# Patient Record
Sex: Female | Born: 1965 | Race: Black or African American | Hispanic: No | Marital: Single | Smoking: Never smoker
Health system: Southern US, Community
[De-identification: ages and names within clinical notes are randomized; demographics above are authoritative.]

## PROBLEM LIST (undated history)

## (undated) DIAGNOSIS — K219 Gastro-esophageal reflux disease without esophagitis: Secondary | ICD-10-CM

## (undated) DIAGNOSIS — I10 Essential (primary) hypertension: Secondary | ICD-10-CM

## (undated) DIAGNOSIS — B029 Zoster without complications: Secondary | ICD-10-CM

## (undated) DIAGNOSIS — E785 Hyperlipidemia, unspecified: Secondary | ICD-10-CM

## (undated) DIAGNOSIS — E119 Type 2 diabetes mellitus without complications: Secondary | ICD-10-CM

## (undated) DIAGNOSIS — T7840XA Allergy, unspecified, initial encounter: Secondary | ICD-10-CM

## (undated) HISTORY — DX: Gastro-esophageal reflux disease without esophagitis: K21.9

## (undated) HISTORY — DX: Essential (primary) hypertension: I10

## (undated) HISTORY — PX: CHOLECYSTECTOMY: SHX55

## (undated) HISTORY — PX: CYST REMOVAL HAND: SHX6279

## (undated) HISTORY — PX: SHOULDER OPEN ROTATOR CUFF REPAIR: SHX2407

## (undated) HISTORY — DX: Hyperlipidemia, unspecified: E78.5

## (undated) HISTORY — PX: LAPAROSCOPIC GASTRIC BANDING: SHX1100

## (undated) HISTORY — DX: Allergy, unspecified, initial encounter: T78.40XA

## (undated) HISTORY — PX: ROTATOR CUFF REPAIR: SHX139

## (undated) HISTORY — PX: HYSTEROTOMY: SHX1776

## (undated) HISTORY — PX: CYST REMOVAL LEG: SHX6280

## (undated) HISTORY — PX: TOTAL ABDOMINAL HYSTERECTOMY: SHX209

## (undated) HISTORY — DX: Zoster without complications: B02.9

## (undated) HISTORY — PX: HERNIA REPAIR: SHX51

## (undated) HISTORY — PX: TUBAL LIGATION: SHX77

---

## 1988-07-16 HISTORY — PX: CHOLECYSTECTOMY: SHX55

## 2008-07-16 HISTORY — PX: LAPAROSCOPIC GASTRIC BANDING: SHX1100

## 2015-07-17 DIAGNOSIS — I639 Cerebral infarction, unspecified: Secondary | ICD-10-CM

## 2015-07-17 HISTORY — DX: Cerebral infarction, unspecified: I63.9

## 2016-07-16 DIAGNOSIS — I639 Cerebral infarction, unspecified: Secondary | ICD-10-CM

## 2016-07-16 HISTORY — DX: Cerebral infarction, unspecified: I63.9

## 2020-04-04 IMAGING — CR DG FEMUR 2+V*L*
4 series · 4 of 4 positions shown · non-contrast
Comparison: None.

CLINICAL DATA: Left leg pain.

EXAM:
LEFT FEMUR 2 VIEWS

[t femur with hip  ap left]
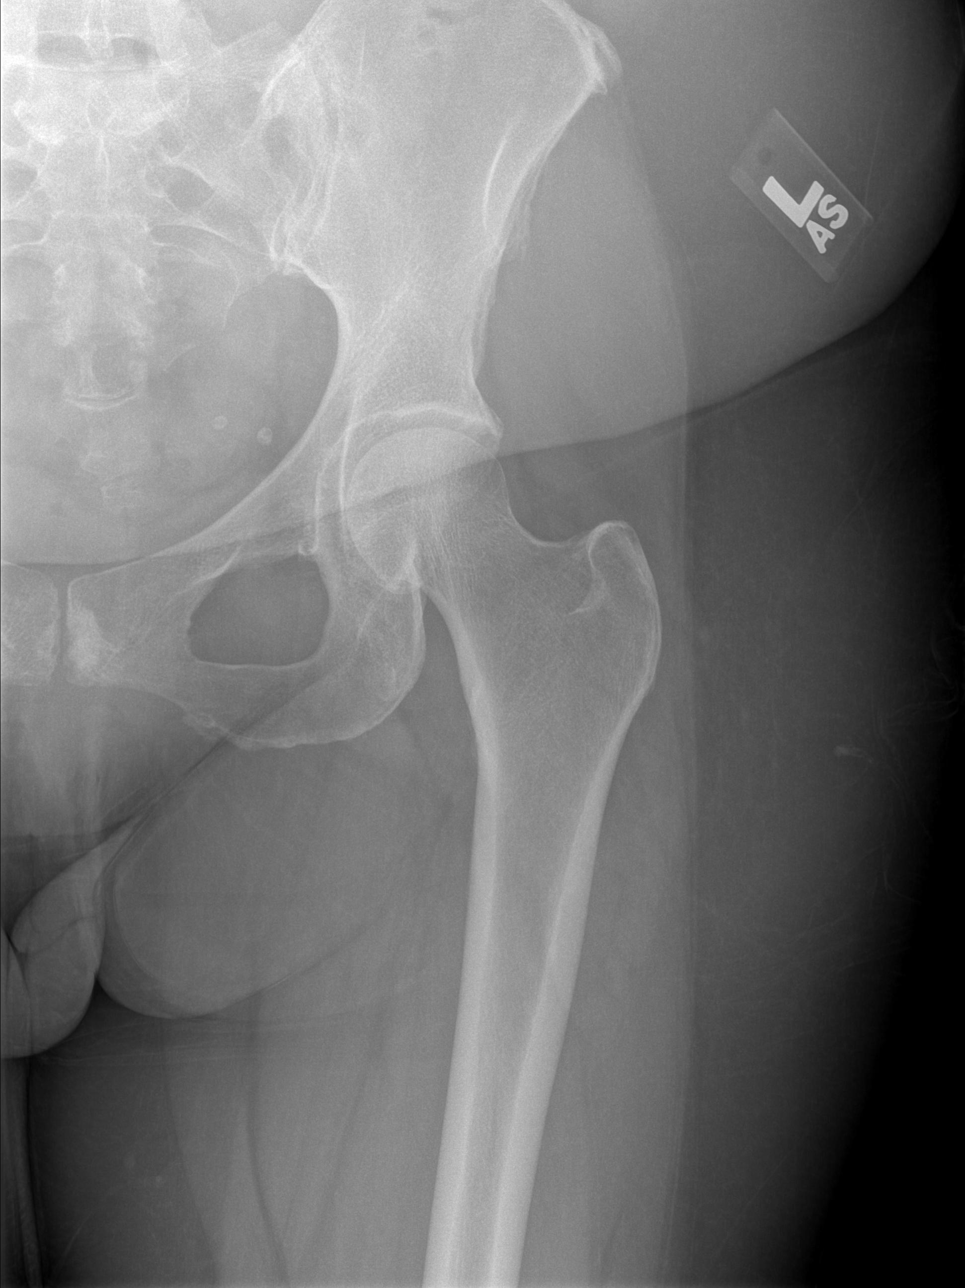

[t femur with knee ap left]
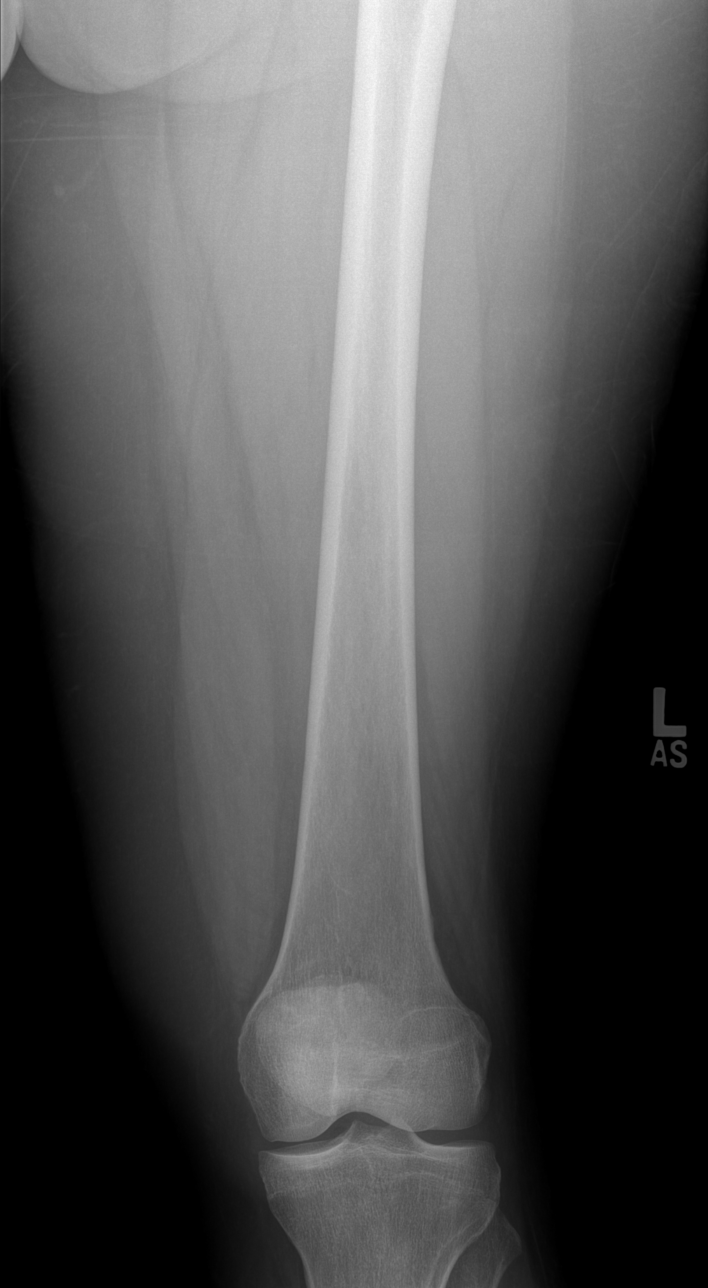

[t femur with hip lat left]
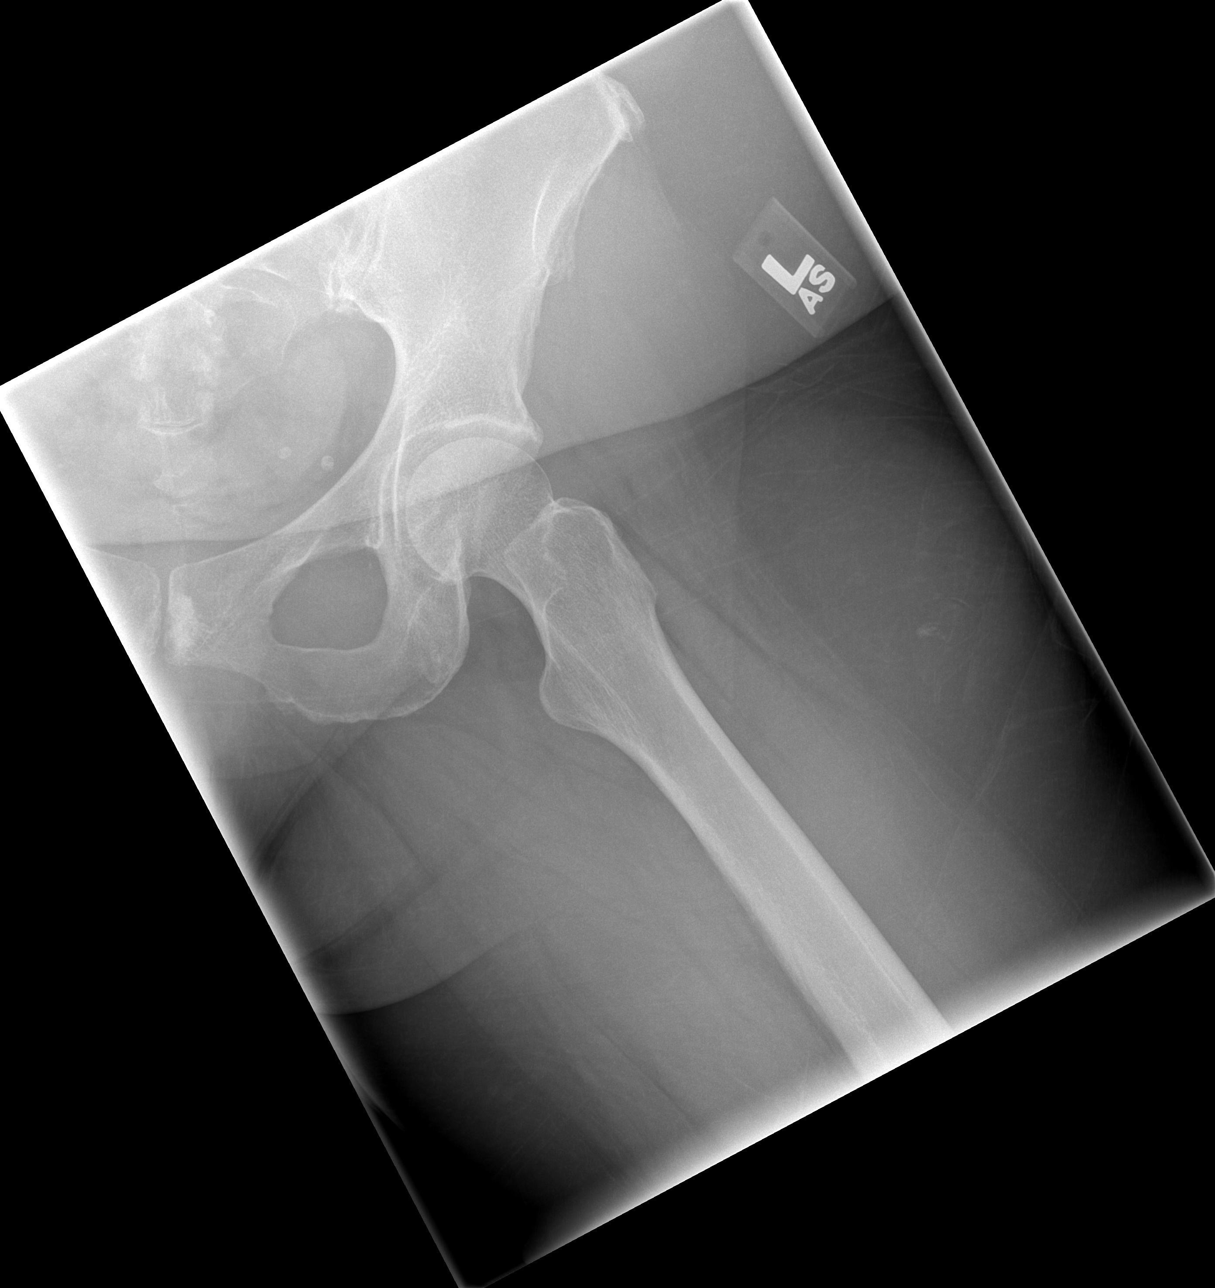

[t femur with knee lat left]
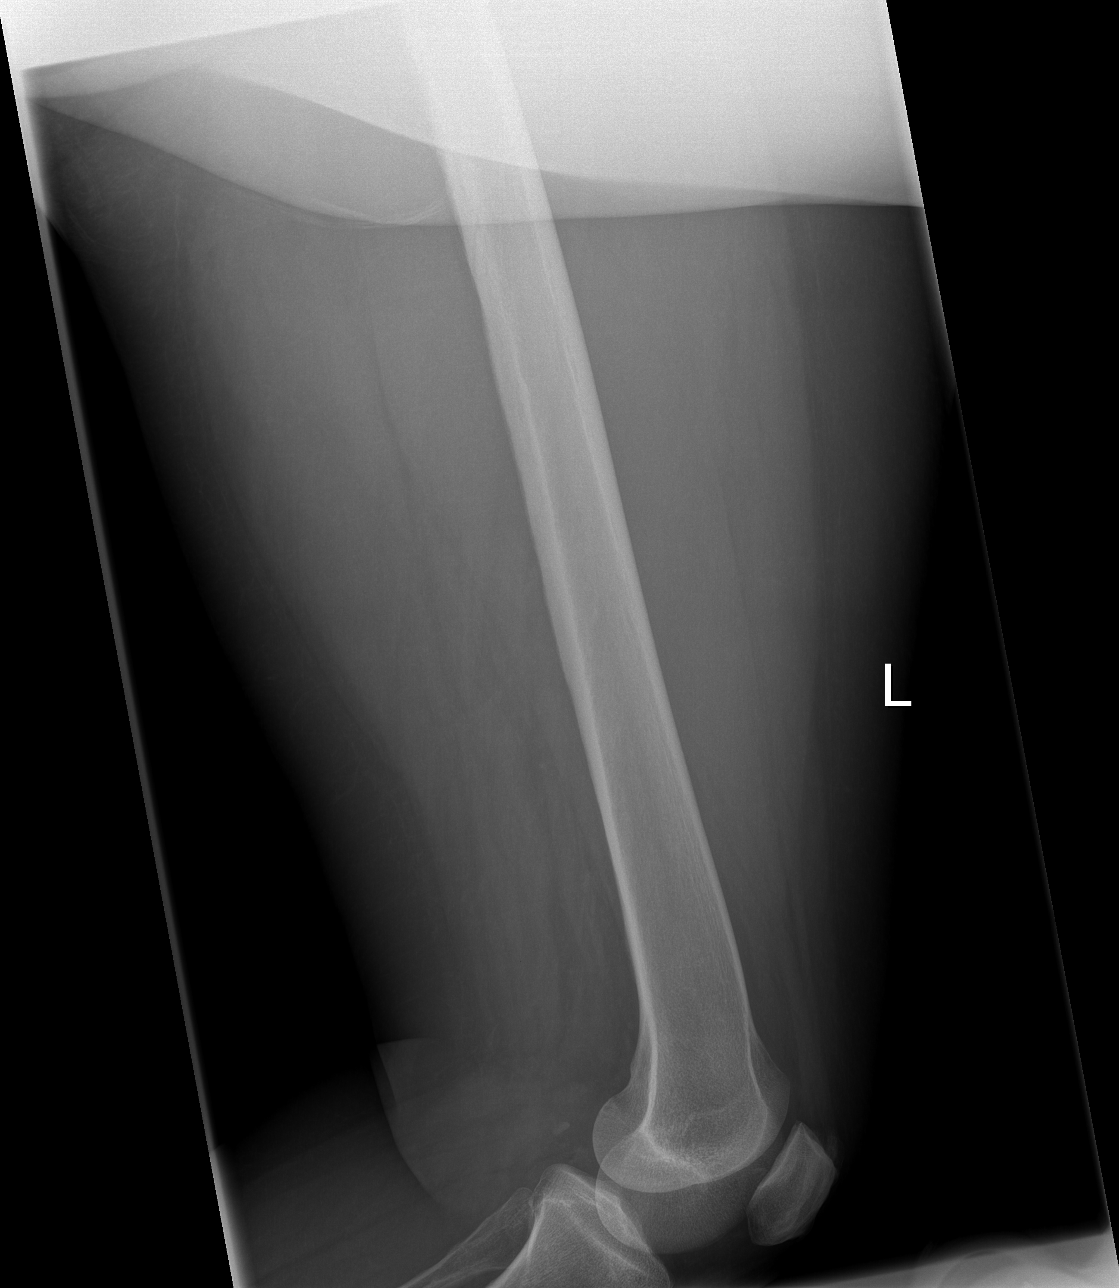

[4 of 4 positions shown; findings below may reference images not displayed]

FINDINGS: There is no evidence of fracture or other focal bone lesions. Soft
tissues are unremarkable.
IMPRESSION: Negative.

## 2020-11-16 NOTE — Progress Notes (Signed)
Patient referred by Lucianne Lei, MD for hypertension  Subjective:   Veronica Lynch, female    DOB: 1965-11-28, 55 y.o.   MRN: [id number]   Chief Complaint  Patient presents with  . Hypertension  . New Patient (Initial Visit)  . Atrial Fibrillation     HPI  55 y.o. African American female with hypertension, mixed hyperlipidemia, h/o stroke (2018), now with exertional chest pain, dyspnea, and palpitations  Patient works as an Web designer at [employer].  She moved from [place] to [place].  Prior to that, she lived in [place].  In 2017, patient had a stroke, fortunately with no significant residual deficit, following a lap band procedure. She was admitted at [facility] and then transferred to [facility] the [facility] at [facility].  More recently, she is experienced retrosternal chest pain and shortness of breath with walking.  She used to walk up to 10 miles a week, but this is down significantly in the last few months due to the above symptoms.  She denies any presyncope syncope, orthopnea, PND.  She has occasional leg edema.  Her sister has noticed that patient's heart rate has been irregular.  Patient had an EKG performed at her PCP office, copy of which is not legible.  I will request another copy.   Past Medical History:  Diagnosis Date  . CVA (cerebral vascular accident) ([place]) 2018  . Hyperlipidemia   . Hypertension      Past Surgical History:  Procedure Laterality Date  . CHOLECYSTECTOMY    . CYST REMOVAL LEG     left knee  . SHOULDER OPEN ROTATOR CUFF REPAIR    . TOTAL ABDOMINAL HYSTERECTOMY       Social History   Tobacco Use  Smoking Status Never Smoker  Smokeless Tobacco Never Used    Social History   Substance and Sexual Activity  Alcohol Use Yes   Comment: occ wine     Family History  Problem Relation Age of Onset  . Cancer Father      Current Outpatient Medications on File Prior to Visit   Medication Sig Dispense Refill  . Ascorbic Acid (VITAMIN C) 1000 MG tablet Take 1,000 mg by mouth daily.    . Biotin 10 MG TABS Take by mouth daily at 6 (six) AM.    . Cholecalciferol (VITAMIN D3) 125 MCG (5000 UT) CAPS Take by mouth daily at 6 (six) AM.    . telmisartan-hydrochlorothiazide (MICARDIS HCT) 40-12.5 MG tablet Take 1 tablet by mouth every morning.     No current facility-administered medications on file prior to visit.    Cardiovascular and other pertinent studies:  EKG 11/17/2020: Sinus tachycardia 104 bpm  Recent labs: 10/22/2020: Glucose 113, BUN/Cr 11/0.70. EGFR 98. Na/K 141/4.1. Rest of the CMP normal H/H 12.5/37.8 MCV 85.7. Platelets 246 HbA1C 7.0% Chol 247, TG 184, HDL 57, LDL 156 TSH 0.92 normal    Review of Systems  Cardiovascular: Positive for chest pain, dyspnea on exertion and palpitations. Negative for leg swelling and syncope.         Vitals:   11/17/20 0912  BP: (!) 141/69  Pulse: (!) 104  Resp: 16  Temp: 98 F (36.7 C)  SpO2: 99%     Body mass index is 33.57 kg/m. Filed Weights   11/17/20 0912  Weight: 208 lb (94.3 kg)     Objective:   Physical Exam Vitals and nursing note reviewed.  Constitutional:  General: She is not in acute distress. Neck:     Vascular: No JVD.  Cardiovascular:     Rate and Rhythm: Regular rhythm. Tachycardia present.     Heart sounds: Normal heart sounds. No murmur heard.   Pulmonary:     Effort: Pulmonary effort is normal.     Breath sounds: Normal breath sounds. No wheezing or rales.  Musculoskeletal:     Right lower leg: Edema (Trace) present.     Left lower leg: Edema (Trace) present.         Assessment & Recommendations:   55 y.o. African American female with hypertension, mixed hyperlipidemia, h/o stroke (2018), now with exertional chest pain, dyspnea, and palpitations  Exertional chest pain, dyspnea: Symptoms concerning for angina.  Will obtain exercise nuclear stress test and  echocardiogram. Patient takes aspirin 325 mg and stroke, continue for now. She is reluctant to start statin therapy at this time. Prescribed sublingual nitroglycerin for as needed use.  Palpitations: Recommend 1 week cardiac telemetry  H/o stroke: We will obtain records from [place]  Mixed hyperlipidemia: Does not want to take statin at this time.   Thank you for referring the patient to [place]. Please feel free to contact with any questions.   Nigel Mormon, MD Pager: [phone] Office: [phone]

## 2020-11-17 ENCOUNTER — Encounter: Payer: Self-pay | Admitting: Cardiology

## 2020-11-17 ENCOUNTER — Ambulatory Visit: Payer: BC Managed Care – PPO | Admitting: Cardiology

## 2020-11-17 ENCOUNTER — Other Ambulatory Visit: Payer: Self-pay

## 2020-11-17 VITALS — BP 143/58 | HR 103 | Temp 98.0°F | Resp 16 | Ht 66.0 in | Wt 208.0 lb

## 2020-11-17 DIAGNOSIS — R0600 Dyspnea, unspecified: Secondary | ICD-10-CM

## 2020-11-17 DIAGNOSIS — R079 Chest pain, unspecified: Secondary | ICD-10-CM

## 2020-11-17 DIAGNOSIS — E782 Mixed hyperlipidemia: Secondary | ICD-10-CM | POA: Insufficient documentation

## 2020-11-17 DIAGNOSIS — I10 Essential (primary) hypertension: Secondary | ICD-10-CM

## 2020-11-17 DIAGNOSIS — R002 Palpitations: Secondary | ICD-10-CM

## 2020-11-17 DIAGNOSIS — R0609 Other forms of dyspnea: Secondary | ICD-10-CM | POA: Insufficient documentation

## 2020-11-17 HISTORY — DX: Other forms of dyspnea: R06.09

## 2020-11-17 HISTORY — DX: Chest pain, unspecified: R07.9

## 2020-11-17 HISTORY — DX: Palpitations: R00.2

## 2020-11-17 MED ORDER — NITROGLYCERIN 0.4 MG SL SUBL: 0.4000 mg | SUBLINGUAL_TABLET | SUBLINGUAL | 3 refills | Status: DC | PRN

## 2020-11-21 ENCOUNTER — Other Ambulatory Visit: Payer: Self-pay

## 2020-11-21 ENCOUNTER — Ambulatory Visit: Payer: BC Managed Care – PPO

## 2020-11-21 DIAGNOSIS — R079 Chest pain, unspecified: Secondary | ICD-10-CM

## 2020-11-21 DIAGNOSIS — R0600 Dyspnea, unspecified: Secondary | ICD-10-CM

## 2020-11-21 DIAGNOSIS — R9439 Abnormal result of other cardiovascular function study: Secondary | ICD-10-CM

## 2020-11-21 DIAGNOSIS — R0609 Other forms of dyspnea: Secondary | ICD-10-CM

## 2020-11-23 ENCOUNTER — Inpatient Hospital Stay: Payer: BC Managed Care – PPO

## 2020-11-23 ENCOUNTER — Other Ambulatory Visit: Payer: Self-pay

## 2020-11-23 ENCOUNTER — Ambulatory Visit: Payer: BC Managed Care – PPO

## 2020-11-23 DIAGNOSIS — R0600 Dyspnea, unspecified: Secondary | ICD-10-CM

## 2020-11-23 DIAGNOSIS — R0609 Other forms of dyspnea: Secondary | ICD-10-CM

## 2020-11-23 DIAGNOSIS — R002 Palpitations: Secondary | ICD-10-CM

## 2020-11-23 DIAGNOSIS — R079 Chest pain, unspecified: Secondary | ICD-10-CM

## 2020-11-24 MED ORDER — ASPIRIN EC 81 MG PO TBEC: 81.0000 mg | DELAYED_RELEASE_TABLET | Freq: Every day | ORAL | 3 refills | Status: DC

## 2020-11-24 MED ORDER — METOPROLOL SUCCINATE ER 25 MG PO TB24: 25.0000 mg | ORAL_TABLET | Freq: Every day | ORAL | 2 refills | Status: DC

## 2020-11-24 NOTE — Progress Notes (Signed)
Reviewed external records  11/2016 Acute CVA s/p TPA  MRI brain 11/2016:  Normal  CTA head/neck 11/2016: No carotid stenosis Small thrombus noted in Rt M2 segment, too small for safe retrieval  Echocardiogram 11/2016: Normal LV size. LVEF 55-65%. Mild LVH. Normal diastolic function.  Reviewed stress test results with the patient. Recommend medical therapy.  Continue Aspirin, at 81 mg rather than 325 mg. Start metoprolol succinate 25 mg daily. Use SL NTG> She is reluctant to start statin at this time.  Keep f/u on 12/16/2020.   Elder Negus, MD

## 2020-11-28 ENCOUNTER — Ambulatory Visit
Admission: RE | Admit: 2020-11-28 | Discharge: 2020-11-28 | Disposition: A | Discharge status: Home / Self Care | Payer: No Typology Code available for payment source | Source: Ambulatory Visit | Attending: Cardiology | Admitting: Cardiology

## 2020-11-28 ENCOUNTER — Other Ambulatory Visit: Payer: BC Managed Care – PPO

## 2020-11-28 DIAGNOSIS — R079 Chest pain, unspecified: Secondary | ICD-10-CM

## 2020-11-28 DIAGNOSIS — R0600 Dyspnea, unspecified: Secondary | ICD-10-CM

## 2020-11-28 DIAGNOSIS — R0609 Other forms of dyspnea: Secondary | ICD-10-CM

## 2020-11-28 IMAGING — CT CT CARDIAC CORONARY ARTERY CALCIUM SCORE
3 series · 14 of 20 positions shown, 15 images · non-contrast
Comparison: None.

CLINICAL DATA: 54-year-old African-American female with history of
hyperlipidemia and hypertension.

EXAM:
CT CARDIAC CORONARY ARTERY CALCIUM SCORE
TECHNIQUE: Non-contrast imaging through the heart was performed using
prospective ECG gating. Image post processing was performed on an
independent workstation, allowing for quantitative analysis of the
heart and coronary arteries. Note that this exam targets the heart
and the chest was not imaged in its entirety.

[Series 2: calcium scoring 2.00 qr36 bestdiast 71% hrt calciu · axial · 0.39mm/px · z∈[+1819,+1879]mm · 4 of 52 slices shown, 5 images]
[im 11/52  vessel]
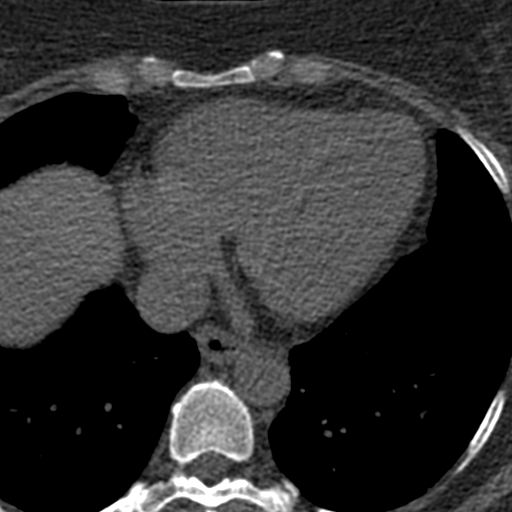
[im 11/52  lung]
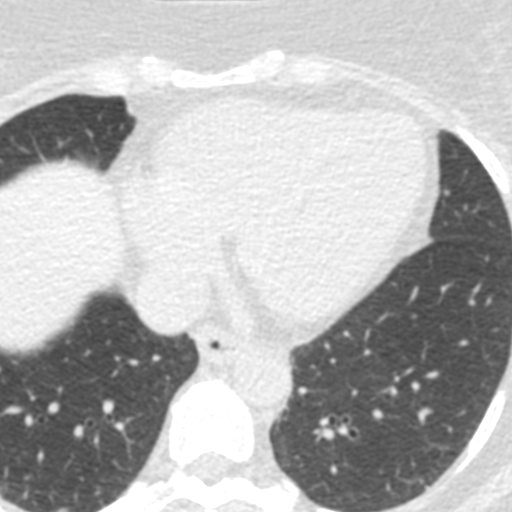
[im 21/52  vessel]
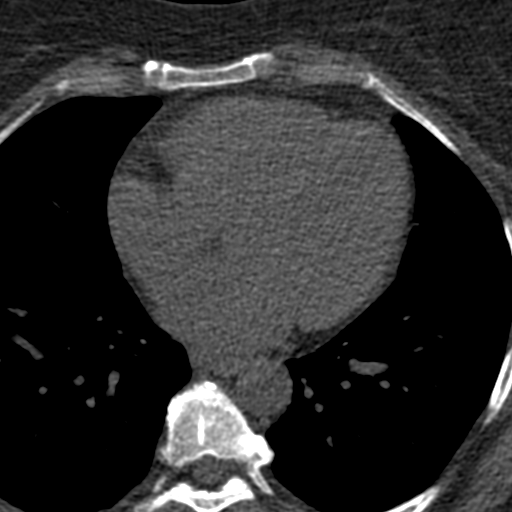
[im 31/52  vessel]
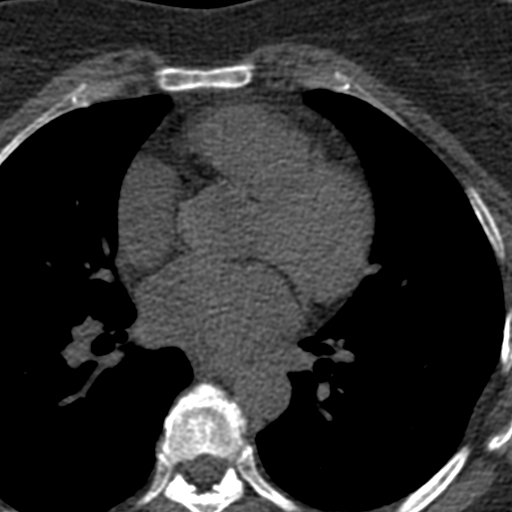
[im 41/52  vessel]
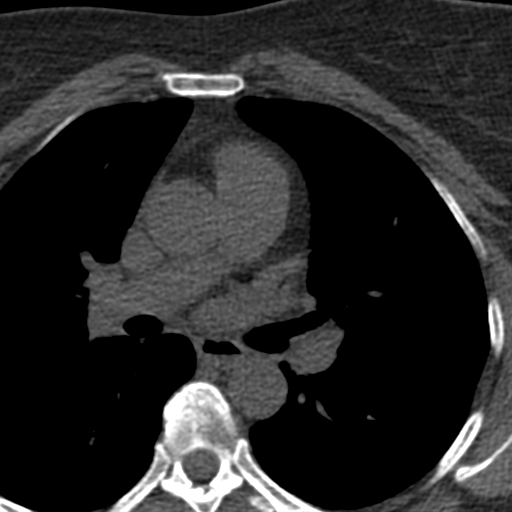

[Series 3: calcium scoring 2.00 br40 bestdiast 71% axial · axial · 0.55mm/px · z∈[+1819,+1883]mm · 5 of 50 slices shown]
[im 9/50  vessel]
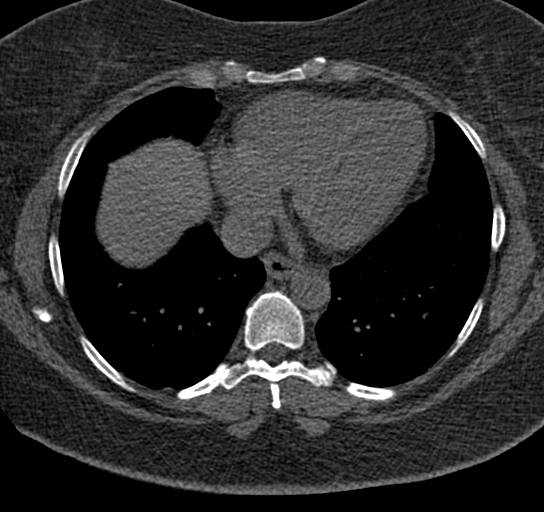
[im 17/50  vessel]
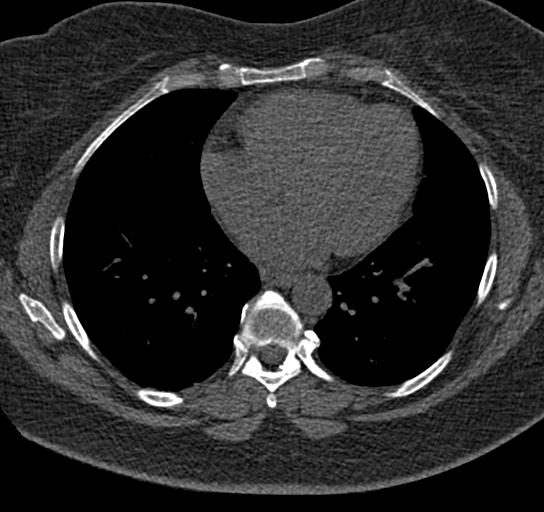
[im 25/50  vessel]
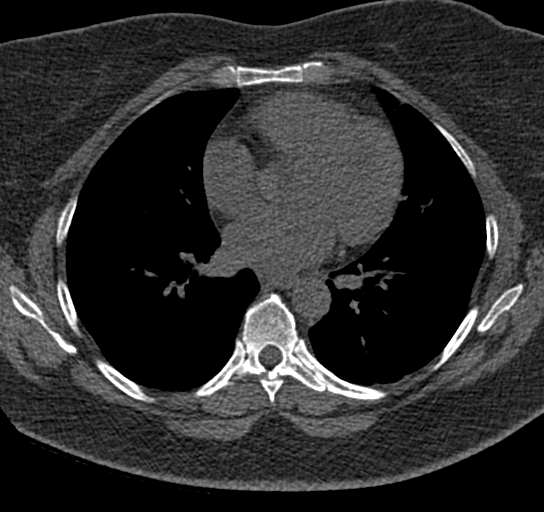
[im 33/50  vessel]
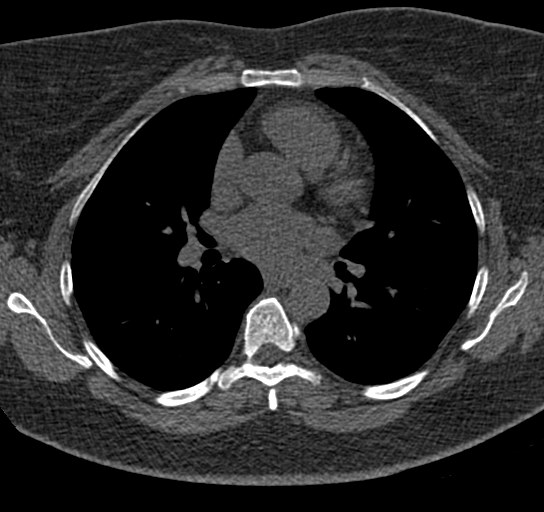
[im 41/50  vessel]
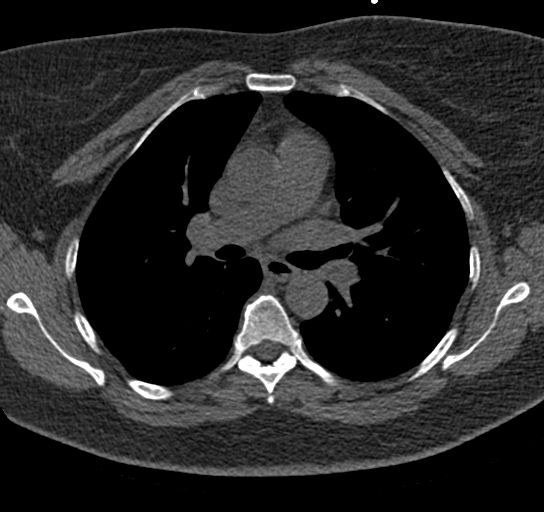

[Series 9: calcium scoring 2.00 br60 bestdiast 71% lungs · axial · 0.53mm/px · z∈[+1819,+1883]mm · 5 of 50 slices shown]
[im 9/50  vessel]
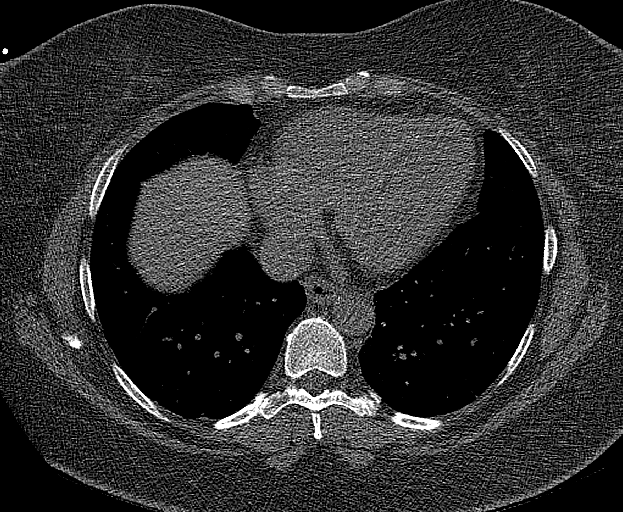
[im 17/50  vessel]
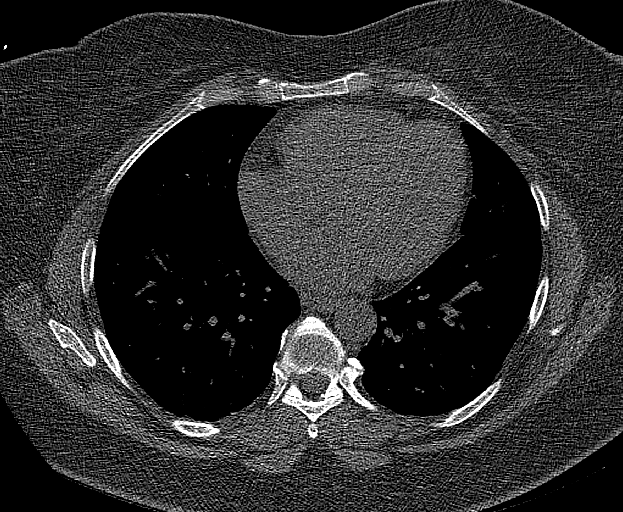
[im 25/50  vessel]
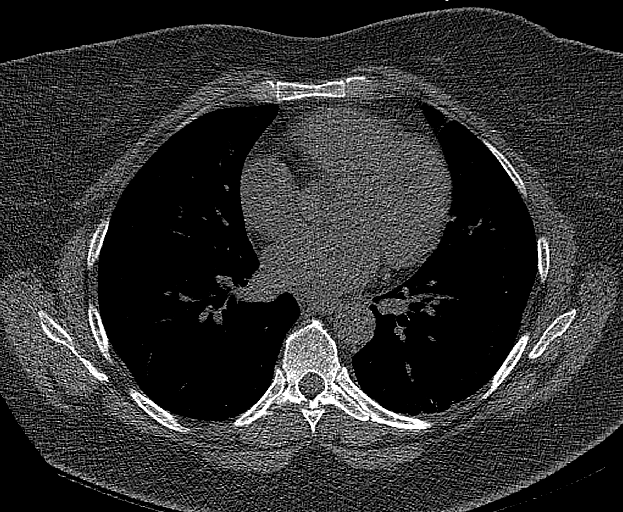
[im 33/50  vessel]
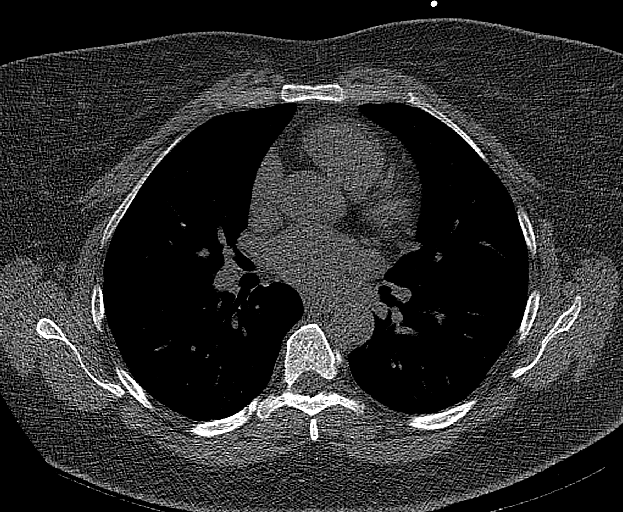
[im 41/50  vessel]
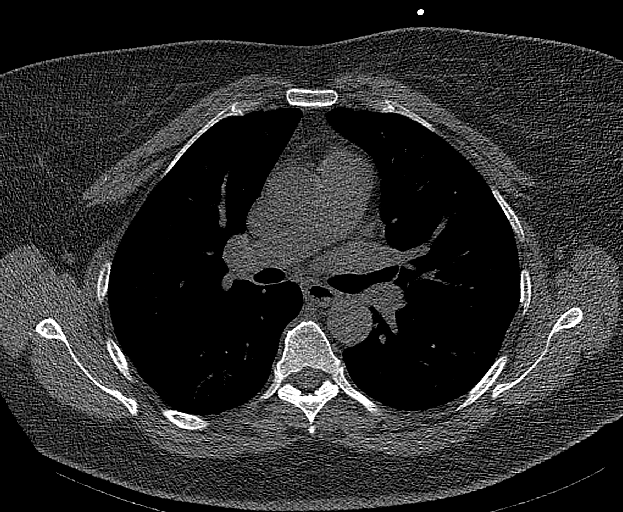

[14 of 20 positions shown; findings below may reference images not displayed]

FINDINGS: CORONARY CALCIUM SCORES:

Left Main: 0

LAD: 11

LCx:

RCA: 0

Total Agatston Score:

[HOSPITAL] percentile: 88

AORTA MEASUREMENTS:

Ascending Aorta: 31 mm

Descending Aorta: 24 mm

OTHER FINDINGS:

The visualized trachea and esophagus are within normal limits. No
mediastinal lymphadenopathy. The heart is normal in size. The
visualized lung fields are clear. No suspicious pulmonary nodules.
No acute osseous abnormality or aggressive appearing osseous lesion.
IMPRESSION: Focal atherosclerotic calcifications about the proximal left
anterior descending and left circumflex coronary arteries. The
observed calcium score of 21.2 is at the eighty-eighth percentile
for subjects of the same age, gender, and race/ethnicity who are
free of clinical cardiovascular disease and treated diabetes.

## 2020-12-16 ENCOUNTER — Other Ambulatory Visit: Payer: Self-pay

## 2020-12-16 ENCOUNTER — Encounter: Payer: Self-pay | Admitting: Cardiology

## 2020-12-16 ENCOUNTER — Other Ambulatory Visit: Payer: Self-pay | Admitting: Cardiology

## 2020-12-16 ENCOUNTER — Ambulatory Visit: Payer: BC Managed Care – PPO | Admitting: Cardiology

## 2020-12-16 VITALS — BP 120/67 | HR 96 | Temp 97.4°F | Resp 16 | Ht 66.0 in | Wt 209.4 lb

## 2020-12-16 DIAGNOSIS — I25118 Atherosclerotic heart disease of native coronary artery with other forms of angina pectoris: Secondary | ICD-10-CM

## 2020-12-16 DIAGNOSIS — R079 Chest pain, unspecified: Secondary | ICD-10-CM

## 2020-12-16 DIAGNOSIS — E782 Mixed hyperlipidemia: Secondary | ICD-10-CM

## 2020-12-16 DIAGNOSIS — Z789 Other specified health status: Secondary | ICD-10-CM

## 2020-12-16 MED ORDER — REPATHA 140 MG/ML ~~LOC~~ SOSY: 140.0000 mg | PREFILLED_SYRINGE | SUBCUTANEOUS | 3 refills | Status: DC

## 2020-12-16 MED ORDER — METOPROLOL SUCCINATE ER 25 MG PO TB24: 25.0000 mg | ORAL_TABLET | Freq: Every day | ORAL | 3 refills | Status: DC

## 2020-12-16 NOTE — Progress Notes (Signed)
Patient referred by Lucianne Lei, MD for hypertension  Subjective:   Veronica Lynch, female    DOB: 1965/08/28, 55 y.o.   MRN: [id number]   Chief Complaint  Patient presents with  . Chest Pain  . Hypertension  . Follow-up    4 weeks     Chest Pain  Associated symptoms include palpitations. Pertinent negatives include no syncope.  Her past medical history is significant for hypertension.  Hypertension Associated symptoms include chest pain and palpitations.    55 y.o. African American female with hypertension, mixed hyperlipidemia, h/o stroke (2018), CAD with stable angina  Patient has had improvement in her angina symptoms with metoprolol. Discussed echocardiogram, stress test, CT cardiac scoring, cardiac telemetry results with the patient, details below.   Initial consultation HPI 11/2020: Patient works as an Web designer at [employer].  She moved from [place] to [place].  Prior to that, she lived in [place].  In 2017, patient had a stroke, fortunately with no significant residual deficit, following a lap band procedure. She was admitted at [facility] and then transferred to [facility] of the [facility].  More recently, she is experienced retrosternal chest pain and shortness of breath with walking.  She used to walk up to 10 miles a week, but this is down significantly in the last few months due to the above symptoms.  She denies any presyncope syncope, orthopnea, PND.  She has occasional leg edema.  Her sister has noticed that patient's heart rate has been irregular.  Patient had an EKG performed at her PCP office, copy of which is not legible.  I will request another copy.     Current Outpatient Medications on File Prior to Visit  Medication Sig Dispense Refill  . Ascorbic Acid (VITAMIN C) 1000 MG tablet Take 1,000 mg by mouth daily.    Marland Kitchen aspirin EC 81 MG tablet Take 1 tablet (81 mg total) by mouth daily. Swallow whole.  90 tablet 3  . Biotin 10 MG TABS Take by mouth daily at 6 (six) AM.    . Cholecalciferol (VITAMIN D3) 125 MCG (5000 UT) CAPS Take by mouth daily at 6 (six) AM.    . metoprolol succinate (TOPROL-XL) 25 MG 24 hr tablet Take 1 tablet (25 mg total) by mouth daily. Take with or immediately following a meal. 30 tablet 2  . nitroGLYCERIN (NITROSTAT) 0.4 MG SL tablet Place 1 tablet (0.4 mg total) under the tongue every 5 (five) minutes as needed for chest pain. 90 tablet 3  . telmisartan-hydrochlorothiazide (MICARDIS HCT) 40-12.5 MG tablet Take 1 tablet by mouth every morning. (Patient not taking: Reported on 12/16/2020)     No current facility-administered medications on file prior to visit.    Cardiovascular and other pertinent studies:  CT Cardiac scoring 11/28/2020: LM: 0 LAD: 11 LCx: 10 RCA: 0 Total score: 21 (88th percentile)  Mobile cardiac telemetry 5 days 11/23/2020 - 11/28/2020: Dominant rhythm: Sinus. HR 60-135 bpm. Avg HR 91 bpm. 0 episodes of SVT 3.3% isolated SVE, 1.7% couplets, <1%triplets. 0 episodes of VT <% isolated VE, no couplet/triplets. No atrial fibrillation/atrial flutter/SVT/VT/high grade AV block, sinus pause >3sec noted. 0 patient triggered events.   Echocardiogram 11/23/2020:  Normal LV systolic function with visual EF 60-65%. Left ventricle cavity  is normal in size. Normal global wall motion. Normal diastolic filling  pattern, normal LAP.  Trace tricuspid regurgitation. No evidence of pulmonary hypertension.  No prior study for comparison.  Exercise Sestamibi  stress test 11/21/2020: Exercise nuclear stress test was performed using Bruce protocol. Patient reached 7.3 METS, and 82% of age predicted maximum heart rate. Exercise capacity was low. No chest pain reported. Exertional dyspnea and dizziness reported. Heart rate and hemodynamic response were normal. Stress EKG revealed no ischemic changes. SPECT images showed small size, mild intensity, reversible perfusion  defect in apical to basal, inferior myocardium.  Stress LVEF 62%. Low risk study.  11/2016 Acute CVA s/p TPA  MRI brain 11/2016:  Normal  CTA head/neck 11/2016: No carotid stenosis Small thrombus noted in Rt M2 segment, too small for safe retrieval  Echocardiogram 11/2016: Normal LV size. LVEF 55-65%. Mild LVH. Normal diastolic function.   Recent labs: 10/22/2020: Glucose 113, BUN/Cr 11/0.70. EGFR 98. Na/K 141/4.1. Rest of the CMP normal H/H 12.5/37.8 MCV 85.7. Platelets 246 HbA1C 7.0% Chol 247, TG 184, HDL 57, LDL 156 TSH 0.92 normal    Review of Systems  Cardiovascular: Positive for chest pain, dyspnea on exertion and palpitations. Negative for leg swelling and syncope.         Vitals:   12/16/20 1320  BP: 120/67  Pulse: 96  Resp: 16  Temp: (!) 97.4 F (36.3 C)  SpO2: 99%     Body mass index is 33.8 kg/m. Filed Weights   12/16/20 1320  Weight: 209 lb 6.4 oz (95 kg)     Objective:   Physical Exam Vitals and nursing note reviewed.  Constitutional:      General: She is not in acute distress. Neck:     Vascular: No JVD.  Cardiovascular:     Rate and Rhythm: Regular rhythm. Tachycardia present.     Heart sounds: Normal heart sounds. No murmur heard.   Pulmonary:     Effort: Pulmonary effort is normal.     Breath sounds: Normal breath sounds. No wheezing or rales.  Musculoskeletal:     Right lower leg: No edema.     Left lower leg: No edema.         Assessment & Recommendations:   55 y.o. African American female with hypertension, mixed hyperlipidemia, h/o stroke (2018), CAD with stable angina  CAD: Calcium score 21. Mild ischemia on stress testing (11/2020). Symptoms improved with metoprolol. Continue medical management. Continue Aspirin 81 mg daily. She has not tolerated Crestor and lipitor due to myalgias. Given her CAD and mixed hyperlipidemia. Recommend Repatha.  Thank you for referring the patient to [place]. Please feel free to contact  with any questions.   Nigel Mormon, MD Pager: [phone] Office: [phone]

## 2020-12-17 ENCOUNTER — Encounter: Payer: Self-pay | Admitting: Cardiology

## 2020-12-17 DIAGNOSIS — Z789 Other specified health status: Secondary | ICD-10-CM | POA: Insufficient documentation

## 2020-12-17 DIAGNOSIS — I25118 Atherosclerotic heart disease of native coronary artery with other forms of angina pectoris: Secondary | ICD-10-CM | POA: Insufficient documentation

## 2021-02-06 ENCOUNTER — Other Ambulatory Visit: Payer: Self-pay | Admitting: Family Medicine

## 2021-02-06 ENCOUNTER — Ambulatory Visit
Admission: RE | Admit: 2021-02-06 | Discharge: 2021-02-06 | Disposition: A | Discharge status: Home / Self Care | Payer: BC Managed Care – PPO | Source: Ambulatory Visit | Attending: Family Medicine | Admitting: Family Medicine

## 2021-02-06 DIAGNOSIS — M545 Low back pain, unspecified: Secondary | ICD-10-CM

## 2021-02-06 IMAGING — CR DG LUMBAR SPINE COMPLETE 4+V
5 series · 5 of 5 positions shown · non-contrast
Comparison: None.

CLINICAL DATA: Chronic lower back pain.

EXAM:
LUMBAR SPINE - COMPLETE 4+ VIEW

[t l-spine a.p.]
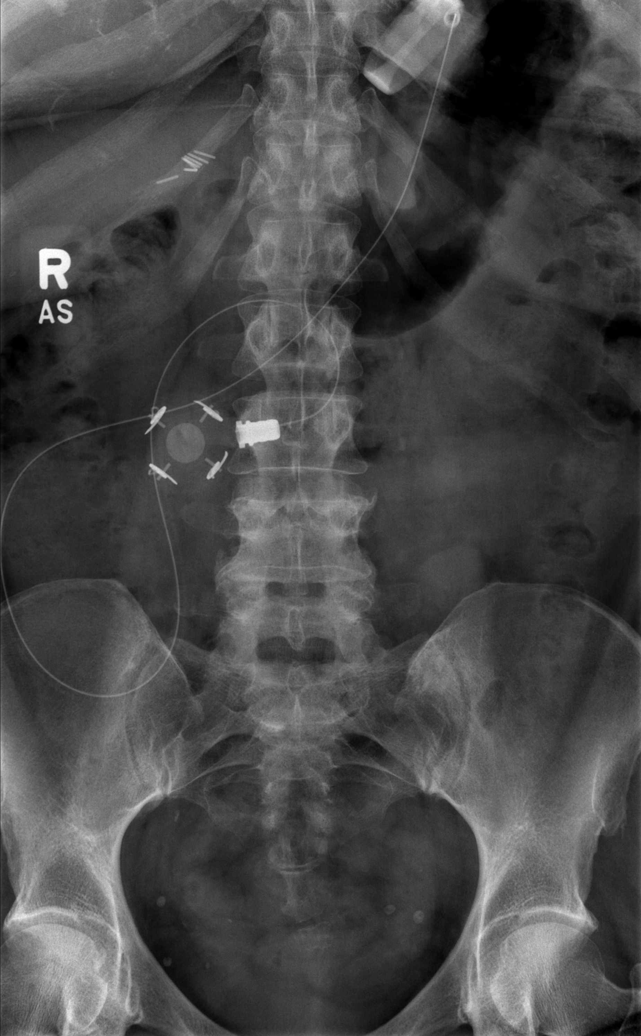

[t l-spine oblique exposure (1 of 2)]
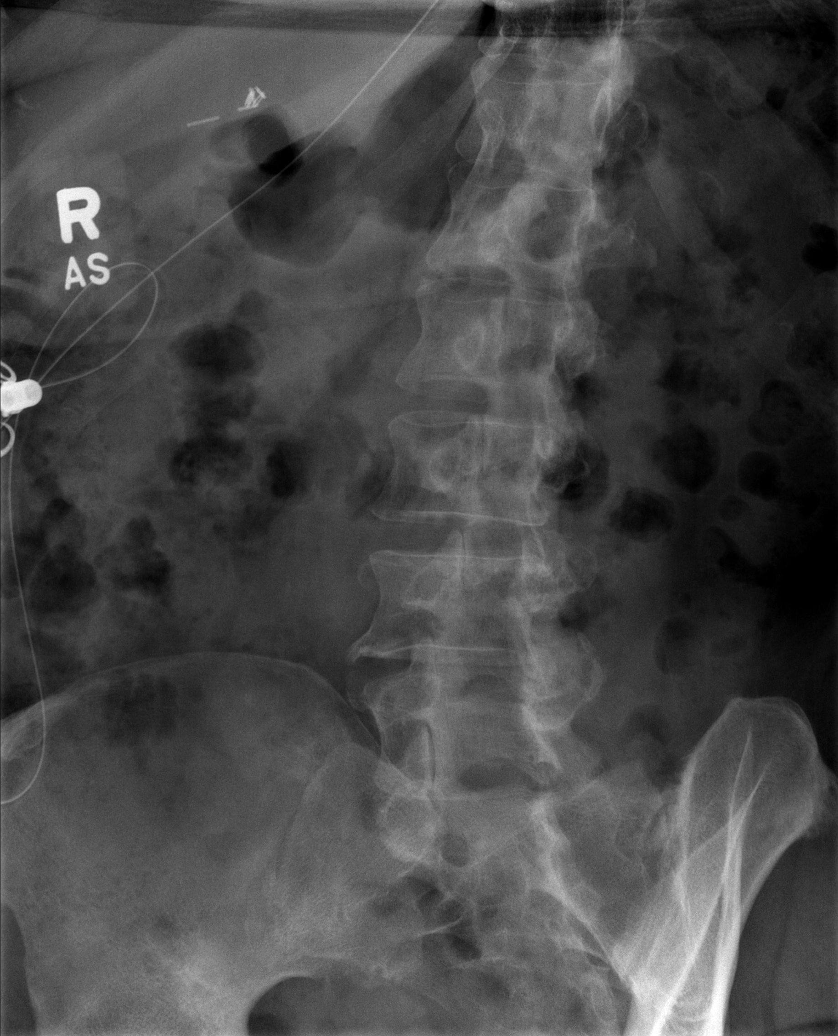

[t l-spine oblique exposure (2 of 2)]
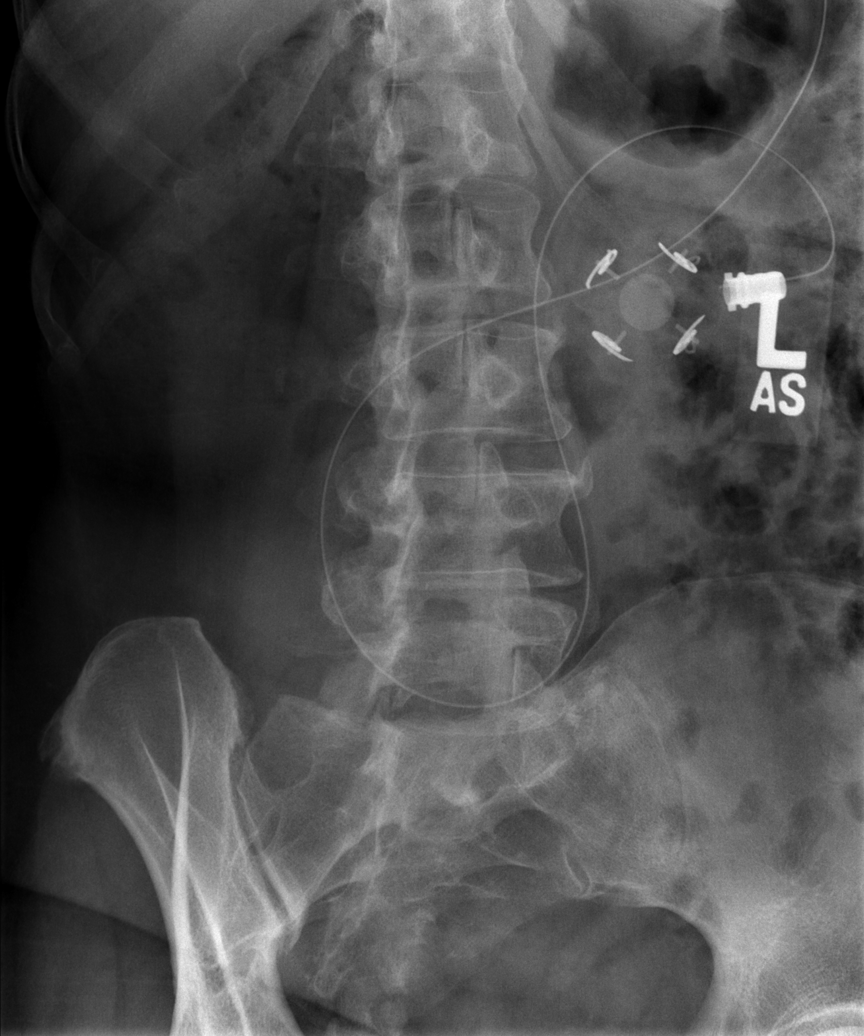

[t l-spine lat]
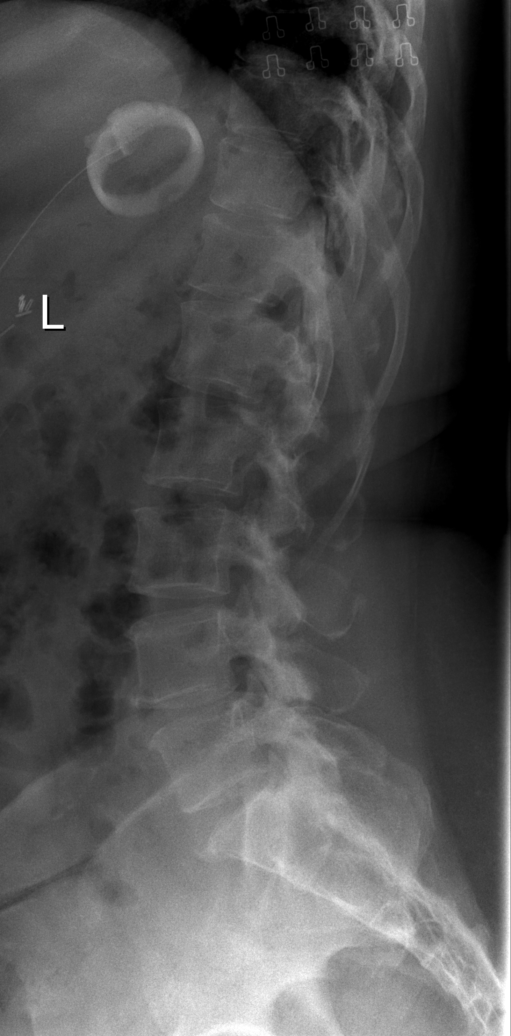

[t l-spine l5-s1 spot]
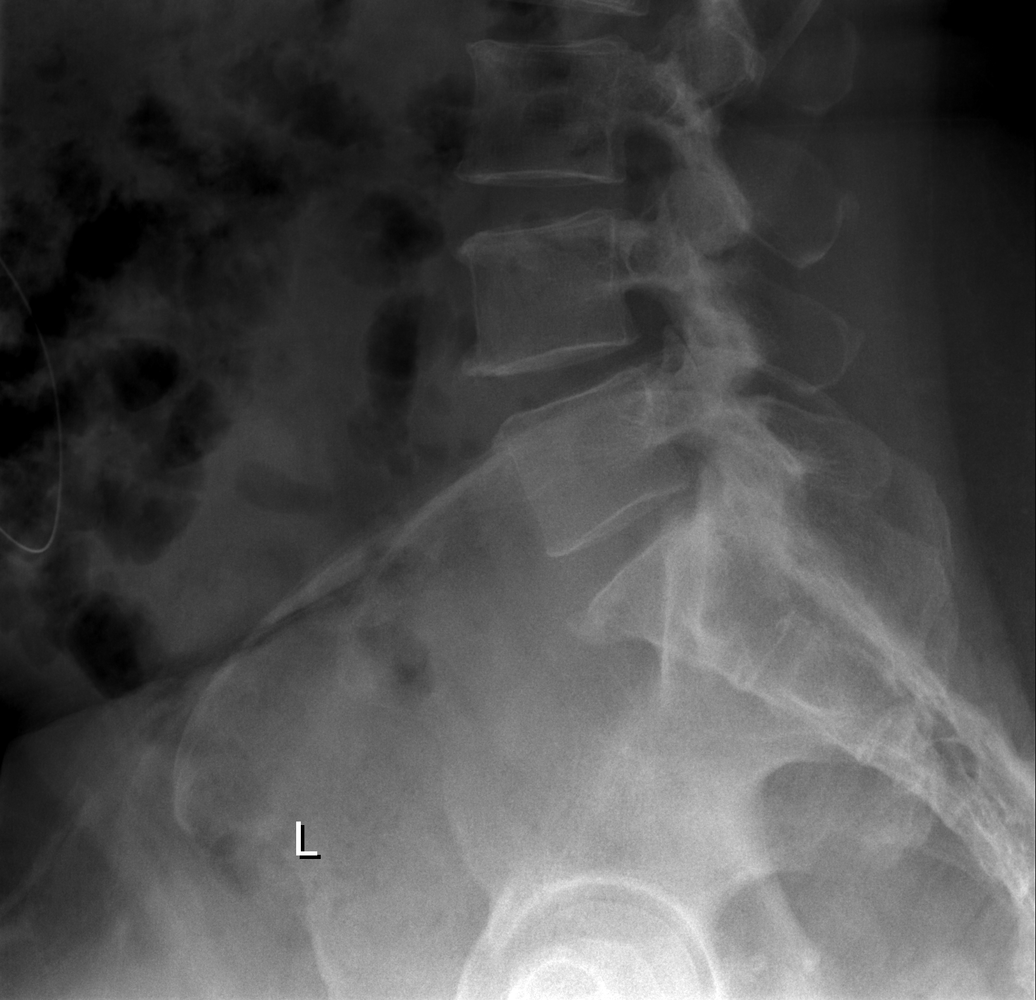

[5 of 5 positions shown; findings below may reference images not displayed]

FINDINGS: There is no evidence of lumbar spine fracture. Alignment is normal.
Intervertebral disc spaces are maintained. Minimal anterior
osteophyte formation is noted at L3-4 and L4-5.
IMPRESSION: Minimal multilevel degenerative changes are noted. No acute
abnormality seen.

## 2021-02-06 IMAGING — CR DG FEMUR 2+V*R*
4 series · 4 of 4 positions shown · non-contrast
Comparison: None.

CLINICAL DATA: Right leg pain.

EXAM:
RIGHT FEMUR 2 VIEWS

[t femur with hip  ap right]
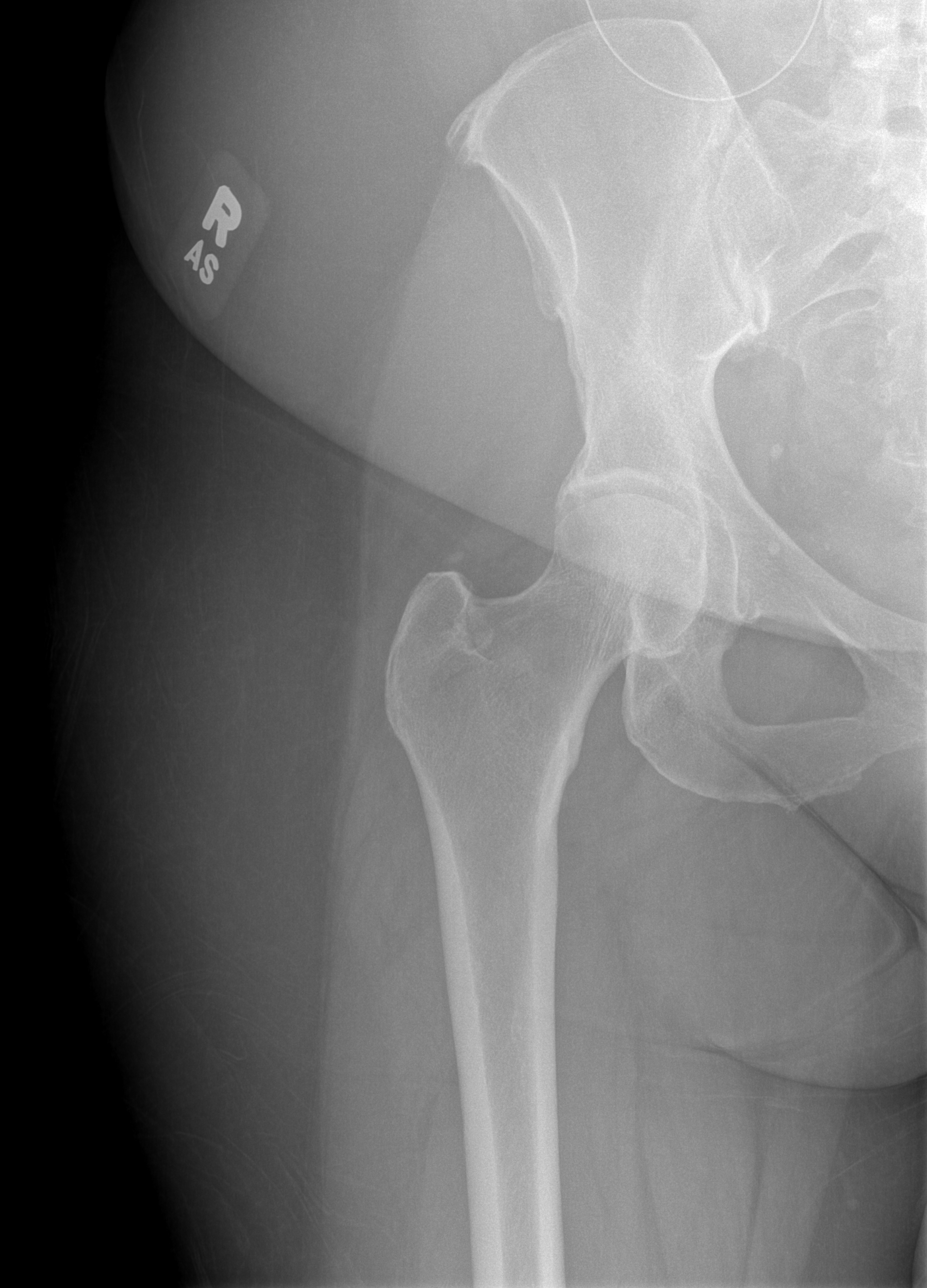

[t femur with knee ap right]
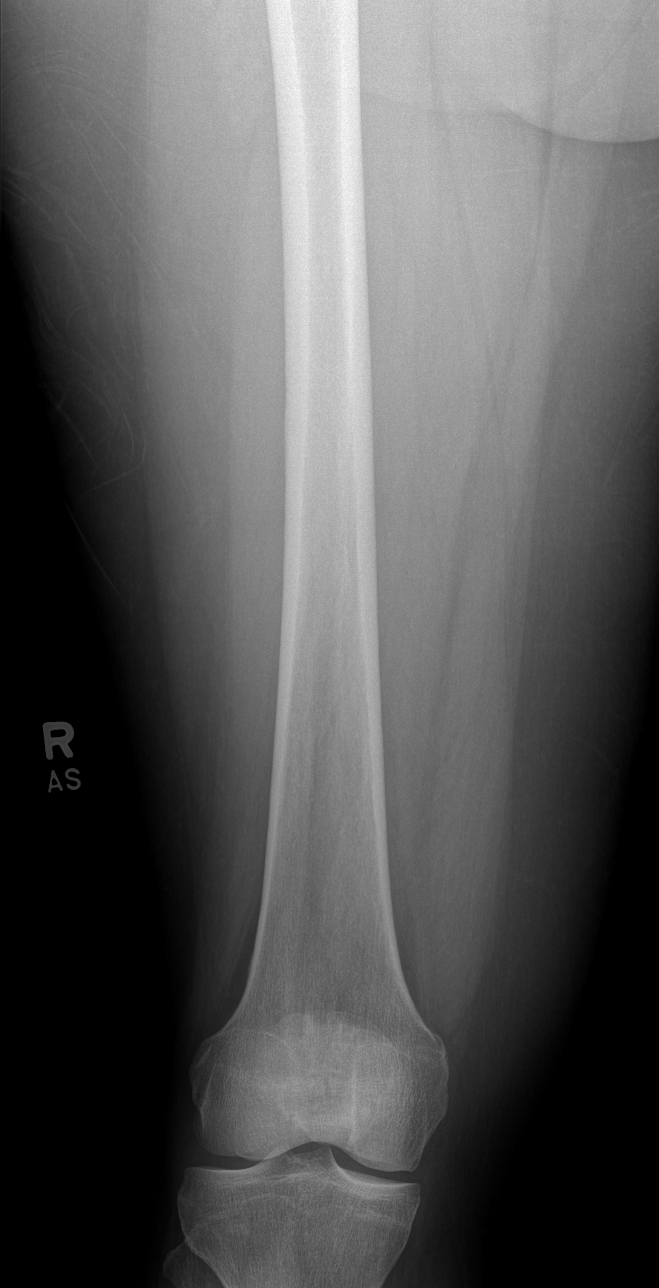

[t femur with hip lat right]
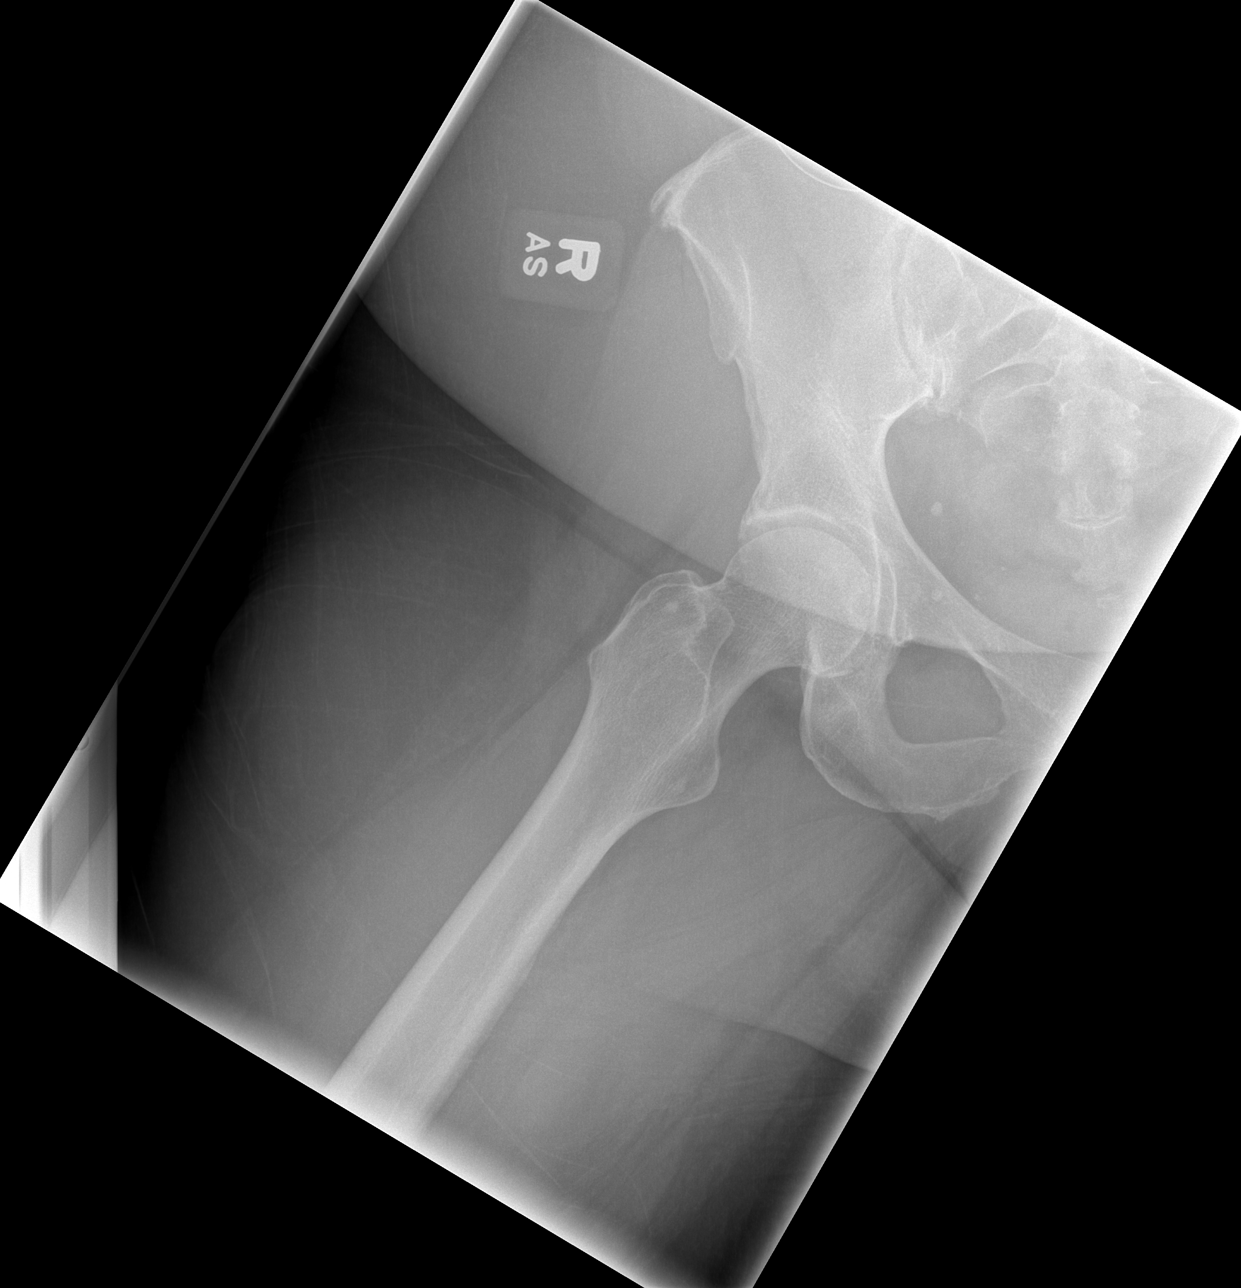

[t femur with knee lat right]
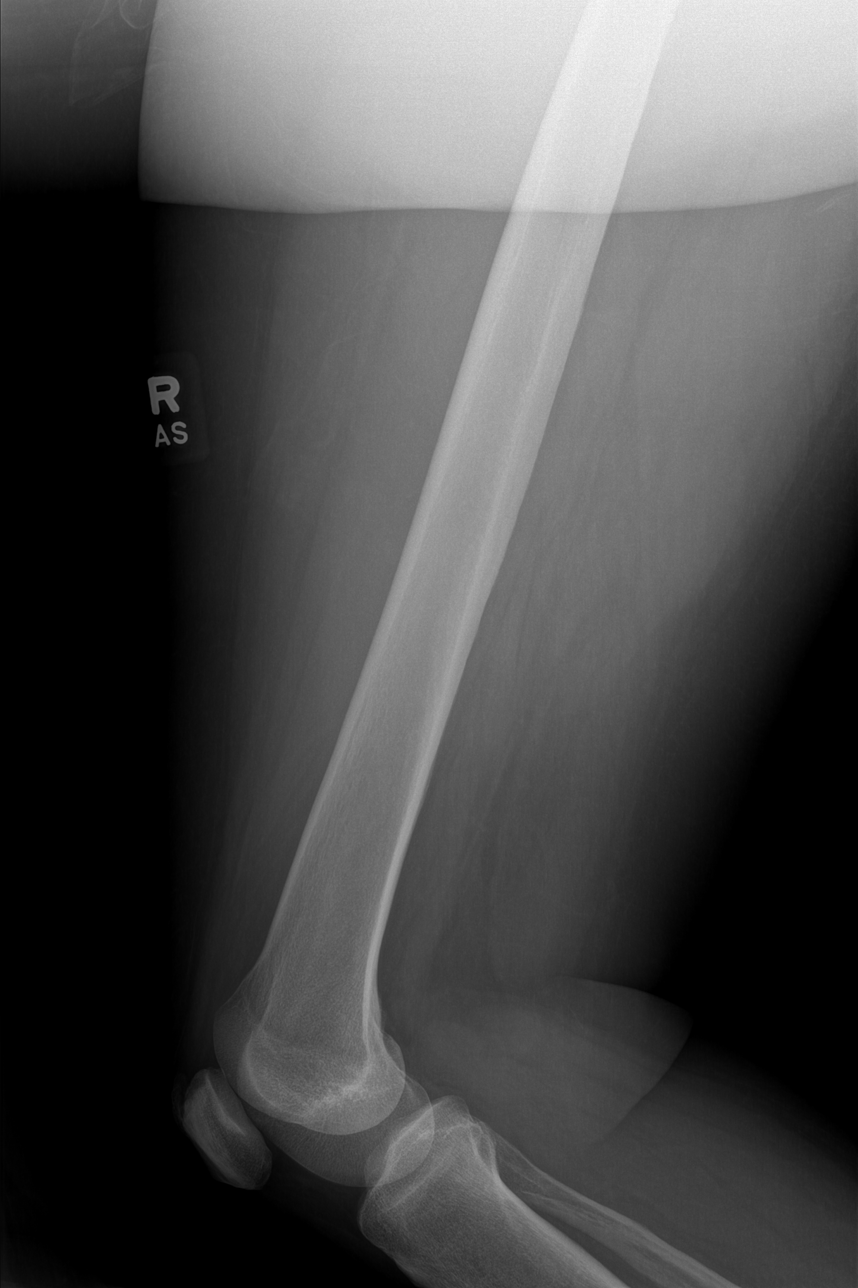

[4 of 4 positions shown; findings below may reference images not displayed]

FINDINGS: There is no evidence of fracture or other focal bone lesions. Soft
tissues are unremarkable.
IMPRESSION: Negative.

## 2021-03-02 ENCOUNTER — Ambulatory Visit: Payer: BC Managed Care – PPO | Admitting: Nurse Practitioner

## 2021-03-02 ENCOUNTER — Other Ambulatory Visit: Payer: Self-pay

## 2021-03-02 ENCOUNTER — Encounter: Payer: Self-pay | Admitting: Nurse Practitioner

## 2021-03-02 VITALS — BP 122/80 | HR 84 | Temp 98.5°F | Ht 66.0 in | Wt 208.2 lb

## 2021-03-02 DIAGNOSIS — Z7689 Persons encountering health services in other specified circumstances: Secondary | ICD-10-CM

## 2021-03-02 DIAGNOSIS — E6609 Other obesity due to excess calories: Secondary | ICD-10-CM

## 2021-03-02 DIAGNOSIS — G8929 Other chronic pain: Secondary | ICD-10-CM

## 2021-03-02 DIAGNOSIS — M25531 Pain in right wrist: Secondary | ICD-10-CM | POA: Diagnosis not present

## 2021-03-02 DIAGNOSIS — Z6833 Body mass index (BMI) 33.0-33.9, adult: Secondary | ICD-10-CM

## 2021-03-02 DIAGNOSIS — M5442 Lumbago with sciatica, left side: Secondary | ICD-10-CM | POA: Diagnosis not present

## 2021-03-02 DIAGNOSIS — M5441 Lumbago with sciatica, right side: Secondary | ICD-10-CM

## 2021-03-02 MED ORDER — TRIAMCINOLONE ACETONIDE 40 MG/ML IJ SUSP: 40.0000 mg | Freq: Once | INTRAMUSCULAR | Status: DC

## 2021-03-02 NOTE — Progress Notes (Signed)
This visit occurred during the SARS-CoV-2 public health emergency.  Safety protocols were in place, including screening questions prior to the visit, additional usage of staff PPE, and extensive cleaning of exam room while observing appropriate contact time as indicated for disinfecting solutions.  Subjective:     Patient ID: Veronica Lynch , female    DOB: 10/19/1965 , 55 y.o.   MRN: [id number]   Chief Complaint  Patient presents with   Establish Care   Back Pain   Wrist Pain   HPI  Patient presents today to establish primary care. She Dr. Parke Simmers. She does administration work. She has been having problems with her lower back which has been chronic for her. She does do water aerobics. She is diabetic. She also has some wrist pain on her right arm due to administrative work.   Back Pain This is a recurrent problem. The current episode started more than 1 year ago. The pain is present in the lumbar spine. The quality of the pain is described as aching. The pain does not radiate. The pain is at a severity of 8/10. The pain is moderate. The pain is Worse during the day. The symptoms are aggravated by sitting. Stiffness is present In the morning. Pertinent negatives include no chest pain, headaches or weakness. She has tried home exercises for the symptoms. The treatment provided mild relief.  Wrist Pain  The pain is present in the right wrist and right hand. This is a recurrent problem. The current episode started 1 to 4 weeks ago. The problem occurs constantly. The problem has been gradually worsening. The pain is moderate. Associated symptoms include an inability to bear weight. Exacerbated by: closing her hand up bothers her. She has tried nothing for the symptoms.    Past Medical History:  Diagnosis Date   CVA (cerebral vascular accident) ([facility]) 2018   Hyperlipidemia    Hypertension    Shingles      Family History  Problem Relation Age of Onset   Hypertension Mother    Hyperlipidemia  Mother    Osteoporosis Mother    Cancer Father    Sarcoidosis Sister    Diabetes Maternal Aunt      Current Outpatient Medications:    Ascorbic Acid (VITAMIN C) 1000 MG tablet, Take 1,000 mg by mouth daily., Disp: , Rfl:    aspirin EC 81 MG tablet, Take 1 tablet (81 mg total) by mouth daily. Swallow whole., Disp: 90 tablet, Rfl: 3   Biotin 10 MG TABS, Take by mouth daily at 6 (six) AM., Disp: , Rfl:    celecoxib (CELEBREX) 200 MG capsule, Take 200 mg by mouth daily., Disp: , Rfl:    Cholecalciferol (VITAMIN D3) 125 MCG (5000 UT) CAPS, Take by mouth daily at 6 (six) AM., Disp: , Rfl:    metoprolol succinate (TOPROL-XL) 25 MG 24 hr tablet, Take 1 tablet (25 mg total) by mouth daily. Take with or immediately following a meal., Disp: 90 tablet, Rfl: 3   nitroGLYCERIN (NITROSTAT) 0.4 MG SL tablet, Place 1 tablet (0.4 mg total) under the tongue every 5 (five) minutes as needed for chest pain., Disp: 90 tablet, Rfl: 3   Alirocumab (PRALUENT) 75 MG/ML SOAJ, Inject 75 mg into the skin 2 (two) times a week. (Patient not taking: Reported on 03/02/2021), Disp: 6 mL, Rfl: 3   telmisartan-hydrochlorothiazide (MICARDIS HCT) 40-12.5 MG tablet, Take 1 tablet by mouth every morning. (Patient not taking: No sig reported), Disp: , Rfl:    Allergies  Allergen Reactions   Bactroban [Mupirocin]    Codeine      Review of Systems  Constitutional:  Negative for chills and fatigue.  HENT:  Negative for congestion.   Respiratory:  Negative for cough, shortness of breath and wheezing.   Cardiovascular:  Negative for chest pain and palpitations.  Musculoskeletal:  Positive for back pain.  Neurological:  Negative for weakness and headaches.    Today's Vitals   03/02/21 1417  BP: 122/80  Pulse: 84  Temp: 98.5 F (36.9 C)  Weight: 208 lb 3.2 oz (94.4 kg)  Height: 5\' 6"  (1.676 m)  PainSc: 0-No pain   Body mass index is 33.6 kg/m.   Objective:  Physical Exam Constitutional:      Appearance: Normal  appearance.  HENT:     Head: Normocephalic and atraumatic.  Cardiovascular:     Rate and Rhythm: Normal rate and regular rhythm.     Pulses: Normal pulses.     Heart sounds: Normal heart sounds. No murmur heard.   No friction rub.  Pulmonary:     Effort: Pulmonary effort is normal. No respiratory distress.     Breath sounds: Normal breath sounds. No wheezing.  Musculoskeletal:        General: Normal range of motion.     Right upper arm: Tenderness present.     Lumbar back: Tenderness present.  Skin:    General: Skin is warm and dry.     Capillary Refill: Capillary refill takes less than 2 seconds.  Neurological:     Mental Status: She is alert and oriented to person, place, and time.        Assessment And Plan:     1. Establishing care with new doctor, encounter for -Patient is here to establish care. over patient medical, family, social and surgical history. -Reviewed with patient their medications and any allergies  -Reviewed with patient their sexual orientation, drug/tobacco and alcohol use -Dicussed any new concerns with patient  -recommended patient comes in for a physical exam and complete blood work.  -Educated patient about the importance of annual screenings and immunizations.  -Advised patient to eat a healthy diet along with exercise for atleast 30-45 min atleast 4-5 days of the week.   2. Chronic bilateral low back pain with bilateral sciatica -Chronic back pain. -Reviewed prior images with the patient  -Recommended physical therapy, chiropractor and proper body mechanics.  -Recommended in office kenalog to help with pain and inflammation. Patient declined.  -Will refer to orthopedic for proper treatment management and options.  - AMB referral to orthopedics  3. Right wrist pain -Recommended patient wears wrist splint to help with the pain.  -Continue taking Celebrex -Pt states it is not Carpel tunnel as she has been dealing with this for years.  -  AMB referral to orthopedics  4. Class 1 obesity due to excess calories with body mass index (BMI) of 33.0 to 33.9 in adult, unspecified whether serious comorbidity present  -Advised patient on a healthy diet including avoiding fast food and red meats. Increase the intake of lean meats including grilled chicken and Micah Flesher.  Drink a lot of water. Decrease intake of fatty foods. Exercise for 30-45 min. 4-5 a week to decrease the risk of cardiac event.   The patient was encouraged to call or send a message through MyChart for any questions or concerns.   Follow up: if symptoms persist or do not get better.   Side effects and appropriate use of  all the medication(s) were discussed with the patient today. Patient advised to use the medication(s) as directed by their healthcare provider. The patient was encouraged to read, review, and understand all associated package inserts and contact our office with any questions or concerns. The patient accepts the risks of the treatment plan and had an opportunity to ask questions.   Patient was given opportunity to ask questions. Patient verbalized understanding of the plan and was able to repeat key elements of the plan. All questions were answered to their satisfaction.  Raman Aalyah Mansouri, DNP   I, Raman Mirl Hillery have reviewed all documentation for this visit. The documentation on 03/02/21 for the exam, diagnosis, procedures, and orders are all accurate and complete.    IF YOU HAVE BEEN REFERRED TO A SPECIALIST, IT MAY TAKE 1-2 WEEKS TO SCHEDULE/PROCESS THE REFERRAL. IF YOU HAVE NOT HEARD FROM US/SPECIALIST IN TWO WEEKS, PLEASE GIVE [place] A CALL AT [phone] X [phone].   THE PATIENT IS ENCOURAGED TO PRACTICE SOCIAL DISTANCING DUE TO THE COVID-19 PANDEMIC.

## 2021-03-02 NOTE — Patient Instructions (Signed)
Exercising to Lose Weight Exercise is structured, repetitive physical activity to improve fitness and health. Getting regular exercise is important for everyone. It is especially important if you are overweight. Being overweight increases your risk of heart disease, stroke, diabetes, high blood pressure, and several types of cancer.Reducing your calorie intake and exercising can help you lose weight. Exercise is usually categorized as moderate or vigorous intensity. To lose weight, most people need to do a certain amount of moderate-intensity orvigorous-intensity exercise each week. Moderate-intensity exercise  Moderate-intensity exercise is any activity that gets you moving enough to burn at least three times more energy (calories) than if you were sitting. Examples of moderate exercise include: Walking a mile in 15 minutes. Doing light yard work. Biking at an easy pace. Most people should get at least 150 minutes (2 hours and 30 minutes) a week ofmoderate-intensity exercise to maintain their body weight. Vigorous-intensity exercise Vigorous-intensity exercise is any activity that gets you moving enough to burn at least six times more calories than if you were sitting. When you exercise at this intensity, you should be working hard enough that you are not able tocarry on a conversation. Examples of vigorous exercise include: Running. Playing a team sport, such as football, basketball, and soccer. Jumping rope. Most people should get at least 75 minutes (1 hour and 15 minutes) a week ofvigorous-intensity exercise to maintain their body weight. How can exercise affect me? When you exercise enough to burn more calories than you eat, you lose weight. Exercise also reduces body fat and builds muscle. The more muscle you have, the more calories you burn. Exercise also: Improves mood. Reduces stress and tension. Improves your overall fitness, flexibility, and endurance. Increases bone strength. The  amount of exercise you need to lose weight depends on: Your age. The type of exercise. Any health conditions you have. Your overall physical ability. Talk to your health care provider about how much exercise you need and whattypes of activities are safe for you. What actions can I take to lose weight? Nutrition  Make changes to your diet as told by your health care provider or diet and nutrition specialist (dietitian). This may include: Eating fewer calories. Eating more protein. Eating less unhealthy fats. Eating a diet that includes fresh fruits and vegetables, whole grains, low-fat dairy products, and lean protein. Avoiding foods with added fat, salt, and sugar. Drink plenty of water while you exercise to prevent dehydration or heat stroke.  Activity Choose an activity that you enjoy and set realistic goals. Your health care provider can help you make an exercise plan that works for you. Exercise at a moderate or vigorous intensity most days of the week. The intensity of exercise may vary from person to person. You can tell how intense a workout is for you by paying attention to your breathing and heartbeat. Most people will notice their breathing and heartbeat get faster with more intense exercise. Do resistance training twice each week, such as: Push-ups. Sit-ups. Lifting weights. Using resistance bands. Getting short amounts of exercise can be just as helpful as long structured periods of exercise. If you have trouble finding time to exercise, try to include exercise in your daily routine. Get up, stretch, and walk around every 30 minutes throughout the day. Go for a walk during your lunch break. Park your car farther away from your destination. If you take public transportation, get off one stop early and walk the rest of the way. Make phone calls while standing up and   walking around. Take the stairs instead of elevators or escalators. Wear comfortable clothes and shoes with  good support. Do not exercise so much that you hurt yourself, feel dizzy, or get very short of breath. Where to find more information U.[place] Department of Health and Human Services: www.hhs.gov Centers for Disease Control and Prevention (CDC): www.cdc.gov Contact a health care provider: Before starting a new exercise program. If you have questions or concerns about your weight. If you have a medical problem that keeps you from exercising. Get help right away if you have any of the following while exercising: Injury. Dizziness. Difficulty breathing or shortness of breath that does not go away when you stop exercising. Chest pain. Rapid heartbeat. Summary Being overweight increases your risk of heart disease, stroke, diabetes, high blood pressure, and several types of cancer. Losing weight happens when you burn more calories than you eat. Reducing the amount of calories you eat in addition to getting regular moderate or vigorous exercise each week helps you lose weight. This information is not intended to replace advice given to you by your health care provider. Make sure you discuss any questions you have with your healthcare provider. Document Revised: 10/12/2019 Document Reviewed: 10/29/2019 Elsevier Patient Education  2022 Elsevier Inc.  

## 2021-03-08 ENCOUNTER — Encounter: Payer: Self-pay | Admitting: Physician Assistant

## 2021-03-08 ENCOUNTER — Other Ambulatory Visit: Payer: Self-pay

## 2021-03-08 ENCOUNTER — Ambulatory Visit: Payer: Self-pay

## 2021-03-08 ENCOUNTER — Ambulatory Visit: Payer: BC Managed Care – PPO | Admitting: Physician Assistant

## 2021-03-08 DIAGNOSIS — M545 Low back pain, unspecified: Secondary | ICD-10-CM

## 2021-03-08 DIAGNOSIS — M7061 Trochanteric bursitis, right hip: Secondary | ICD-10-CM

## 2021-03-08 DIAGNOSIS — M79641 Pain in right hand: Secondary | ICD-10-CM | POA: Diagnosis not present

## 2021-03-08 DIAGNOSIS — M654 Radial styloid tenosynovitis [de Quervain]: Secondary | ICD-10-CM

## 2021-03-08 DIAGNOSIS — G8929 Other chronic pain: Secondary | ICD-10-CM

## 2021-03-08 NOTE — Progress Notes (Addendum)
Office Visit Note   Patient: Veronica Lynch           Date of Birth: Nov 02, 1965           MRN: [id number] Visit Date: 03/08/2021              Requested by: Charlesetta Ivory, NP [address] [place],  [place] [address] PCP: Dorothyann Peng, MD   Assessment & Plan: Visit Diagnoses:  1. Pain in right hand   2. Tendinitis, de Quervain's   3. Trochanteric bursitis, right hip   4. Chronic bilateral low back pain without sciatica     Plan: We will send her for EMG nerve conduction studies to rule out carpal tunnel syndrome involving the right hand.  Also we will send her to formal physical therapy prescription is given for her back and also for trochanteric bursitis bilaterally.  They will work on modalities, stretching, core exercises andhome exercise program.  We will see her back in approximately 4 weeks to go over the EMG nerve conduction studies.  Also see how she is done regards to trochanteric bursitis and responded to the injection on the right side.  She shown IT band stretching exercises today.  I will reexamine her right first extensor compartment for de Quervain's tenosynovitis.  She will continue her Celebrex.  She is to monitor her glucose levels closely over the next 24 to 48 hours and she understands that cortisone can raise her glucose levels.  Radiographs lumbar spine dated 02/06/2021 were reviewed and showed no acute fracture.  Overall alignment is normal.  Normal lordotic curvature.  Some minimal endplate osteophytes at L3-4 and L4-5 otherwise negative for any acute findings. Radiographs bilateral femurs are reviewed from 02/06/2021.  These showed no acute fractures.  No bony abnormalities both hips well located.  Follow-Up Instructions: Return in about 4 weeks (around 04/05/2021).   Orders:  Orders Placed This Encounter  Procedures   Large Joint Inj   Hand/UE Inj   XR Hand Complete Right   Ambulatory referral to Physical Medicine Rehab   Ambulatory referral  to Physical Therapy   No orders of the defined types were placed in this encounter.     Procedures: Large Joint Inj: R greater trochanter on 04/10/2021 4:20 PM Indications: pain Details: 22 G 1.5 in needle, lateral approach  Arthrogram: No  Medications: 3 mL lidocaine 1 %; 40 mg methylPREDNISolone acetate 40 MG/ML Outcome: tolerated well, no immediate complications Procedure, treatment alternatives, risks and benefits explained, specific risks discussed. Consent was given by the patient. Immediately prior to procedure a time out was called to verify the correct patient, procedure, equipment, support staff and site/side marked as required. Patient was prepped and draped in the usual sterile fashion.    Hand/UE Inj for de Quervain's tenosynovitis on 04/10/2021 4:21 PM Details: medial approach Medications: 1 mL lidocaine 1 %; 40 mg methylPREDNISolone acetate 40 MG/ML Consent was given by the patient. Immediately prior to procedure a time out was called to verify the correct patient, procedure, equipment, support staff and site/side marked as required. Patient was prepped and draped in the usual sterile fashion.      Clinical Data: No additional findings.   Subjective: Chief Complaint  Patient presents with   Right Wrist - Pain   Lower Back - Pain    HPI [name] is a pleasant 55 year old female were seen for multiple complaints today.  She is complaining of right wrist pain, low back pain and  bilateral hip pain.  She states she has had right wrist pain for the past month.  She does report removal of a ganglion cyst in her right hand 2010 and she has a scar over the distal ulna region.  She has had no known injury to her right hand.  She does work for [employer] and does a lot of computer work.  She notes she has pain in the hand and wrist.  She has numbness in the hand including the fourth and fifth fingers at times.  She notes decreased grip strength of the hand.  She points over  the first extensor compartment of the right wrist that is painful also.  7 low back pain that radiates into her hips this is been ongoing for years.  Is getting worse.  She has no radicular symptoms down either leg.  She has had no acute injuries.  She does have some occasional fourth and fifth toe tingling bilaterally but this is rare.  She is taking Celebrex which helps some.  Pain is worse when she first stands in the morning and after sitting for prolonged period of time.  She is diabetic she reports her hemoglobin A1c is approximately 7 at her last check.  Review of Systems See HPI otherwise negative or noncontributory.  Denies any fevers or chills.  Objective: Vital Signs: There were no vitals taken for this visit.  Physical Exam General: Well-developed well-nourished female no acute distress mood and affect appropriate. Psych alert and oriented x3. Vascular radial pulses are 2+ bilaterally equal symmetric.  Dorsal pedal pulses are 2+ and equal and symmetric.  Calf supple nontender bilaterally. Ortho Exam Bilateral hands she has negative Phalen's bilaterally.  Tinel's and compression test over the left wrist causes no symptoms.  Tinel's and compression over the right wrist at median nerve cause severe pain.  Pain radiates down into her fingers particularly over the median nerve distribution.  Negative grind test bilateral thumbs.  She has positive Finkelstein's test on the right.  Tenderness over the right first extensor compartment.  Tinel's over the ulnar nerve at the elbow is negative bilaterally. Lumbar spine she has full forward flexion slightly limited extension.  Tenderness right lower lumbar paraspinous region.  5 out of 5 strength throughout lower extremities against resistance.  Negative straight leg raise bilaterally.  She has good range of motion bilateral hips without pain.  Tenderness over the trochanteric region bilaterally right greater than left. Specialty Comments:  No  specialty comments available.  Imaging: No results found.   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   Coronary artery disease of native artery of native heart with stable angina pectoris (HCC) 12/17/2020   Statin intolerance 12/17/2020   Essential hypertension 11/17/2020   Mixed hyperlipidemia 11/17/2020   Palpitations 11/17/2020   Exertional chest pain 11/17/2020   Exertional dyspnea 11/17/2020   Past Medical History:  Diagnosis Date   CVA (cerebral vascular accident) (HCC) 2018   CVA (cerebral vascular accident) (HCC) 2017   Diabetes (HCC)    Hyperlipidemia    Hypertension    Shingles     Family History  Problem Relation Age of Onset   Hypertension Mother    Hyperlipidemia Mother    Osteoporosis Mother    Cancer Father    Sarcoidosis Sister    Diabetes Maternal Aunt     Past Surgical History:  Procedure Laterality Date   CHOLECYSTECTOMY     CHOLECYSTECTOMY  1990   CYST REMOVAL HAND Right    wrist   CYST REMOVAL LEG     left knee   CYST REMOVAL LEG Left    knee   HYSTEROTOMY     LAPAROSCOPIC GASTRIC BANDING     LAPAROSCOPIC GASTRIC BANDING  2010   ROTATOR CUFF REPAIR Left    SHOULDER OPEN ROTATOR CUFF REPAIR     TOTAL ABDOMINAL HYSTERECTOMY     Social History   Occupational History   Not on file  Tobacco Use   Smoking status: Never   Smokeless tobacco: Never  Vaping Use   Vaping Use: Never used  Substance and Sexual Activity   Alcohol use: Not Currently    Comment: occ wine   Drug use: Never   Sexual activity: Not on file

## 2021-03-09 NOTE — Addendum Note (Signed)
Addended by: Barbette Or on: 03/09/2021 04:24 PM   Modules accepted: Orders

## 2021-03-23 ENCOUNTER — Emergency Department
Admission: EM | Admit: 2021-03-23 | Discharge: 2021-03-23 | Disposition: A | Discharge status: Home / Self Care | Payer: BC Managed Care – PPO | Attending: Emergency Medicine | Admitting: Emergency Medicine

## 2021-03-23 ENCOUNTER — Other Ambulatory Visit: Payer: Self-pay

## 2021-03-23 ENCOUNTER — Encounter: Payer: Self-pay | Admitting: Physician Assistant

## 2021-03-23 ENCOUNTER — Emergency Department: Payer: BC Managed Care – PPO

## 2021-03-23 DIAGNOSIS — E119 Type 2 diabetes mellitus without complications: Secondary | ICD-10-CM | POA: Insufficient documentation

## 2021-03-23 DIAGNOSIS — R519 Headache, unspecified: Secondary | ICD-10-CM

## 2021-03-23 DIAGNOSIS — I10 Essential (primary) hypertension: Secondary | ICD-10-CM | POA: Insufficient documentation

## 2021-03-23 HISTORY — DX: Type 2 diabetes mellitus without complications: E11.9

## 2021-03-23 LAB — CBC WITH DIFFERENTIAL/PLATELET
Abs Immature Granulocytes: 0.02 10*3/uL (ref 0.00–0.07)
Basophils Absolute: 0 10*3/uL (ref 0.0–0.1)
Basophils Relative: 0 %
Eosinophils Absolute: 0.1 10*3/uL (ref 0.0–0.5)
Eosinophils Relative: 2 %
HCT: 36.1 % (ref 36.0–46.0)
Hemoglobin: 12.5 g/dL (ref 12.0–15.0)
Immature Granulocytes: 0 %
Lymphocytes Relative: 31 %
Lymphs Abs: 1.5 10*3/uL (ref 0.7–4.0)
MCH: 29.1 pg (ref 26.0–34.0)
MCHC: 34.6 g/dL (ref 30.0–36.0)
MCV: 84 fL (ref 80.0–100.0)
Monocytes Absolute: 0.5 10*3/uL (ref 0.1–1.0)
Monocytes Relative: 10 %
Neutro Abs: 2.6 10*3/uL (ref 1.7–7.7)
Neutrophils Relative %: 57 %
Platelets: 263 10*3/uL (ref 150–400)
RBC: 4.3 MIL/uL (ref 3.87–5.11)
RDW: 13.1 % (ref 11.5–15.5)
WBC: 4.7 10*3/uL (ref 4.0–10.5)
nRBC: 0 % (ref 0.0–0.2)

## 2021-03-23 LAB — BASIC METABOLIC PANEL
Anion gap: 9 (ref 5–15)
BUN: 12 mg/dL (ref 6–20)
CO2: 29 mmol/L (ref 22–32)
Calcium: 9.2 mg/dL (ref 8.9–10.3)
Chloride: 100 mmol/L (ref 98–111)
Creatinine, Ser: 0.89 mg/dL (ref 0.44–1.00)
GFR, Estimated: >60 mL/min (ref >60)
Glucose, Bld: 184 mg/dL — ABNORMAL HIGH (ref 70–99)
Potassium: 4 mmol/L (ref 3.5–5.1)
Sodium: 138 mmol/L (ref 135–145)

## 2021-03-23 LAB — LIPID PANEL
Chol/HDL Ratio: 5.2 ratio — ABNORMAL HIGH (ref 0.0–4.4)
Cholesterol, Total: 268 mg/dL — ABNORMAL HIGH (ref 100–199)
HDL: 52 mg/dL (ref >39)
LDL Chol Calc (NIH): 191 mg/dL — ABNORMAL HIGH (ref 0–99)
Triglycerides: 135 mg/dL (ref 0–149)
VLDL Cholesterol Cal: 25 mg/dL (ref 5–40)

## 2021-03-23 MED ORDER — MAGNESIUM SULFATE 2 GM/50ML IV SOLN
2.0000 g | Freq: Once | INTRAVENOUS | Status: AC
  Administered 2021-03-23: 2 g via INTRAVENOUS
  Filled 2021-03-23: qty 50

## 2021-03-23 MED ORDER — PROCHLORPERAZINE EDISYLATE 10 MG/2ML IJ SOLN
10.0000 mg | Freq: Once | INTRAMUSCULAR | Status: AC
  Administered 2021-03-23: 10 mg via INTRAVENOUS
  Filled 2021-03-23: qty 2

## 2021-03-23 MED ORDER — LACTATED RINGERS IV BOLUS
1000.0000 mL | Freq: Once | INTRAVENOUS | Status: AC
  Administered 2021-03-23: 1000 mL via INTRAVENOUS

## 2021-03-23 NOTE — ED Notes (Signed)
See triage note. Pt woke up this morning and noticed at 0400 facial drooping and weakness on whole L side body. Symptoms resolved but pt also c/o severe HA and has hx migraines. Pt had stroke in 2017 and takes Eliquis but has been off for 4d.   Pt has somewhat slowed responses but is alert, oriented. Pt feels weak. Denies NVD, fevers, SOB, CP. Pt does have dx of angina that occurs at rest or with exertion and has PRN NTG at home. Also extensive medical hx with multiple surgeries.  C/O L side HA and dull pain in L neck "in my carotid artery".  EDP has evaluated pt at bedside.

## 2021-03-23 NOTE — ED Notes (Signed)
Pt resting in bed with eyes closed.

## 2021-03-23 NOTE — Discharge Instructions (Addendum)
Your blood pressure was elevated today.  Please follow-up with your primary care doctor to have this rechecked.

## 2021-03-23 NOTE — ED Provider Notes (Signed)
[facility] Emergency Department Provider Note  ____________________________________________   Event Date/Time   First MD Initiated Contact with Patient 03/23/21 [phone]     (approximate)  I have reviewed the triage vital signs and the nursing notes.   HISTORY  Chief Complaint No chief complaint on file.   HPI Veronica Lynch is a 55 y.o. female with past history of DM, CVA several years ago with some residual left hand body weakness and migraine headaches who presents for assessment of headache states she woke up with this morning and it did not improve her usual Excedrin coffee.  She states she went to bed feeling fine last night.  Dates that other than headache she felt weak this morning.  She states she was not sure if her ex was drooping or she had any new weakness from baseline.  She denies any vision changes, earache, sore throat, fevers, chest pain, cough, shortness of breath, nausea, vomiting, diarrhea, dysuria, rash, recent injuries or falls or other acute concerns.  States she had previously been on Eliquis after her stroke but has not taken this in nearly a week as she is transitioning doctors and has not seen her new doctor yet for it.  Denies any illicit drug use or EtOH use.  No other acute concerns at this time.         Past Medical History:  Diagnosis Date   CVA (cerebral vascular accident) (HCC) 2017   Diabetes (HCC)     There are no problems to display for this patient.   Past Surgical History:  Procedure Laterality Date   CHOLECYSTECTOMY  1990   CYST REMOVAL HAND Right    wrist   CYST REMOVAL LEG Left    knee   HYSTEROTOMY     LAPAROSCOPIC GASTRIC BANDING  2010   ROTATOR CUFF REPAIR Left     Prior to Admission medications   Not on File    Allergies Bactrim [sulfamethoxazole-trimethoprim] and Codeine  History reviewed. No pertinent family history.  Social History Social History   Tobacco Use   Smoking status: Never    Smokeless tobacco: Never  Vaping Use   Vaping Use: Never used  Substance Use Topics   Alcohol use: Not Currently   Drug use: Never    Review of Systems  Review of Systems  Constitutional:  Positive for malaise/fatigue. Negative for chills and fever.  HENT:  Negative for sore throat.   Eyes:  Negative for pain.  Respiratory:  Negative for cough and stridor.   Cardiovascular:  Negative for chest pain.  Gastrointestinal:  Negative for vomiting.  Genitourinary:  Negative for dysuria.  Musculoskeletal:  Negative for myalgias.  Skin:  Negative for rash.  Neurological:  Positive for focal weakness (chronic in L arm and L leg), weakness and headaches. Negative for seizures and loss of consciousness.  Psychiatric/Behavioral:  Negative for suicidal ideas.   All other systems reviewed and are negative.    ____________________________________________   PHYSICAL EXAM:  VITAL SIGNS: ED Triage Vitals [03/23/21 0700]  Enc Vitals Group     BP (!) 159/72     Pulse Rate 89     Resp 18     Temp 98.8 F (37.1 C)     Temp Source Oral     SpO2 98 %     Weight      Height      Head Circumference      Peak Flow      Pain  Score      Pain Loc      Pain Edu?      Excl. in GC?    Vitals:   03/23/21 0700  BP: (!) 159/72  Pulse: 89  Resp: 18  Temp: 98.8 F (37.1 C)  SpO2: 98%   Physical Exam Vitals and nursing note reviewed.  Constitutional:      General: She is not in acute distress.    Appearance: She is well-developed.  HENT:     Head: Normocephalic and atraumatic.     Right Ear: External ear normal.     Left Ear: External ear normal.     Nose: Nose normal.     Mouth/Throat:     Mouth: Mucous membranes are moist.  Eyes:     Conjunctiva/sclera: Conjunctivae normal.  Cardiovascular:     Rate and Rhythm: Normal rate and regular rhythm.     Heart sounds: No murmur heard. Pulmonary:     Effort: Pulmonary effort is normal. No respiratory distress.     Breath sounds: Normal  breath sounds.  Abdominal:     Palpations: Abdomen is soft.     Tenderness: There is no abdominal tenderness.  Musculoskeletal:     Cervical back: Neck supple.  Skin:    General: Skin is warm and dry.     Capillary Refill: Capillary refill takes less than 2 seconds.  Neurological:     Mental Status: She is alert and oriented to person, place, and time.  Psychiatric:        Mood and Affect: Mood normal.    Cranial nerves II through XII grossly intact.  No facial drift or slurred speech.  Patient is weaker in her left upper extremity and left lower extremity compared to the right hemibody.  However she is able to grip this examiner on the left and lift her heel off the bed on the left L5 with decreased in compared to the right.  Sensation is intact to light touch throughout all extremities.  She is oriented x4. ____________________________________________   LABS (all labs ordered are listed, but only abnormal results are displayed)  Labs Reviewed  BASIC METABOLIC PANEL - Abnormal; Notable for the following components:      Result Value   Glucose, Bld 184 (*)    All other components within normal limits  CBC WITH DIFFERENTIAL/PLATELET   ____________________________________________  EKG  Sinus rhythm with a ventricle rate of 76, normal axis, unremarkable intervals without clear evidence of acute ischemia or significant arrhythmia. ____________________________________________  RADIOLOGY  ED MD interpretation: CT head is unremarkable for evidence of SAH, subacute CVA, mass-effect, ventriculomegaly or of acute intracranial process  Official radiology report(s): CT HEAD WO CONTRAST ( )  Result Date: 03/23/2021 CLINICAL DATA:  55 year old female with altered mental status. "Stroke in 2017". EXAM: CT HEAD WITHOUT CONTRAST TECHNIQUE: Contiguous axial images were obtained from the base of the skull through the vertex without intravenous contrast. COMPARISON:  None. FINDINGS: Brain:  Cerebral volume is within normal limits for age. No midline shift, ventriculomegaly, mass effect, evidence of mass lesion, intracranial hemorrhage or evidence of cortically based acute infarction. Gray-white matter differentiation is within normal limits throughout the brain. No encephalomalacia identified. Vascular: Mild Calcified atherosclerosis at the skull base. No suspicious intracranial vascular hyperdensity. Skull: Negative. Sinuses/Orbits: Visualized paranasal sinuses and mastoids are clear. Other: Visualized orbits and scalp soft tissues are within normal limits. IMPRESSION: Negative noncontrast Head CT. Electronically Signed   By: Althea Grimmer.D.  On: 03/23/2021 08:34    ____________________________________________   PROCEDURES  Procedure(s) performed (including Critical Care):  Procedures   ____________________________________________   INITIAL IMPRESSION / ASSESSMENT AND PLAN / ED COURSE      Patient presents with above to history exam for assessment of headache she describes as little different and worse than usual migraine headaches that she woke with this morning around 4 AM.  He reportedly did not respond to her usual Excedrin and COVID.  On arrival she is hypertensive with BP of 159/70 with otherwise stable vital signs on room air.  On exam she has some weakness in her left arm and left leg which she states is chronic without any appreciable acute neurodeficits.  Differential includes migraine headache, SAH, metabolic derangements, dehydration.  No apparent new focal deficits to suggest acute CVA.  No history exam features to suggest recent trauma or deep space infection in the head or neck.  CT head is unremarkable for evidence of SAH, subacute CVA, mass-effect, ventriculomegaly or of acute intracranial process.  CBC without leukocytosis or acute anemia.  BMP shows a glucose of 184 without any other significant electrolyte or metabolic derangements.  Patient treated with  below noted headache cocktail.  On reassessment she states she is feeling much better.  He does not appear to be clinically significantly dehydrated.  Given stable vitals advised patient exam work-up and improvement in headache with some fluids and bilateral medications I think she is stable for discharge with close outpatient follow-up.  Suspect migraine versus other primary headache at this time.       ____________________________________________   FINAL CLINICAL IMPRESSION(S) / ED DIAGNOSES  Final diagnoses:  Acute nonintractable headache, unspecified headache type  Hypertension, unspecified type    Medications  lactated ringers bolus 1,000 mL (1,000 mLs Intravenous New Bag/Given 03/23/21 0742)  magnesium sulfate IVPB 2 g 50 mL (2 g Intravenous New Bag/Given 03/23/21 0746)  prochlorperazine (COMPAZINE) injection 10 mg (10 mg Intravenous Given 03/23/21 0932)     ED Discharge Orders     None        Note:  This document was prepared using Dragon voice recognition software and may include unintentional dictation errors.    Gilles Chiquito, MD 03/23/21 [phone]

## 2021-03-23 NOTE — ED Notes (Signed)
Pt to CT

## 2021-03-23 NOTE — ED Triage Notes (Addendum)
Pt arrived to ed via pov for concerns of possible stroke. Pt reports stroke in 2017, reports some left sided deficits. Pt a& o x 4, appears weak and lethargic at times. Pt c/o going to bed last night feeling fatigued, woke up this morning with severe headache, generalized weakness, states she felt there was more left sided facial droop, and left sided weakness. Pt needs assistance to stand and weakness noted on the left side. Unable to determine if weakness if from previous stoke MD at bedside at this time than previously. Takes eliquis due to previous stroke

## 2021-03-27 ENCOUNTER — Telehealth: Payer: Self-pay

## 2021-03-27 ENCOUNTER — Ambulatory Visit: Payer: BC Managed Care – PPO | Admitting: Cardiology

## 2021-03-27 NOTE — Telephone Encounter (Signed)
Pt called after being seen in ED. She stated she felt fine, everything went smooth, she did not feel the need to schedule an apt. Pt notified if anything changes before her apt in November, any questions or concerns feel free to give the office an call.

## 2021-03-28 ENCOUNTER — Other Ambulatory Visit: Payer: Self-pay

## 2021-03-28 ENCOUNTER — Ambulatory Visit (INDEPENDENT_AMBULATORY_CARE_PROVIDER_SITE_OTHER): Payer: BC Managed Care – PPO | Admitting: Physical Medicine and Rehabilitation

## 2021-03-28 ENCOUNTER — Encounter: Payer: Self-pay | Admitting: Physical Medicine and Rehabilitation

## 2021-03-28 DIAGNOSIS — R202 Paresthesia of skin: Secondary | ICD-10-CM | POA: Diagnosis not present

## 2021-03-28 NOTE — Progress Notes (Signed)
[name] - 55 y.o. female MRN [id number]  Date of birth: 1966/06/04  Office Visit Note: Visit Date: 03/28/2021 PCP: Dorothyann Peng, MD Referred by: Renaye Rakers, MD  Subjective: Chief Complaint  Patient presents with   Right Hand - Numbness, Pain, Weakness   HPI:  Veronica Lynch is a 55 y.o. female who comes in today at the request of Rexene Edison, PA-C for electrodiagnostic study of the Right upper extremities.  Patient is Right hand dominant.  She reports pain and weakness in the right hand shaking.  She reports sometimes numbness in the first but also fourth and fifth digits in a somewhat nondermatomal fashion.  No left-sided complaints.  No frank radicular symptoms.  She has a history of diabetes but no history of peripheral polyneuropathy. She has had a history of statin induced myositis.  ROS Otherwise per HPI.  Assessment & Plan: Visit Diagnoses:    ICD-10-CM   1. Paresthesia of skin  R20.2 NCV with EMG (electromyography)      Plan: Impression: Essentially NORMAL electrodiagnostic study of the right upper limb.  There is no significant electrodiagnostic evidence of nerve entrapment, brachial plexopathy or cervical radiculopathy.    As you know, purely sensory or demyelinating radiculopathies and chemical radiculitis may not be detected with this particular electrodiagnostic study.  Recommendations: 1.  Follow-up with referring physician. 2.  Continue current management of symptoms.  Meds & Orders: No orders of the defined types were placed in this encounter.   Orders Placed This Encounter  Procedures   NCV with EMG (electromyography)    Follow-up: Return in about 2 weeks (around 04/11/2021) for Rexene Edison, P.A.-C.   Procedures: No procedures performed  EMG & NCV Findings: Evaluation of the right median (across palm) sensory nerve showed no response (Palm).  All remaining nerves (as indicated in the following tables) were within normal limits.    All examined  muscles (as indicated in the following table) showed no evidence of electrical instability.    Impression: Essentially NORMAL electrodiagnostic study of the right upper limb.  There is no significant electrodiagnostic evidence of nerve entrapment, brachial plexopathy or cervical radiculopathy.    As you know, purely sensory or demyelinating radiculopathies and chemical radiculitis may not be detected with this particular electrodiagnostic study.  Recommendations: 1.  Follow-up with referring physician. 2.  Continue current management of symptoms.  ___________________________ Naaman Plummer FAAPMR Board Certified, American Board of Physical Medicine and Rehabilitation    Nerve Conduction Studies Anti Sensory Summary Table   Stim Site NR Peak (ms) Norm Peak (ms) P-T Amp (V) Norm P-T Amp Site1 Site2 Delta-P (ms) Dist (cm) Vel (m/s) Norm Vel (m/s)  Right Median Acr Palm Anti Sensory (2nd Digit)  29.5C  Wrist    3.4 <3.6 26.2 >10 Wrist Palm  0.0    Palm *NR  <2.0          Right Radial Anti Sensory (Base 1st Digit)  29C  Wrist    2.3 <3.1 28.4  Wrist Base 1st Digit 2.3 0.0    Right Ulnar Anti Sensory (5th Digit)  29.3C  Wrist    3.5 <3.7 25.9 >15.0 Wrist 5th Digit 3.5 14.0 40 >38   Motor Summary Table   Stim Site NR Onset (ms) Norm Onset (ms) O-P Amp (mV) Norm O-P Amp Site1 Site2 Delta-0 (ms) Dist (cm) Vel (m/s) Norm Vel (m/s)  Right Median Motor (Abd Poll Brev)  29C  Wrist    3.4 <4.2 7.0 >5  Elbow Wrist 4.2 21.5 51 >50  Elbow    7.6  3.9         Right Ulnar Motor (Abd Dig Min)  29C  Wrist    3.2 <4.2 8.1 >3 B Elbow Wrist 3.5 20.5 59 >53  B Elbow    6.7  7.5  A Elbow B Elbow 1.3 9.0 69 >53  A Elbow    8.0  7.7          EMG   Side Muscle Nerve Root Ins Act Fibs Psw Amp Dur Poly Recrt Int Dennie Bible Comment  Right Abd Poll Brev Median C8-T1 Nml Nml Nml Nml Nml 0 Nml Nml   Right 1stDorInt Ulnar C8-T1 Nml Nml Nml Nml Nml 0 Nml Nml   Right PronatorTeres Median C6-7 Nml Nml Nml Nml Nml 0  Nml Nml   Right Biceps Musculocut C5-6 Nml Nml Nml Nml Nml 0 Nml Nml   Right Deltoid Axillary C5-6 Nml Nml Nml Nml Nml 0 Nml Nml     Nerve Conduction Studies Anti Sensory Left/Right Comparison   Stim Site L Lat (ms) R Lat (ms) L-R Lat (ms) L Amp (V) R Amp (V) L-R Amp (%) Site1 Site2 L Vel (m/s) R Vel (m/s) L-R Vel (m/s)  Median Acr Palm Anti Sensory (2nd Digit)  29.5C  Wrist  3.4   26.2  Wrist Palm     Palm             Radial Anti Sensory (Base 1st Digit)  29C  Wrist  2.3   28.4  Wrist Base 1st Digit     Ulnar Anti Sensory (5th Digit)  29.3C  Wrist  3.5   25.9  Wrist 5th Digit  40    Motor Left/Right Comparison   Stim Site L Lat (ms) R Lat (ms) L-R Lat (ms) L Amp (mV) R Amp (mV) L-R Amp (%) Site1 Site2 L Vel (m/s) R Vel (m/s) L-R Vel (m/s)  Median Motor (Abd Poll Brev)  29C  Wrist  3.4   7.0  Elbow Wrist  51   Elbow  7.6   3.9        Ulnar Motor (Abd Dig Min)  29C  Wrist  3.2   8.1  B Elbow Wrist  59   B Elbow  6.7   7.5  A Elbow B Elbow  69   A Elbow  8.0   7.7           Waveforms:            Clinical History: No specialty comments available.     Objective:  VS:  HT:    WT:   BMI:     BP:   HR: bpm  TEMP: ( )  RESP:  Physical Exam Musculoskeletal:        General: No swelling, tenderness or deformity.     Comments: Inspection reveals no atrophy of the bilateral APB or FDI or hand intrinsics. There is no swelling, color changes, allodynia or dystrophic changes. There is 5 out of 5 strength in the bilateral wrist extension, finger abduction and long finger flexion. There is intact sensation to light touch in all dermatomal and peripheral nerve distributions.  There is a negative Hoffmann's test bilaterally.  Skin:    General: Skin is warm and dry.     Findings: No erythema or rash.  Neurological:     General: No focal deficit present.     Mental Status: She is alert  and oriented to person, place, and time.     Motor: No weakness or abnormal muscle tone.      Coordination: Coordination normal.  Psychiatric:        Mood and Affect: Mood normal.        Behavior: Behavior normal.     Imaging: No results found.

## 2021-03-28 NOTE — Progress Notes (Signed)
Pain, shaking, weakness in right hand. Sometimes has numbness in first, fourth, and fifth fingers. Right hand dominant +Lotion

## 2021-03-29 NOTE — Procedures (Signed)
EMG & NCV Findings: Evaluation of the right median (across palm) sensory nerve showed no response (Palm).  All remaining nerves (as indicated in the following tables) were within normal limits.    All examined muscles (as indicated in the following table) showed no evidence of electrical instability.    Impression: Essentially NORMAL electrodiagnostic study of the right upper limb.  There is no significant electrodiagnostic evidence of nerve entrapment, brachial plexopathy or cervical radiculopathy.    As you know, purely sensory or demyelinating radiculopathies and chemical radiculitis may not be detected with this particular electrodiagnostic study.  Recommendations: 1.  Follow-up with referring physician. 2.  Continue current management of symptoms.  ___________________________ Naaman Plummer FAAPMR Board Certified, American Board of Physical Medicine and Rehabilitation    Nerve Conduction Studies Anti Sensory Summary Table   Stim Site NR Peak (ms) Norm Peak (ms) P-T Amp (V) Norm P-T Amp Site1 Site2 Delta-P (ms) Dist (cm) Vel (m/s) Norm Vel (m/s)  Right Median Acr Palm Anti Sensory (2nd Digit)  29.5C  Wrist    3.4 <3.6 26.2 >10 Wrist Palm  0.0    Palm *NR  <2.0          Right Radial Anti Sensory (Base 1st Digit)  29C  Wrist    2.3 <3.1 28.4  Wrist Base 1st Digit 2.3 0.0    Right Ulnar Anti Sensory (5th Digit)  29.3C  Wrist    3.5 <3.7 25.9 >15.0 Wrist 5th Digit 3.5 14.0 40 >38   Motor Summary Table   Stim Site NR Onset (ms) Norm Onset (ms) O-P Amp (mV) Norm O-P Amp Site1 Site2 Delta-0 (ms) Dist (cm) Vel (m/s) Norm Vel (m/s)  Right Median Motor (Abd Poll Brev)  29C  Wrist    3.4 <4.2 7.0 >5 Elbow Wrist 4.2 21.5 51 >50  Elbow    7.6  3.9         Right Ulnar Motor (Abd Dig Min)  29C  Wrist    3.2 <4.2 8.1 >3 B Elbow Wrist 3.5 20.5 59 >53  B Elbow    6.7  7.5  A Elbow B Elbow 1.3 9.0 69 >53  A Elbow    8.0  7.7          EMG   Side Muscle Nerve Root Ins Act Fibs Psw Amp  Dur Poly Recrt Int Dennie Bible Comment  Right Abd Poll Brev Median C8-T1 Nml Nml Nml Nml Nml 0 Nml Nml   Right 1stDorInt Ulnar C8-T1 Nml Nml Nml Nml Nml 0 Nml Nml   Right PronatorTeres Median C6-7 Nml Nml Nml Nml Nml 0 Nml Nml   Right Biceps Musculocut C5-6 Nml Nml Nml Nml Nml 0 Nml Nml   Right Deltoid Axillary C5-6 Nml Nml Nml Nml Nml 0 Nml Nml     Nerve Conduction Studies Anti Sensory Left/Right Comparison   Stim Site L Lat (ms) R Lat (ms) L-R Lat (ms) L Amp (V) R Amp (V) L-R Amp (%) Site1 Site2 L Vel (m/s) R Vel (m/s) L-R Vel (m/s)  Median Acr Palm Anti Sensory (2nd Digit)  29.5C  Wrist  3.4   26.2  Wrist Palm     Palm             Radial Anti Sensory (Base 1st Digit)  29C  Wrist  2.3   28.4  Wrist Base 1st Digit     Ulnar Anti Sensory (5th Digit)  29.3C  Wrist  3.5   25.9  Wrist  5th Digit  40    Motor Left/Right Comparison   Stim Site L Lat (ms) R Lat (ms) L-R Lat (ms) L Amp (mV) R Amp (mV) L-R Amp (%) Site1 Site2 L Vel (m/s) R Vel (m/s) L-R Vel (m/s)  Median Motor (Abd Poll Brev)  29C  Wrist  3.4   7.0  Elbow Wrist  51   Elbow  7.6   3.9        Ulnar Motor (Abd Dig Min)  29C  Wrist  3.2   8.1  B Elbow Wrist  59   B Elbow  6.7   7.5  A Elbow B Elbow  69   A Elbow  8.0   7.7           Waveforms:

## 2021-03-30 ENCOUNTER — Encounter: Payer: Self-pay | Admitting: Physical Therapy

## 2021-03-30 ENCOUNTER — Other Ambulatory Visit: Payer: Self-pay

## 2021-03-30 ENCOUNTER — Ambulatory Visit (INDEPENDENT_AMBULATORY_CARE_PROVIDER_SITE_OTHER): Payer: BC Managed Care – PPO | Admitting: Physical Therapy

## 2021-03-30 DIAGNOSIS — M6281 Muscle weakness (generalized): Secondary | ICD-10-CM | POA: Diagnosis not present

## 2021-03-30 DIAGNOSIS — M25551 Pain in right hip: Secondary | ICD-10-CM

## 2021-03-30 DIAGNOSIS — M545 Low back pain, unspecified: Secondary | ICD-10-CM | POA: Diagnosis not present

## 2021-03-30 DIAGNOSIS — G8929 Other chronic pain: Secondary | ICD-10-CM | POA: Diagnosis not present

## 2021-03-30 NOTE — Patient Instructions (Signed)
Access Code: [email or web address] URL: [email or web address] Date: 03/30/2021 Prepared by: Moshe Cipro  Exercises Hooklying Single Knee to Chest - 2 x daily - 7 x weekly - 3 reps - 1 sets - 30 sec hold Supine Piriformis Stretch with Foot on Ground - 2 x daily - 7 x weekly - 3 reps - 1 sets - 30 sec hold Sidelying Hip Abduction - 2 x daily - 7 x weekly - 1 sets - 10 reps Supine Bridge - 2 x daily - 7 x weekly - 10 reps - 1 sets - 5 sec hold

## 2021-03-30 NOTE — Therapy (Signed)
[facility] [address] [place], [place], [address] Phone: [phone]   Fax:  [phone]  Physical Therapy Evaluation  Patient Details  Name: Veronica Lynch MRN: [id number] Date of Birth: 28-May-1966 Referring Provider (PT): Kirtland Bouchard, [place]   Encounter Date: 03/30/2021   PT End of Session - 03/30/21 1502     Visit Number 1    Number of Visits 12    Date for PT Re-Evaluation 05/11/21    Authorization Type BCBS state    PT Start Time 1505    PT Stop Time 1538    PT Time Calculation (min) 33 min    Activity Tolerance Patient tolerated treatment well    Behavior During Therapy [facility] for tasks assessed/performed             Past Medical History:  Diagnosis Date   CVA (cerebral vascular accident) (HCC) 2018   CVA (cerebral vascular accident) (HCC) 2017   Diabetes (HCC)    Hyperlipidemia    Hypertension    Shingles     Past Surgical History:  Procedure Laterality Date   CHOLECYSTECTOMY     CHOLECYSTECTOMY  1990   CYST REMOVAL HAND Right    wrist   CYST REMOVAL LEG     left knee   CYST REMOVAL LEG Left    knee   HYSTEROTOMY     LAPAROSCOPIC GASTRIC BANDING     LAPAROSCOPIC GASTRIC BANDING  2010   ROTATOR CUFF REPAIR Left    SHOULDER OPEN ROTATOR CUFF REPAIR     TOTAL ABDOMINAL HYSTERECTOMY      There were no vitals filed for this visit.    Subjective Assessment - 03/30/21 1502     Subjective Pt is a 55 y/o female who presents to OPPT for chronic LBP and Rt hip pain. She reports pain has been for about a year without known injury.  She also reports some Rt hip pain which has greatly improved following injection.  Pain is worse in the AM and improves with movement.    Pertinent History CVA, DM, HTN    Limitations Sitting;Standing;Walking    How long can you sit comfortably? about 1 hour    How long can you stand comfortably? depends on the day    How long can you walk comfortably? depends on day    Diagnostic  tests xrays: some minimal endplate osteophytes at L3-4 and L4-5 otherwise negative for any acute findings    Patient Stated Goals improve pain    Currently in Pain? Yes    Pain Score 0-No pain   up to 8/10   Pain Location Back    Pain Orientation Right;Left;Lower    Pain Descriptors / Indicators Aching;Dull;Pressure    Pain Type Chronic pain    Pain Radiating Towards around waist/hips    Pain Onset More than a month ago    Pain Frequency Intermittent    Aggravating Factors  sitting, standing, walking    Pain Relieving Factors repositioning, medication (bayer back and body), heat                [facility] PT Assessment - 03/30/21 1500       Assessment   Medical Diagnosis M70.61 (ICD-10-CM) - Trochanteric bursitis, right hip  M54.50,G89.29 (ICD-10-CM) - Chronic bilateral low back pain without sciatica    Referring Provider (PT) Kirtland Bouchard, PA-C    Onset Date/Surgical Date --   chronic x 1 year   Hand Dominance Right  Next MD Visit 04/10/21    Prior Therapy none      Precautions   Precautions None      Restrictions   Weight Bearing Restrictions No      Balance Screen   Has the patient fallen in the past 6 months No    Has the patient had a decrease in activity level because of a fear of falling?  No    Is the patient reluctant to leave their home because of a fear of falling?  No      Home Environment   Living Environment Private residence    Living Arrangements Other relatives   sister (sister is Charity fundraiser)   Type of Home House    Home Access Stairs to enter    Entrance Stairs-Number of Steps 1    Home Layout Two level;Bed/bath upstairs    Alternate Level Stairs-Number of Steps 16    Alternate Level Stairs-Rails Left    Additional Comments occasional back pain with stairs (puts whole foot on step when ascending instead of toes only)      Prior Function   Level of Independence Independent    Vocation Full time employment    Copywriter, advertising - seated desk work    Leisure swimming, walking, working out, reading, trips      Cognition   Overall Cognitive Status Within Functional Limits for tasks assessed      Observation/Other Assessments   Focus on Therapeutic Outcomes (FOTO)  54 (predicted 58)      Posture/Postural Control   Posture/Postural Control Postural limitations    Postural Limitations Rounded Shoulders;Forward head      ROM / Strength   AROM / PROM / Strength AROM;Strength      AROM   AROM Assessment Site Lumbar    Lumbar Flexion limited 25% - improved ability to rise on 2nd rep, had to use hands 1st rep    Lumbar Extension WNL - no change with repetition    Lumbar - Right Side Bend WNL    Lumbar - Left Side Bend WNL    Lumbar - Right Rotation WNL    Lumbar - Left Rotation WNL      Strength   Strength Assessment Site Hip;Knee;Ankle    Right/Left Hip Right;Left    Right Hip Flexion 4/5    Right Hip Extension 3/5    Right Hip ABduction 3/5    Left Hip Flexion 3/5    Left Hip Extension 3/5    Left Hip ABduction 3/5    Right/Left Knee Right;Left    Right Knee Flexion 5/5    Right Knee Extension 5/5    Left Knee Flexion 5/5    Left Knee Extension 5/5    Right/Left Ankle Right;Left    Right Ankle Dorsiflexion 5/5    Left Ankle Dorsiflexion 5/5      Flexibility   Soft Tissue Assessment /Muscle Length yes    Piriformis tightness Lt > Rt      Palpation   Palpation comment very tender to light palpation L3-S1/2, and into Rt glute/piriformis out to greater trochanter      Special Tests    Special Tests Lumbar    Lumbar Tests Slump Test      Slump test   Findings Negative                        Objective measurements completed on examination: See above findings.  [facility] Adult PT Treatment/Exercise - 03/30/21 1500       Exercises   Exercises Other Exercises    Other Exercises  see pt instructions - reviewed with pt and answered questions                      PT Education - 03/30/21 1537     Education Details HEP    Person(s) Educated Patient    Methods Explanation;Demonstration;Handout    Comprehension Verbalized understanding;Returned demonstration;Need further instruction              PT Short Term Goals - 03/30/21 1502       PT SHORT TERM GOAL #1   Title independent with initial HEP    Time 3    Period Weeks    Status New    Target Date 04/20/21               PT Long Term Goals - 03/30/21 1503       PT LONG TERM GOAL #1   Title independent with final HEP    Time 6    Period Weeks    Status New    Target Date 05/11/21      PT LONG TERM GOAL #2   Title FOTO score improved to 58 for improved function    Time 6    Period Weeks    Status New    Target Date 05/11/21      PT LONG TERM GOAL #3   Title report pain < 3/10 with activity for improved function    Time 6    Period Weeks    Status New    Target Date 05/11/21      PT LONG TERM GOAL #4   Title perform lumbar flexion and return to standing without increase in pain or difficulty for improved function    Time 6    Period Weeks    Status New    Target Date 05/11/21      PT LONG TERM GOAL #5   Title demonstrate 4/5 bil hip strength for improved function    Time 6    Period Weeks    Status New    Target Date 05/11/21                    Plan - 03/30/21 1541     Clinical Impression Statement Pt is a 55 y/o female who presents to OPPT for chronic LBP and Rt hip pain.  Pt demonstrates decreased ROM and strength as well as decreased flexibility and postural abnormalities.  Pt will benefit from PT to address deficits listed.    Personal Factors and Comorbidities Comorbidity 3+    Comorbidities CVA, DM, HTN    Examination-Activity Limitations Transfers;Sit;Stand;Lift;Stairs;Locomotion Level    Examination-Participation Restrictions Community Activity;Occupation    Stability/Clinical Decision Making Evolving/Moderate  complexity    Clinical Decision Making Moderate    Rehab Potential Good    PT Frequency 2x / week    PT Duration 6 weeks    PT Treatment/Interventions ADLs/Self Care Home Management;Cryotherapy;Electrical Stimulation;Traction;Moist Heat;Gait training;Stair training;Functional mobility training;Therapeutic activities;Therapeutic exercise;Neuromuscular re-education;Patient/family education;Manual techniques;Dry needling;Taping    PT Next Visit Plan review HEP, progress hip/core strength, manual/modalities PRN    PT Home Exercise Plan Access Code: [email or web address]    Consulted and Agree with Plan of Care Patient             Patient will benefit from skilled therapeutic intervention in order  to improve the following deficits and impairments:  Pain, Postural dysfunction, Increased muscle spasms, Increased fascial restricitons, Decreased range of motion, Decreased strength, Impaired flexibility, Decreased mobility  Visit Diagnosis: Chronic bilateral low back pain without sciatica - Plan: PT plan of care cert/re-cert  Muscle weakness (generalized) - Plan: PT plan of care cert/re-cert  Pain in right hip - Plan: PT plan of care cert/re-cert     Problem List Patient Active Problem List   Diagnosis Date Noted   Coronary artery disease of native artery of native heart with stable angina pectoris (HCC) 12/17/2020   Statin intolerance 12/17/2020   Essential hypertension 11/17/2020   Mixed hyperlipidemia 11/17/2020   Palpitations 11/17/2020   Exertional chest pain 11/17/2020   Exertional dyspnea 11/17/2020     Clarita Crane, PT, DPT 03/30/21 3:48 PM    [facility] [address] [place], [place], [address] Phone: [phone]   Fax:  [phone]  Name: Veronica Lynch MRN: [id number] Date of Birth: 05-23-1966

## 2021-03-31 ENCOUNTER — Other Ambulatory Visit: Payer: Self-pay | Admitting: Cardiology

## 2021-03-31 ENCOUNTER — Encounter: Payer: Self-pay | Admitting: Cardiology

## 2021-03-31 ENCOUNTER — Ambulatory Visit: Payer: BC Managed Care – PPO | Admitting: Cardiology

## 2021-03-31 VITALS — BP 144/67 | HR 91 | Temp 97.8°F | Resp 16 | Ht 66.0 in | Wt 203.0 lb

## 2021-03-31 DIAGNOSIS — I25118 Atherosclerotic heart disease of native coronary artery with other forms of angina pectoris: Secondary | ICD-10-CM

## 2021-03-31 DIAGNOSIS — Z789 Other specified health status: Secondary | ICD-10-CM

## 2021-03-31 DIAGNOSIS — E782 Mixed hyperlipidemia: Secondary | ICD-10-CM

## 2021-03-31 DIAGNOSIS — M609 Myositis, unspecified: Secondary | ICD-10-CM | POA: Insufficient documentation

## 2021-03-31 MED ORDER — ISOSORBIDE MONONITRATE ER 30 MG PO TB24: 30.0000 mg | ORAL_TABLET | Freq: Every day | ORAL | 3 refills | Status: DC

## 2021-03-31 MED ORDER — REPATHA SURECLICK 140 MG/ML ~~LOC~~ SOAJ: 140.0000 mg | SUBCUTANEOUS | 5 refills | Status: DC

## 2021-03-31 NOTE — Progress Notes (Addendum)
Patient referred by Lucianne Lei, MD for hypertension  Subjective:   Veronica Lynch, female    DOB: Oct 01, 1965, 55 y.o.   MRN: [id number]   Chief Complaint  Patient presents with   Coronary artery disease of native artery of native heart wi   Follow-up    3 month     55 y.o. African American female with hypertension, mixed hyperlipidemia, h/o stroke (2018), CAD with stable angina  Patient reports having "good days", and "not so good days".  On good days, she can walk up to 5 to 6 miles without any symptoms of chest pain or shortness of breath.  On multiple days, she has chest and jaw pain with walking that improves with rest.  She has had to take nitroglycerin twice with episodes of similar chest and jaw pain that woke her up from sleep and resolved with nitroglycerin.  She has not been on Repatha due to issues related to insurance coverage.  Patient has clearly had had myalgias symptoms with several different statins in the past.  Initial consultation HPI 11/2020: Patient works as an Web designer at [employer].  She moved from [place] to [place].  Prior to that, she lived in [place].  In 2017, patient had a stroke, fortunately with no significant residual deficit, following a lap band procedure. She was admitted at [facility] and then transferred to [facility].  More recently, she is experienced retrosternal chest pain and shortness of breath with walking.  She used to walk up to 10 miles a week, but this is down significantly in the last few months due to the above symptoms.  She denies any presyncope syncope, orthopnea, PND.  She has occasional leg edema.  Her sister has noticed that patient's heart rate has been irregular.  Patient had an EKG performed at her PCP office, copy of which is not legible.  I will request another copy.     Current Outpatient Medications on File Prior to Visit  Medication Sig Dispense  Refill   Ascorbic Acid (VITAMIN C) 1000 MG tablet Take 1,000 mg by mouth daily.     aspirin EC 81 MG tablet Take 1 tablet (81 mg total) by mouth daily. Swallow whole. 90 tablet 3   Biotin 10 MG TABS Take by mouth daily at 6 (six) AM.     celecoxib (CELEBREX) 200 MG capsule Take 200 mg by mouth daily.     Cholecalciferol (VITAMIN D3) 125 MCG (5000 UT) CAPS Take by mouth daily at 6 (six) AM.     TRUE METRIX BLOOD GLUCOSE TEST test strip 2 (two) times daily.     metoprolol succinate (TOPROL-XL) 25 MG 24 hr tablet Take 1 tablet (25 mg total) by mouth daily. Take with or immediately following a meal. 90 tablet 3   nitroGLYCERIN (NITROSTAT) 0.4 MG SL tablet Place 1 tablet (0.4 mg total) under the tongue every 5 (five) minutes as needed for chest pain. 90 tablet 3   No current facility-administered medications on file prior to visit.    Cardiovascular and other pertinent studies:  CT Cardiac scoring 11/28/2020: LM: 0 LAD: 11 LCx: 10 RCA: 0 Total score: 21 (88th percentile)  Mobile cardiac telemetry 5 days 11/23/2020 - 11/28/2020: Dominant rhythm: Sinus. HR 60-135 bpm. Avg HR 91 bpm. 0 episodes of SVT 3.3% isolated SVE, 1.7% couplets, <1%triplets. 0 episodes of VT <% isolated VE, no couplet/triplets. No atrial fibrillation/atrial flutter/SVT/VT/high grade AV block,  sinus pause >3sec noted. 0 patient triggered events.   Echocardiogram 11/23/2020:  Normal LV systolic function with visual EF 60-65%. Left ventricle cavity  is normal in size. Normal global wall motion. Normal diastolic filling  pattern, normal LAP.  Trace tricuspid regurgitation. No evidence of pulmonary hypertension.  No prior study for comparison.  Exercise Sestamibi stress test 11/21/2020: Exercise nuclear stress test was performed using Bruce protocol. Patient reached 7.3 METS, and 82% of age predicted maximum heart rate. Exercise capacity was low. No chest pain reported. Exertional dyspnea and dizziness reported. Heart rate  and hemodynamic response were normal. Stress EKG revealed no ischemic changes. SPECT images showed small size, mild intensity, reversible perfusion defect in apical to basal, inferior myocardium.  Stress LVEF 62%. Low risk study.  11/2016 Acute CVA s/p TPA   MRI brain 11/2016:  Normal   CTA head/neck 11/2016: No carotid stenosis Small thrombus noted in Rt M2 segment, too small for safe retrieval   Echocardiogram 11/2016: Normal LV size. LVEF 55-65%. Mild LVH. Normal diastolic function.    Recent labs: 10/22/2020: Glucose 113, BUN/Cr 11/0.70. EGFR 98. Na/K 141/4.1. Rest of the CMP normal H/H 12.5/37.8 MCV 85.7. Platelets 246 HbA1C 7.0% Chol 247, TG 184, HDL 57, LDL 156 TSH 0.92 normal    Review of Systems  Cardiovascular:  Positive for chest pain. Negative for dyspnea on exertion, leg swelling, palpitations and syncope.        Vitals:   03/31/21 1130  BP: (!) 144/67  Pulse: 91  Resp: 16  Temp: 97.8 F (36.6 C)  SpO2: 97%     Body mass index is 32.77 kg/m. Filed Weights   03/31/21 1130  Weight: 203 lb (92.1 kg)     Objective:   Physical Exam Vitals and nursing note reviewed.  Constitutional:      General: She is not in acute distress. Neck:     Vascular: No JVD.  Cardiovascular:     Rate and Rhythm: Regular rhythm. Tachycardia present.     Pulses: Normal pulses.     Heart sounds: Normal heart sounds. No murmur heard. Pulmonary:     Effort: Pulmonary effort is normal.     Breath sounds: Normal breath sounds. No wheezing or rales.  Musculoskeletal:     Right lower leg: No edema.     Left lower leg: No edema.        Assessment & Recommendations:   55 y.o. African American female with hypertension, mixed hyperlipidemia, h/o stroke (2018), CAD with stable angina  CAD: Calcium score 21. Mild ischemia on stress testing (11/2020). Patient still has angina symptoms.  Continue metoprolol succinate 100 mg daily, added Imdur 30 mg daily. Continue aspirin 81  mg daily. We will seek prior authorization for Repatha given patient's history of statin reduce myalgias and myositis. If anginal symptoms do not improve with above medical therapy, will then recommend coronary angiography and possible intervention.  Hypertension: Controlled  Mixed hyperlipidemia: Management as above   Nigel Mormon, MD Pager: [phone] Office: [phone]

## 2021-04-05 ENCOUNTER — Ambulatory Visit: Payer: BC Managed Care – PPO | Admitting: Physician Assistant

## 2021-04-06 ENCOUNTER — Encounter: Payer: Self-pay | Admitting: Physical Therapy

## 2021-04-06 ENCOUNTER — Ambulatory Visit (INDEPENDENT_AMBULATORY_CARE_PROVIDER_SITE_OTHER): Payer: BC Managed Care – PPO | Admitting: Physical Therapy

## 2021-04-06 ENCOUNTER — Other Ambulatory Visit: Payer: Self-pay

## 2021-04-06 DIAGNOSIS — M6281 Muscle weakness (generalized): Secondary | ICD-10-CM | POA: Diagnosis not present

## 2021-04-06 DIAGNOSIS — G8929 Other chronic pain: Secondary | ICD-10-CM

## 2021-04-06 DIAGNOSIS — M25551 Pain in right hip: Secondary | ICD-10-CM

## 2021-04-06 DIAGNOSIS — M545 Low back pain, unspecified: Secondary | ICD-10-CM | POA: Diagnosis not present

## 2021-04-06 NOTE — Telephone Encounter (Signed)
Can it be switched to praluent?

## 2021-04-06 NOTE — Therapy (Signed)
[facility] [address] [place], [place], [address] Phone: [phone]   Fax:  [phone]  Physical Therapy Treatment  Patient Details  Name: Veronica Lynch MRN: [id number] Date of Birth: 16-May-1966 Referring Provider (PT): Pete Pelt, [place]   Encounter Date: 04/06/2021   PT End of Session - 04/06/21 1555     Visit Number 2    Number of Visits 12    Date for PT Re-Evaluation 05/11/21    Authorization Type BCBS state    PT Start Time 0786    PT Stop Time 7544    PT Time Calculation (min) 38 min    Activity Tolerance Patient tolerated treatment well    Behavior During Therapy [facility] for tasks assessed/performed             Past Medical History:  Diagnosis Date   CVA (cerebral vascular accident) ([place]) 2018   CVA (cerebral vascular accident) ([place]) 2017   Diabetes ([place])    Hyperlipidemia    Hypertension    Shingles     Past Surgical History:  Procedure Laterality Date   CHOLECYSTECTOMY     CHOLECYSTECTOMY  1990   CYST REMOVAL HAND Right    wrist   CYST REMOVAL LEG     left knee   CYST REMOVAL LEG Left    knee   HYSTEROTOMY     LAPAROSCOPIC GASTRIC BANDING     LAPAROSCOPIC GASTRIC BANDING  2010   ROTATOR CUFF REPAIR Left    SHOULDER OPEN ROTATOR CUFF REPAIR     TOTAL ABDOMINAL HYSTERECTOMY      There were no vitals filed for this visit.   Subjective Assessment - 04/06/21 1516     Subjective doing HEP every morning, feels like she is a little looser    Pertinent History CVA, DM, HTN    Limitations Sitting;Standing;Walking    How long can you sit comfortably? about 1 hour    How long can you stand comfortably? depends on the day    How long can you walk comfortably? depends on day    Diagnostic tests xrays: some minimal endplate osteophytes at L3-4 and L4-5 otherwise negative for any acute findings    Patient Stated Goals improve pain    Currently in Pain? No/denies    Pain Score 0-No pain                                [facility] Adult PT Treatment/Exercise - 04/06/21 1517       Exercises   Exercises Lumbar      Lumbar Exercises: Stretches   Single Knee to Chest Stretch Right;Left;3 reps;30 seconds    Piriformis Stretch Right;Left;3 reps;30 seconds    Other Lumbar Stretch Exercise seated mid back stretch to Lt 2x30 sec      Lumbar Exercises: Aerobic   Nustep L6 x 7 min      Lumbar Exercises: Supine   Pelvic Tilt 10 reps;5 seconds    Clam 10 reps   bil; with pelvic tilt   Bridge 10 reps;5 seconds      Lumbar Exercises: Sidelying   Hip Abduction Both;10 reps                       PT Short Term Goals - 04/06/21 1555       PT SHORT TERM GOAL #1   Title independent with initial HEP  Time 3    Period Weeks    Status Achieved    Target Date 04/20/21               PT Long Term Goals - 04/06/21 1555       PT LONG TERM GOAL #1   Title independent with final HEP    Time 6    Period Weeks    Status On-going    Target Date 05/11/21      PT LONG TERM GOAL #2   Title FOTO score improved to 58 for improved function    Time 6    Period Weeks    Status On-going    Target Date 05/11/21      PT LONG TERM GOAL #3   Title report pain < 3/10 with activity for improved function    Time 6    Period Weeks    Status On-going    Target Date 05/11/21      PT LONG TERM GOAL #4   Title perform lumbar flexion and return to standing without increase in pain or difficulty for improved function    Time 6    Period Weeks    Status On-going    Target Date 05/11/21      PT LONG TERM GOAL #5   Title demonstrate 4/5 bil hip strength for improved function    Time 6    Period Weeks    Status On-going    Target Date 05/11/21                   Plan - 04/06/21 1555     Clinical Impression Statement Pt tolerated session well today and able to progress HEP to some core exercises.  She has met STG #1 at this time and will continue to  benefit from PT to maximize function.    Personal Factors and Comorbidities Comorbidity 3+    Comorbidities CVA, DM, HTN    Examination-Activity Limitations Transfers;Sit;Stand;Lift;Stairs;Locomotion Level    Examination-Participation Restrictions Community Activity;Occupation    Stability/Clinical Decision Making Evolving/Moderate complexity    Rehab Potential Good    PT Frequency 2x / week    PT Duration 6 weeks    PT Treatment/Interventions ADLs/Self Care Home Management;Cryotherapy;Electrical Stimulation;Traction;Moist Heat;Gait training;Stair training;Functional mobility training;Therapeutic activities;Therapeutic exercise;Neuromuscular re-education;Patient/family education;Manual techniques;Dry needling;Taping    PT Next Visit Plan review core HEP, progress hip/core strength, manual/modalities PRN    PT Home Exercise Plan Access Code: [email or web address]    Consulted and Agree with Plan of Care Patient             Patient will benefit from skilled therapeutic intervention in order to improve the following deficits and impairments:  Pain, Postural dysfunction, Increased muscle spasms, Increased fascial restricitons, Decreased range of motion, Decreased strength, Impaired flexibility, Decreased mobility  Visit Diagnosis: Chronic bilateral low back pain without sciatica  Muscle weakness (generalized)  Pain in right hip     Problem List Patient Active Problem List   Diagnosis Date Noted   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

## 2021-04-07 ENCOUNTER — Telehealth: Payer: Self-pay | Admitting: Cardiology

## 2021-04-07 NOTE — Telephone Encounter (Signed)
Patient says pharmacy is requesting to speak with you about repatha medication.

## 2021-04-07 NOTE — Telephone Encounter (Signed)
Ok

## 2021-04-07 NOTE — Telephone Encounter (Signed)
You may transfer call to Mas or me.  Thanks MJP

## 2021-04-10 ENCOUNTER — Other Ambulatory Visit: Payer: Self-pay

## 2021-04-10 ENCOUNTER — Ambulatory Visit: Payer: BC Managed Care – PPO | Admitting: Physician Assistant

## 2021-04-10 ENCOUNTER — Ambulatory Visit: Payer: Self-pay

## 2021-04-10 ENCOUNTER — Encounter: Payer: Self-pay | Admitting: Physician Assistant

## 2021-04-10 DIAGNOSIS — M25531 Pain in right wrist: Secondary | ICD-10-CM

## 2021-04-10 DIAGNOSIS — M7062 Trochanteric bursitis, left hip: Secondary | ICD-10-CM

## 2021-04-10 DIAGNOSIS — M25561 Pain in right knee: Secondary | ICD-10-CM

## 2021-04-10 DIAGNOSIS — M654 Radial styloid tenosynovitis [de Quervain]: Secondary | ICD-10-CM | POA: Diagnosis not present

## 2021-04-10 DIAGNOSIS — M7061 Trochanteric bursitis, right hip: Secondary | ICD-10-CM | POA: Diagnosis not present

## 2021-04-10 MED ORDER — METHYLPREDNISOLONE ACETATE 40 MG/ML IJ SUSP
40.0000 mg | INTRAMUSCULAR | Status: AC | PRN
  Administered 2021-04-10: 40 mg via INTRA_ARTICULAR

## 2021-04-10 MED ORDER — LIDOCAINE HCL 1 % IJ SOLN
1.0000 mL | INTRAMUSCULAR | Status: AC | PRN
  Administered 2021-04-10: 1 mL

## 2021-04-10 MED ORDER — METHYLPREDNISOLONE ACETATE 40 MG/ML IJ SUSP
40.0000 mg | INTRAMUSCULAR | Status: AC | PRN
Start: 2021-04-10 — End: 2021-04-10
  Administered 2021-04-10: 40 mg

## 2021-04-10 MED ORDER — LIDOCAINE HCL 1 % IJ SOLN
3.0000 mL | INTRAMUSCULAR | Status: AC | PRN
  Administered 2021-04-10: 3 mL

## 2021-04-10 NOTE — Progress Notes (Signed)
HPI: [name] returns today to go over the EMG nerve conduction studies of her upper extremities.  Also for reevaluation bilateral hip pain and right wrist pain.  She feels the injection for the first extensor compartment pain she was having to help with numbness tingling in the hand and that that the numbness tingling is gone at this point.  She still has some soreness over the first extensor compartment but this is also improved.  She still having pain in the wrist whenever she pushes up on the wrist.  States the pain is globally throughout the wrist.  However she states the pain in the wrist is overall 50% better than it was prior to the injection for de Quervain's syndrome.  Again she had a ganglion cyst excised in 2011 in [facility].  EMG nerve conduction studies were negative for carpal tunnel syndrome and were read as normal on the right. In regards to the right hip pain this is improved.  She has had a visit with physical therapy and feels that there is may have helped but the hip injection on the right definitely helped.  She is also having now some left hip pain laterally.  Denies any numbness tingling down either leg.  Review of systems see HPI otherwise negative  Physical exam: General well-developed well-nourished female no acute distress ambulates without any assistive device. Psych: Alert and oriented x3 Bilateral hands: Full motor full sensation.  Radial pulses are 2+ bilaterally equal symmetric.  Volar incision lateral distal ulna region with slight keloid.  She has tenderness with palpation over the incision site.  Right wrist full range of motion without pain.  Finkelstein's is negative on the right.  Slight tenderness over the first extensor compartment on the right.  No rashes skin lesions ulcerations involving the right wrist or hand otherwise. Bilateral hips: Good range of motion of both hips without pain.  She has tenderness over the trochanteric region both hips left  greater than right.  Impression: Bilateral trochanteric bursitis De Quervain's right wrist improved Right wrist pain  Plan: Have her continue physical therapy for IT band stretching both hips and for modalities.  Regards to the wrist we will have her begin applying Voltaren gel up to 2 g 4 times daily to the wrist particularly over the distal ulnar region.  Also she can apply the 2 g up to 4 times a day over the first extensor compartment.  Have her follow-up with Veronica Lynch for her chronic wrist pain.  Questions encouraged and answered at length.

## 2021-04-11 ENCOUNTER — Other Ambulatory Visit: Payer: Self-pay

## 2021-04-11 DIAGNOSIS — I25118 Atherosclerotic heart disease of native coronary artery with other forms of angina pectoris: Secondary | ICD-10-CM

## 2021-04-11 MED ORDER — PRALUENT 75 MG/ML ~~LOC~~ SOAJ: 1.0000 mL | SUBCUTANEOUS | 3 refills | Status: DC

## 2021-04-13 ENCOUNTER — Ambulatory Visit: Payer: BC Managed Care – PPO | Admitting: Orthopedic Surgery

## 2021-04-13 ENCOUNTER — Ambulatory Visit: Payer: Self-pay

## 2021-04-13 ENCOUNTER — Encounter: Payer: Self-pay | Admitting: Orthopedic Surgery

## 2021-04-13 VITALS — BP 122/80 | HR 90

## 2021-04-13 DIAGNOSIS — M654 Radial styloid tenosynovitis [de Quervain]: Secondary | ICD-10-CM

## 2021-04-13 DIAGNOSIS — M25531 Pain in right wrist: Secondary | ICD-10-CM

## 2021-04-13 NOTE — Progress Notes (Signed)
Office Visit Note   Patient: Veronica Lynch           Date of Birth: 04/09/1966           MRN: [id number] Visit Date: 04/13/2021              Requested by: Dorothyann Peng, MD [address] [place],  [place] [address] PCP: Dorothyann Peng, MD   Assessment & Plan: Visit Diagnoses:  1. Pain in right wrist   2. De Quervain's tenosynovitis, right     Plan: Discussed with patient that her Tommi Rumps Qervain's tenosynovitis symptoms seem to be improving with the CSI.  I can't explain the rest of her wrist pain.  She has pain at multiple points around her wrist including the foveal area, the dorsal wrist at SL interval, and volar wrist around the FCR tendon.  She has no significant provocative test findings and full/painless wrist ROM and forearm rotation. We discussed the role of hand therapy in strengthening and conditioning for improve wrist stability.  She has putty and other hand therapy modalities at home from her previous volar ganglion surgery.  We also discussed getting and MRI to further evaluate the wrist for possible structural injury.  She wants to proceed with an MRI.  We will see her back in the office once the study is completed.   Follow-Up Instructions: No follow-ups on file.   Orders:  Orders Placed This Encounter  Procedures   XR Wrist Complete Right   No orders of the defined types were placed in this encounter.     Procedures: No procedures performed   Clinical Data: No additional findings.   Subjective: Chief Complaint  Patient presents with   Right Hand - New Patient (Initial Visit)    This is a 54 yo RHD F who presents w/ diffuse and chronic R wrist pain.  She is s/p volar ulnar ganglion excision some years back.  She was recently seen in our office for R de Quervain's tenosynovitis and is improving somewhat after a CSI.  The rest of her wrist pain is diffuse, poorly characterized and poorly localized.  She points to her ulnar, dorsal and radial wrist  as sources of pain. Her pain is both dull and sharp.  It is worse w/ certain activities like getting up from a seated position or with hyper extension.  She has worn a compression glove with minimal symptom relief.  She takes celebrex for chronic back pain. She denies history of injury to this wrist.  Recent electrodiagnostic study negative for compressive neuropathy.    Review of Systems  Constitutional: Negative.   Respiratory: Negative.    Cardiovascular: Negative.   Skin: Negative.   Neurological: Negative.     Objective: Vital Signs: BP 122/80 (BP Location: Left Arm, Patient Position: Sitting, Cuff Size: Normal)   Pulse 90   SpO2 97%   Physical Exam Constitutional:      Appearance: Normal appearance.  Cardiovascular:     Rate and Rhythm: Normal rate.     Pulses: Normal pulses.  Skin:    General: Skin is warm and dry.     Capillary Refill: Capillary refill takes less than 2 seconds.  Neurological:     General: No focal deficit present.     Mental Status: She is alert.    Right Hand Exam   Tenderness  Right hand tenderness location: TTP at fovea, Lister's tubercle, first dorsal compartment, FCR tendon.  Range of Motion  Wrist  Extension:  50  Flexion:  40  Pronation:  90  Supination:  90   Muscle Strength  Wrist extension: 5/5   Other  Erythema: absent Sensation: normal Pulse: present  Comments:  Neg ECU synergy test.  No pain w/ dart thrower's motion.  Neg pisotriquetral grind.  No DRUJ instability.  No pain over SL interval. No pain w/ resisted wrist flexion.      Specialty Comments:  No specialty comments available.  Imaging: 3V of the right wrist taken today are reviewed and interpeted by me.  They demonstrate mild degenerative changes at the radioscaphoid and thumb CMC joint but no acute bony injury or instability.    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   Coronary artery disease of native artery of native heart with stable angina pectoris ([facility]) 12/17/2020   Statin intolerance 12/17/2020   Essential hypertension 11/17/2020   Mixed hyperlipidemia 11/17/2020   Palpitations 11/17/2020   Exertional chest pain 11/17/2020   Exertional dyspnea 11/17/2020   Past Medical History:  Diagnosis Date   CVA (cerebral vascular accident) ([facility]) 2018   CVA (cerebral vascular accident) ([facility]) 2017   Diabetes ([facility])    Hyperlipidemia    Hypertension    Shingles     Family History  Problem Relation Age of Onset   Hypertension Mother    Hyperlipidemia Mother    Osteoporosis Mother    Cancer Father    Sarcoidosis Sister    Diabetes Maternal Aunt     Past Surgical History:  Procedure Laterality Date   CHOLECYSTECTOMY     CHOLECYSTECTOMY  1990   CYST REMOVAL HAND Right    wrist   CYST REMOVAL LEG     left knee   CYST REMOVAL LEG Left    knee   HYSTEROTOMY     LAPAROSCOPIC GASTRIC BANDING     LAPAROSCOPIC GASTRIC BANDING  2010   ROTATOR CUFF REPAIR Left    SHOULDER OPEN ROTATOR CUFF REPAIR     TOTAL ABDOMINAL HYSTERECTOMY     Social History   Occupational History   Not on file  Tobacco Use   Smoking status: Never   Smokeless tobacco: Never  Vaping Use   Vaping Use: Never used  Substance and Sexual Activity   Alcohol use: Not Currently    Comment: occ wine   Drug use: Never   Sexual activity: Not on file

## 2021-04-14 ENCOUNTER — Encounter: Payer: BC Managed Care – PPO | Admitting: Rehabilitative and Restorative Service Providers"

## 2021-04-18 ENCOUNTER — Ambulatory Visit: Payer: BC Managed Care – PPO | Admitting: Orthopedic Surgery

## 2021-04-19 ENCOUNTER — Ambulatory Visit: Payer: BC Managed Care – PPO | Admitting: Physical Therapy

## 2021-04-19 ENCOUNTER — Encounter: Payer: Self-pay | Admitting: Physical Therapy

## 2021-04-19 ENCOUNTER — Other Ambulatory Visit: Payer: Self-pay

## 2021-04-19 DIAGNOSIS — M6281 Muscle weakness (generalized): Secondary | ICD-10-CM | POA: Diagnosis not present

## 2021-04-19 DIAGNOSIS — M545 Low back pain, unspecified: Secondary | ICD-10-CM | POA: Diagnosis not present

## 2021-04-19 DIAGNOSIS — M25551 Pain in right hip: Secondary | ICD-10-CM

## 2021-04-19 DIAGNOSIS — G8929 Other chronic pain: Secondary | ICD-10-CM

## 2021-04-19 NOTE — Therapy (Addendum)
[facility] [address] [place], [place], [address] Phone: [phone]   Fax:  [phone]  Physical Therapy Treatment /Discharge   Patient Details  Name: MAISYN NOURI MRN: [id number] Date of Birth: Jul 12, 1966 Referring Provider (PT): Pete Pelt, [place]   Encounter Date: 04/19/2021   PT End of Session - 04/19/21 1551     Visit Number 3    Number of Visits 12    Date for PT Re-Evaluation 05/11/21    Authorization Type BCBS state    PT Start Time 1312    PT Stop Time 1350    PT Time Calculation (min) 38 min    Activity Tolerance Patient tolerated treatment well    Behavior During Therapy [facility] for tasks assessed/performed             Past Medical History:  Diagnosis Date   CVA (cerebral vascular accident) ([place]) 2018   CVA (cerebral vascular accident) ([place]) 2017   Diabetes ([place])    Hyperlipidemia    Hypertension    Shingles     Past Surgical History:  Procedure Laterality Date   CHOLECYSTECTOMY     CHOLECYSTECTOMY  1990   CYST REMOVAL HAND Right    wrist   CYST REMOVAL LEG     left knee   CYST REMOVAL LEG Left    knee   HYSTEROTOMY     LAPAROSCOPIC GASTRIC BANDING     LAPAROSCOPIC GASTRIC BANDING  2010   ROTATOR CUFF REPAIR Left    SHOULDER OPEN ROTATOR CUFF REPAIR     TOTAL ABDOMINAL HYSTERECTOMY      There were no vitals filed for this visit.   Subjective Assessment - 04/19/21 1514     Subjective consistently doing HEP, feels her pain is decreasing in intensity and having longer periods of less pain.    Pertinent History CVA, DM, HTN    Limitations Sitting;Standing;Walking    How long can you sit comfortably? about 1 hour    How long can you stand comfortably? depends on the day    How long can you walk comfortably? depends on day    Diagnostic tests xrays: some minimal endplate osteophytes at L3-4 and L4-5 otherwise negative for any acute findings    Patient Stated Goals improve pain    Currently in  Pain? No/denies                               [facility] Adult PT Treatment/Exercise - 04/19/21 0001       Lumbar Exercises: Stretches   Single Knee to Chest Stretch Right;Left;3 reps;30 seconds    Double Knee to Chest Stretch Limitations rocking/circles 3x10 reps    Piriformis Stretch Right;Left;3 reps;30 seconds      Lumbar Exercises: Aerobic   Nustep L6 x 8 min      Lumbar Exercises: Machines for Strengthening   Leg Press 100# 3x10      Lumbar Exercises: Standing   Row Both;Theraband   3x10   Theraband Level (Row) Level 3 (Green)    Other Standing Lumbar Exercises RDL 12# KB 3x10                       PT Short Term Goals - 04/06/21 1555       PT SHORT TERM GOAL #1   Title independent with initial HEP    Time 3    Period Weeks  Status Achieved    Target Date 04/20/21               PT Long Term Goals - 04/06/21 1555       PT LONG TERM GOAL #1   Title independent with final HEP    Time 6    Period Weeks    Status On-going    Target Date 05/11/21      PT LONG TERM GOAL #2   Title FOTO score improved to 58 for improved function    Time 6    Period Weeks    Status On-going    Target Date 05/11/21      PT LONG TERM GOAL #3   Title report pain < 3/10 with activity for improved function    Time 6    Period Weeks    Status On-going    Target Date 05/11/21      PT LONG TERM GOAL #4   Title perform lumbar flexion and return to standing without increase in pain or difficulty for improved function    Time 6    Period Weeks    Status On-going    Target Date 05/11/21      PT LONG TERM GOAL #5   Title demonstrate 4/5 bil hip strength for improved function    Time 6    Period Weeks    Status On-going    Target Date 05/11/21                   Plan - 04/19/21 1552     Clinical Impression Statement Session today incorporating more strengthening exercises which pt tolerated well without increase in pain.  Will  continue to benefit from PT to maximize function.    Personal Factors and Comorbidities Comorbidity 3+    Comorbidities CVA, DM, HTN    Examination-Activity Limitations Transfers;Sit;Stand;Lift;Stairs;Locomotion Level    Examination-Participation Restrictions Community Activity;Occupation    Stability/Clinical Decision Making Evolving/Moderate complexity    Rehab Potential Good    PT Frequency 2x / week    PT Duration 6 weeks    PT Treatment/Interventions ADLs/Self Care Home Management;Cryotherapy;Electrical Stimulation;Traction;Moist Heat;Gait training;Stair training;Functional mobility training;Therapeutic activities;Therapeutic exercise;Neuromuscular re-education;Patient/family education;Manual techniques;Dry needling;Taping    PT Next Visit Plan review core HEP PRN, progress hip/core strength, manual/modalities PRN    PT Home Exercise Plan Access Code: [email or web address]    Consulted and Agree with Plan of Care Patient             Patient will benefit from skilled therapeutic intervention in order to improve the following deficits and impairments:  Pain, Postural dysfunction, Increased muscle spasms, Increased fascial restricitons, Decreased range of motion, Decreased strength, Impaired flexibility, Decreased mobility  Visit Diagnosis: Chronic bilateral low back pain without sciatica  Muscle weakness (generalized)  Pain in right hip     Problem List Patient Active Problem List   Diagnosis Date Noted   De Quervain's tenosynovitis, right 04/13/2021   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 Patient goals were partially met. Patient is being discharged due to not returning since the last visit. Scot Jun, PT, DPT, OCS, ATC 06/27/21  9:53 AM     [facility] Physical Therapy [address] [place], [place], [address] Phone: [phone]   Fax:  [phone]  Name: DERRICKA MERTZ MRN: [id number] Date of Birth: 08-Jun-1966

## 2021-04-24 ENCOUNTER — Encounter: Payer: BC Managed Care – PPO | Admitting: Physical Therapy

## 2021-04-28 ENCOUNTER — Ambulatory Visit: Payer: BC Managed Care – PPO | Admitting: Cardiology

## 2021-04-28 ENCOUNTER — Encounter: Payer: Self-pay | Admitting: Cardiology

## 2021-04-28 ENCOUNTER — Other Ambulatory Visit: Payer: Self-pay

## 2021-04-28 VITALS — BP 126/71 | HR 89 | Resp 16 | Ht 66.0 in | Wt 203.6 lb

## 2021-04-28 DIAGNOSIS — I25118 Atherosclerotic heart disease of native coronary artery with other forms of angina pectoris: Secondary | ICD-10-CM

## 2021-04-28 DIAGNOSIS — Z789 Other specified health status: Secondary | ICD-10-CM

## 2021-04-28 MED ORDER — METOPROLOL SUCCINATE ER 50 MG PO TB24: 50.0000 mg | ORAL_TABLET | Freq: Every day | ORAL | 3 refills | Status: DC

## 2021-04-28 NOTE — Progress Notes (Signed)
Patient referred by Glendale Chard, MD for hypertension  Subjective:   Veronica Lynch, female    DOB: 1966-01-18, 55 y.o.   MRN: [id number]   Chief Complaint  Patient presents with   Chest Pain   Follow-up     55 y.o. African American female with hypertension, mixed hyperlipidemia, h/o stroke (2018), CAD with stable angina  Patient continues to have chest pain and fatigue with activity. Pain has not improved on current anti anginal therapy. Sometimes, pain improves with NTG, but can last up to 1 hour. She has   Initial consultation HPI 11/2020: Patient works as an Web designer at [employer].  She moved from [place] to [place].  Prior to that, she lived in [place].  In 2017, patient had a stroke, fortunately with no significant residual deficit, following a lap band procedure. She was admitted at [facility] and then transferred to [facility] at [facility].  More recently, she is experienced retrosternal chest pain and shortness of breath with walking.  She used to walk up to 10 miles a week, but this is down significantly in the last few months due to the above symptoms.  She denies any presyncope syncope, orthopnea, PND.  She has occasional leg edema.  Her sister has noticed that patient's heart rate has been irregular.  Patient had an EKG performed at her PCP office, copy of which is not legible.  I will request another copy.     Current Outpatient Medications on File Prior to Visit  Medication Sig Dispense Refill   Alirocumab (PRALUENT) 75 MG/ML SOAJ Inject 1 mL into the skin every 14 (fourteen) days. 2 mL 3   Ascorbic Acid (VITAMIN C) 1000 MG tablet Take 1,000 mg by mouth daily.     aspirin EC 81 MG tablet Take 1 tablet (81 mg total) by mouth daily. Swallow whole. 90 tablet 3   Biotin 10 MG TABS Take by mouth daily at 6 (six) AM.     celecoxib (CELEBREX) 200 MG capsule Take 200 mg by mouth daily.     Cholecalciferol  (VITAMIN D3) 125 MCG (5000 UT) CAPS Take by mouth daily at 6 (six) AM.     isosorbide mononitrate (IMDUR) 30 MG 24 hr tablet Take 1 tablet (30 mg total) by mouth daily. 30 tablet 3   metoprolol succinate (TOPROL-XL) 25 MG 24 hr tablet Take 1 tablet (25 mg total) by mouth daily. Take with or immediately following a meal. 90 tablet 3   nitroGLYCERIN (NITROSTAT) 0.4 MG SL tablet Place 1 tablet (0.4 mg total) under the tongue every 5 (five) minutes as needed for chest pain. 90 tablet 3   TRUE METRIX BLOOD GLUCOSE TEST test strip 2 (two) times daily.     No current facility-administered medications on file prior to visit.    Cardiovascular and other pertinent studies:  CT Cardiac scoring 11/28/2020: LM: 0 LAD: 11 LCx: 10 RCA: 0 Total score: 21 (88th percentile)  Mobile cardiac telemetry 5 days 11/23/2020 - 11/28/2020: Dominant rhythm: Sinus. HR 60-135 bpm. Avg HR 91 bpm. 0 episodes of SVT 3.3% isolated SVE, 1.7% couplets, <1%triplets. 0 episodes of VT <% isolated VE, no couplet/triplets. No atrial fibrillation/atrial flutter/SVT/VT/high grade AV block, sinus pause >3sec noted. 0 patient triggered events.   Echocardiogram 11/23/2020:  Normal LV systolic function with visual EF 60-65%. Left ventricle cavity  is normal in size. Normal global wall motion. Normal diastolic filling  pattern, normal LAP.  Trace tricuspid regurgitation. No evidence of pulmonary hypertension.  No prior study for comparison.  Exercise Sestamibi stress test 11/21/2020: Exercise nuclear stress test was performed using Bruce protocol. Patient reached 7.3 METS, and 82% of age predicted maximum heart rate. Exercise capacity was low. No chest pain reported. Exertional dyspnea and dizziness reported. Heart rate and hemodynamic response were normal. Stress EKG revealed no ischemic changes. SPECT images showed small size, mild intensity, reversible perfusion defect in apical to basal, inferior myocardium.  Stress LVEF  62%. Low risk study.  11/2016 Acute CVA s/p TPA   MRI brain 11/2016:  Normal   CTA head/neck 11/2016: No carotid stenosis Small thrombus noted in Rt M2 segment, too small for safe retrieval   Echocardiogram 11/2016: Normal LV size. LVEF 55-65%. Mild LVH. Normal diastolic function.    Recent labs: 10/22/2020: Glucose 113, BUN/Cr 11/0.70. EGFR 98. Na/K 141/4.1. Rest of the CMP normal H/H 12.5/37.8 MCV 85.7. Platelets 246 HbA1C 7.0% Chol 247, TG 184, HDL 57, LDL 156 TSH 0.92 normal    Review of Systems  Constitutional: Positive for malaise/fatigue.  Cardiovascular:  Positive for chest pain. Negative for dyspnea on exertion, leg swelling, palpitations and syncope.        Vitals:   04/28/21 1307  BP: 126/71  Pulse: 89  Resp: 16  SpO2: 96%     Body mass index is 32.86 kg/m. Filed Weights   04/28/21 1307  Weight: 203 lb 9.6 oz (92.4 kg)     Objective:   Physical Exam Vitals and nursing note reviewed.  Constitutional:      General: She is not in acute distress. Neck:     Vascular: No JVD.  Cardiovascular:     Rate and Rhythm: Regular rhythm. Tachycardia present.     Pulses: Normal pulses.     Heart sounds: Normal heart sounds. No murmur heard. Pulmonary:     Effort: Pulmonary effort is normal.     Breath sounds: Normal breath sounds. No wheezing or rales.  Musculoskeletal:     Right lower leg: No edema.     Left lower leg: No edema.        Assessment & Recommendations:   55 y.o. African American female with hypertension, mixed hyperlipidemia, h/o stroke (2018), CAD with stable angina  CAD: Calcium score 21. Mild ischemia on stress testing (11/2020). Patient still has angina symptoms, although the duration of pain and the fatigue is not entirely explained by even obstructive CAD. I discussed coronary angiography as definitive evaluation and potential treatment for her symptoms, given that they are not controlled with current anti anginal therapy. Patient  would like to proceed.  Continue aspirin 81 mg daily, Praluent, Imdur 30 mg daily. Increase metoprolol succinate to 50 mg daily.  Hypertension: Controll  Mixed hyperlipidemia: Management as above   Nigel Mormon, MD Pager: [phone] Office: [phone]

## 2021-05-01 ENCOUNTER — Encounter: Payer: BC Managed Care – PPO | Admitting: Physical Therapy

## 2021-05-04 LAB — BASIC METABOLIC PANEL
BUN/Creatinine Ratio: 15 (ref 9–23)
BUN: 12 mg/dL (ref 6–24)
CO2: 22 mmol/L (ref 20–29)
Calcium: 9.9 mg/dL (ref 8.7–10.2)
Chloride: 101 mmol/L (ref 96–106)
Creatinine, Ser: 0.82 mg/dL (ref 0.57–1.00)
Glucose: 152 mg/dL — ABNORMAL HIGH (ref 70–99)
Potassium: 4.8 mmol/L (ref 3.5–5.2)
Sodium: 138 mmol/L (ref 134–144)
eGFR: 84 mL/min/{1.73_m2} (ref >59)

## 2021-05-04 LAB — CBC
Hematocrit: 37 % (ref 34.0–46.6)
Hemoglobin: 12.3 g/dL (ref 11.1–15.9)
MCH: 29.4 pg (ref 26.6–33.0)
MCHC: 33.2 g/dL (ref 31.5–35.7)
MCV: 88 fL (ref 79–97)
Platelets: 247 10*3/uL (ref 150–450)
RBC: 4.19 x10E6/uL (ref 3.77–5.28)
RDW: 13.2 % (ref 11.7–15.4)
WBC: 4.9 10*3/uL (ref 3.4–10.8)

## 2021-05-06 ENCOUNTER — Ambulatory Visit
Admission: RE | Admit: 2021-05-06 | Discharge: 2021-05-06 | Disposition: A | Discharge status: Home / Self Care | Payer: BC Managed Care – PPO | Source: Ambulatory Visit | Attending: Orthopedic Surgery | Admitting: Orthopedic Surgery

## 2021-05-06 ENCOUNTER — Other Ambulatory Visit: Payer: Self-pay

## 2021-05-06 DIAGNOSIS — M25531 Pain in right wrist: Secondary | ICD-10-CM

## 2021-05-06 IMAGING — MR MR WRIST*R* W/O CM
4 of 6 series · 26 of 40 positions shown · non-contrast
Comparison: X-ray [DATE], [DATE]

CLINICAL DATA: Radial sided right wrist pain. History of ganglion
cyst removal in [T7]

EXAM:
MR OF THE RIGHT WRIST WITHOUT CONTRAST
TECHNIQUE: Multiplanar, multisequence MR imaging of the right wrist was
performed. No intravenous contrast was administered.

[Series 9: T2 fat-sat · axial · 3.0mm · 0.25mm/px · z∈[-6,+76]mm · 8 of 25 slices shown (1 of 2)]
[im 1/25]
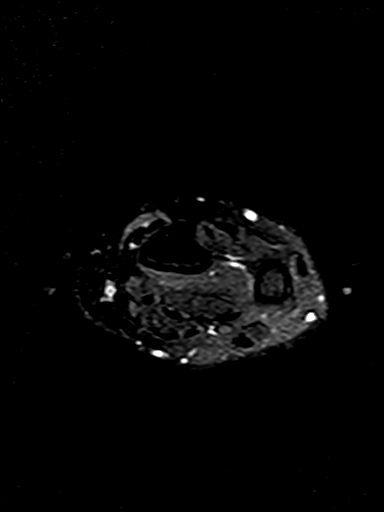
[im 4/25]
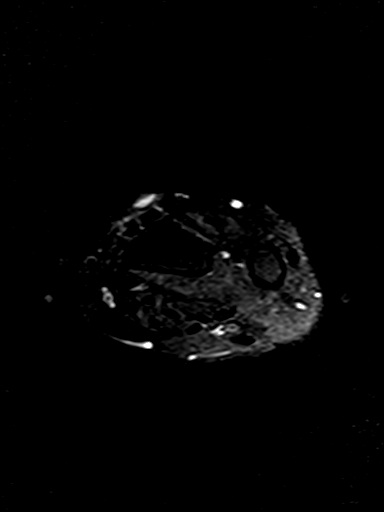
[im 7/25]
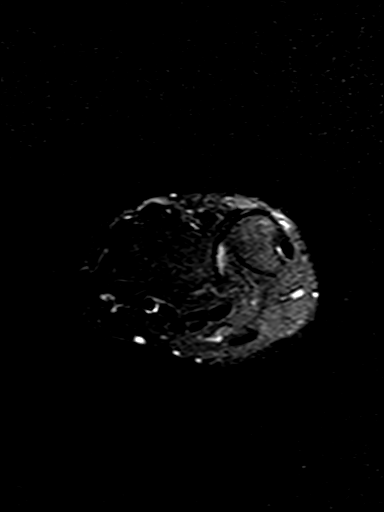
[im 11/25]
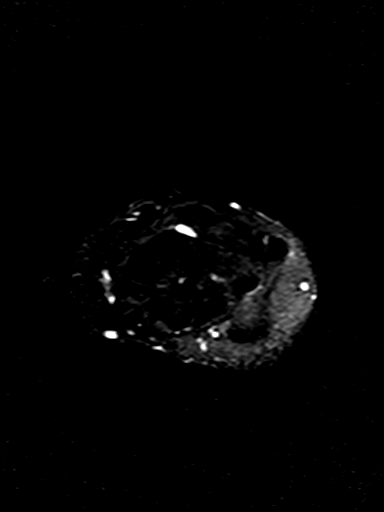
[im 14/25]
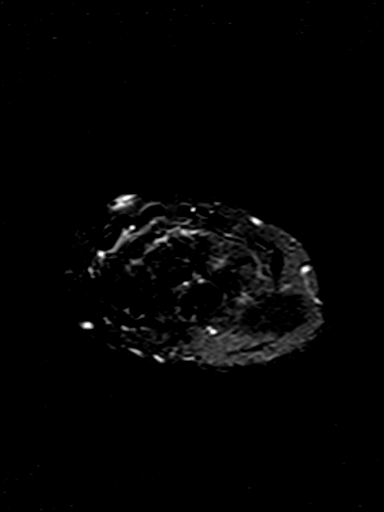
[im 18/25]
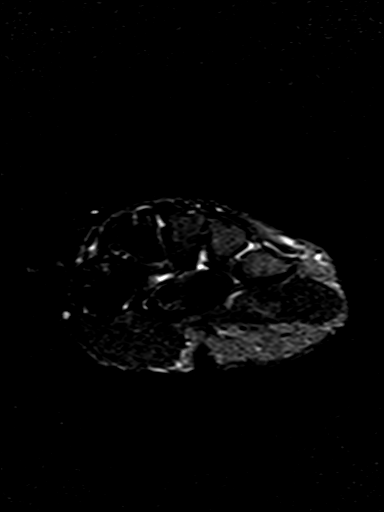
[im 21/25]
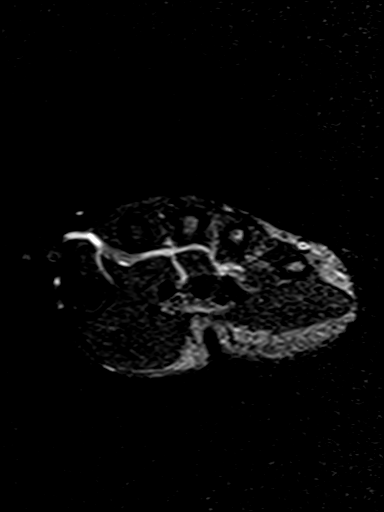
[im 25/25]
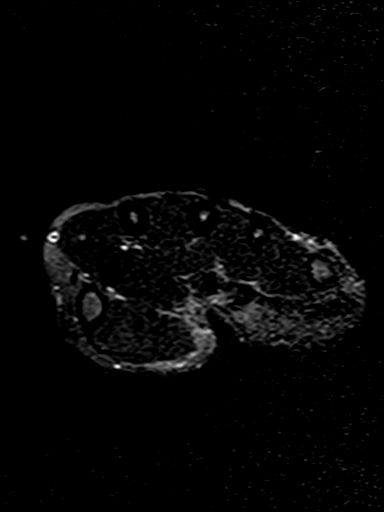

[Series 12: PD fat-sat · coronal · 3.0mm · 0.23mm/px · 6 of 21 slices shown (1 of 2)]
[im 1/21]
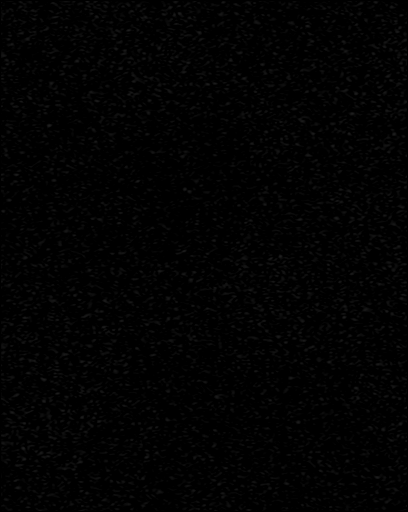
[im 5/21]
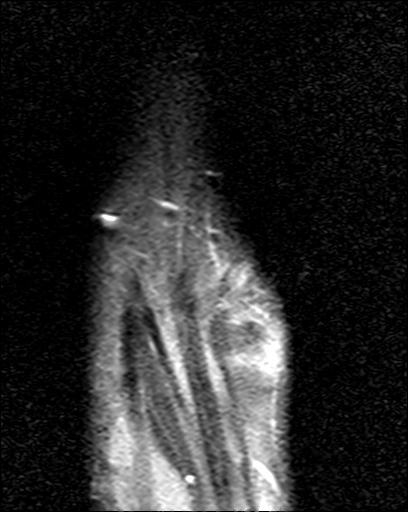
[im 9/21]
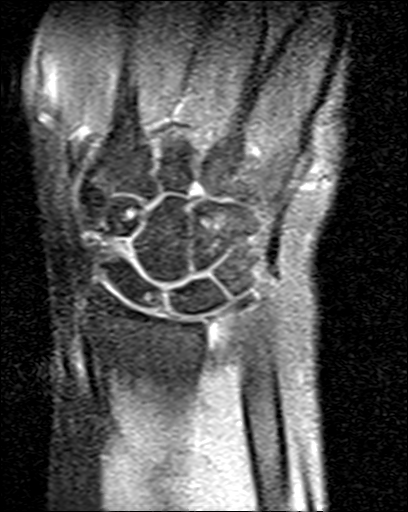
[im 13/21]
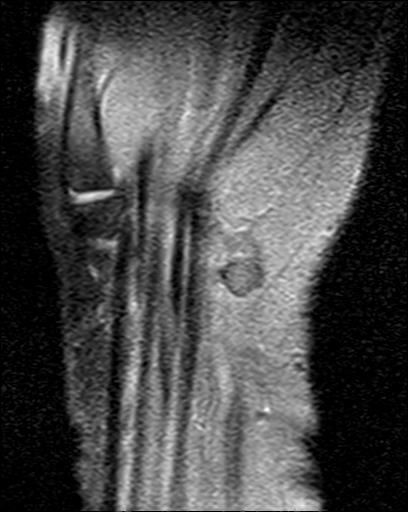
[im 17/21]
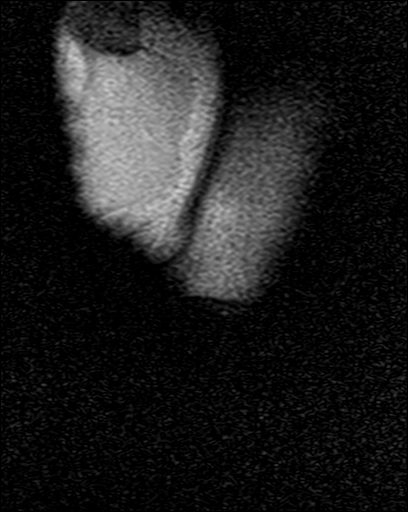
[im 21/21]
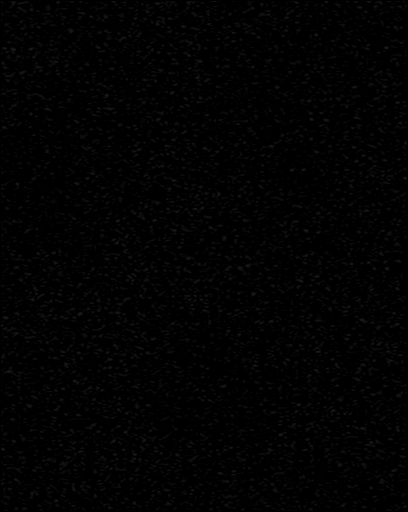

[Series 13: PD fat-sat · sagittal · 3.0mm · 0.23mm/px · 8 of 28 slices shown (2 of 2)]
[im 1/28]
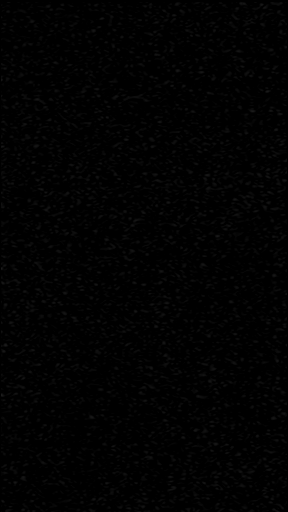
[im 4/28]
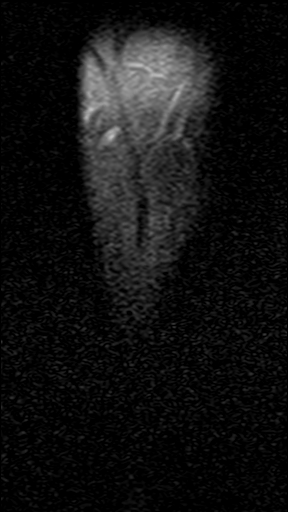
[im 8/28]
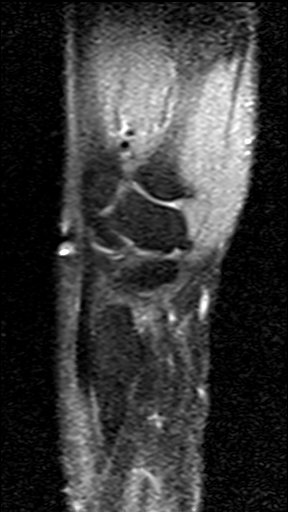
[im 12/28]
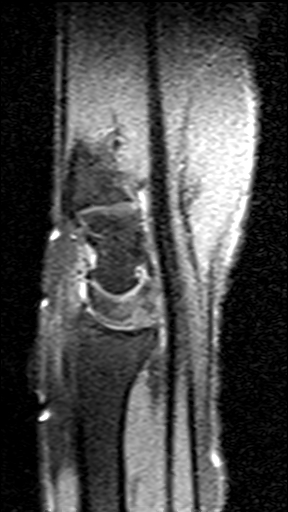
[im 16/28]
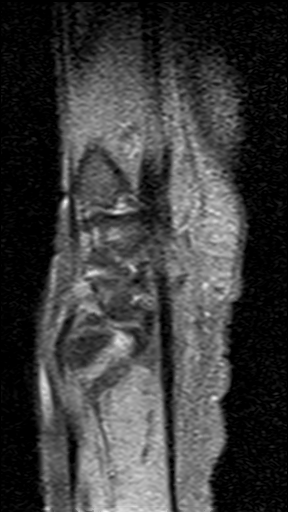
[im 20/28]
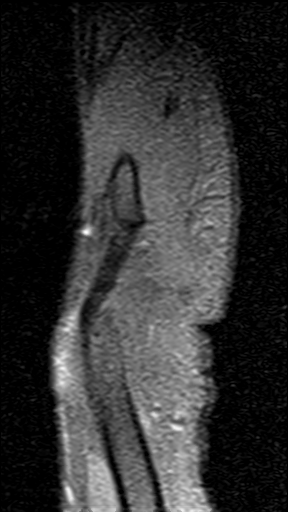
[im 24/28]
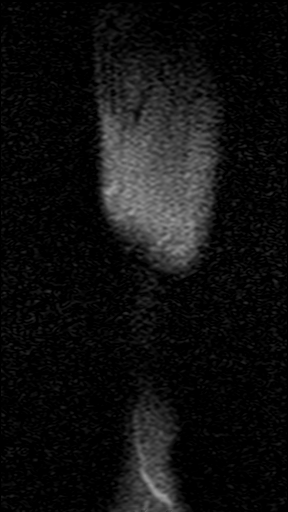
[im 28/28]
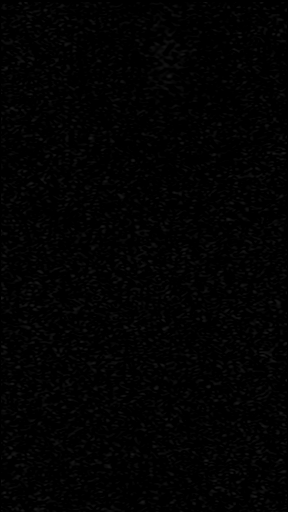

[Series 14: T2 fat-sat · coronal · 3.0mm · 0.23mm/px · 4 of 21 slices shown (2 of 2)]
[im 1/21]
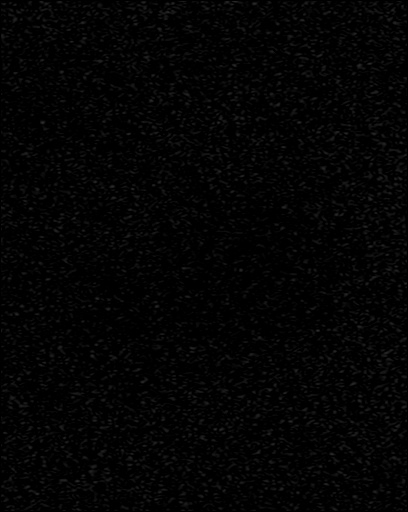
[im 5/21]
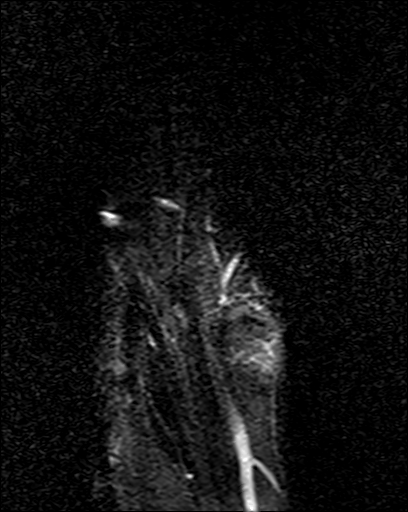
[im 13/21]
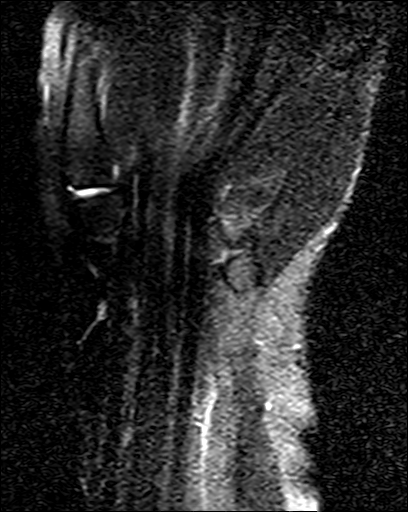
[im 21/21]
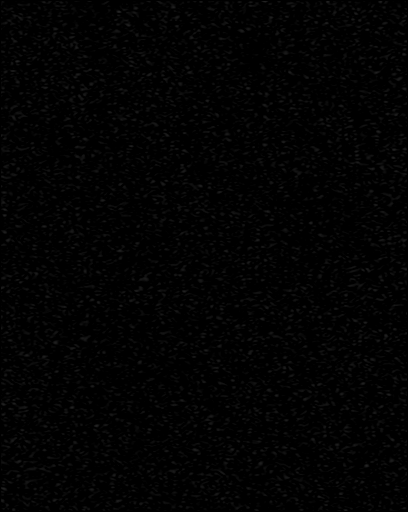

[26 of 40 positions shown; findings below may reference images not displayed]

FINDINGS: Technical Note: Despite efforts by the technologist and patient,
motion artifact is present on today's exam and could not be
eliminated. This reduces exam sensitivity and specificity.

Ligaments: Intact scapholunate and lunotriquetral ligaments.

Triangular fibrocartilage: Grossly intact on non-arthrographic
imaging.

Tendons: Flexor and extensor tendons appear grossly intact. Mild
tenosynovitis of the second and third extensor compartments located
at the level of the distal crossover.

Carpal tunnel/median nerve: Normal carpal tunnel. Normal median
nerve.

Guyon's canal: Normal.

Joint/cartilage: Mild arthropathy of the first CMC joint. Evaluation
of the articular cartilage is limited on motion degraded images. No
joint effusion. No definite erosion.

Bones/carpal alignment: No acute fracture. No dislocation. Scattered
small rounded T2 hyperintense foci within the carpal bones, likely
small subchondral cysts or intraosseous ganglia. No suspicious bone
lesion.

Other: No solid or cystic mass is seen about the wrist.
IMPRESSION: 1. Motion degraded images.
2. Mild tenosynovitis of the second and third extensor compartments
located at the level of the distal crossover, which may reflect
distal intersection syndrome in the appropriate clinical setting.
3. Mild arthropathy of the first CMC joint.

## 2021-05-09 ENCOUNTER — Encounter: Payer: Self-pay | Admitting: Orthopedic Surgery

## 2021-05-09 ENCOUNTER — Ambulatory Visit: Payer: BC Managed Care – PPO | Admitting: Orthopedic Surgery

## 2021-05-09 ENCOUNTER — Other Ambulatory Visit: Payer: Self-pay

## 2021-05-09 DIAGNOSIS — M25531 Pain in right wrist: Secondary | ICD-10-CM | POA: Diagnosis not present

## 2021-05-09 NOTE — Progress Notes (Signed)
Office Visit Note   Patient: Veronica Lynch           Date of Birth: Dec 19, 1965           MRN: [id number] Visit Date: 05/09/2021              Requested by: Dorothyann Peng, MD [address] [place],  [place] [address] PCP: Charlesetta Ivory, NP   Assessment & Plan: Visit Diagnoses:  1. Pain in right wrist     Plan: Reviewed the MRI with the patient.  The MRI was largely unremarkable.  She also has no pain today.  She has full and painless range of motion.  Her previous poorly localized pain has resolved.  Discussed that she can try oral or topical NSAIDs as needed when she has symptoms.  She can otherwise follow-up with me as needed.  Follow-Up Instructions: No follow-ups on file.   Orders:  No orders of the defined types were placed in this encounter.  No orders of the defined types were placed in this encounter.     Procedures: No procedures performed   Clinical Data: No additional findings.   Subjective: Chief Complaint  Patient presents with   Right Wrist - Follow-up    MRI Review    Is a 55 year old right-hand-dominant female who presents today for follow-up.  She was last seen on September 29 at which time she was describing diffuse and chronic right wrist pain.  She describes dull and sharp pain at the ulnar, dorsal, and radial aspects of her wrist.  Her pain is worse with getting up from a seated position or hyperextension type activities.  Today she has no pain.  Her previously described pain has resolved.  She says she has her good days and bad days and today is a particularly good day.   Review of Systems   Objective: Vital Signs: There were no vitals taken for this visit.  Physical Exam Constitutional:      Appearance: Normal appearance.  Cardiovascular:     Pulses: Normal pulses.  Pulmonary:     Effort: Pulmonary effort is normal.  Skin:    General: Skin is warm and dry.     Capillary Refill: Capillary refill takes less than 2  seconds.  Neurological:     Mental Status: She is alert.    Right Hand Exam   Tenderness  The patient is experiencing no tenderness.   Range of Motion  The patient has normal right wrist ROM.   Muscle Strength  The patient has normal right wrist strength.  Other  Erythema: absent Sensation: normal Pulse: present  Comments:  None of her previously painful areas are TTP today.      Specialty Comments:  No specialty comments available.  Imaging: No results found.   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   Coronary artery disease of native artery of native heart with stable angina pectoris ([facility]) 12/17/2020   Statin intolerance 12/17/2020   Essential hypertension 11/17/2020   Mixed hyperlipidemia 11/17/2020   Palpitations 11/17/2020   Exertional chest pain 11/17/2020   Exertional dyspnea 11/17/2020   Past Medical History:  Diagnosis Date   CVA (cerebral vascular accident) ([facility]) 2018   CVA (cerebral vascular accident) ([facility]) 2017   Diabetes ([facility])    Hyperlipidemia    Hypertension    Shingles  Family History  Problem Relation Age of Onset   Hypertension Mother    Hyperlipidemia Mother    Osteoporosis Mother    Cancer Father    Sarcoidosis Sister    Diabetes Maternal Aunt     Past Surgical History:  Procedure Laterality Date   CHOLECYSTECTOMY     CHOLECYSTECTOMY  1990   CYST REMOVAL HAND Right    wrist   CYST REMOVAL LEG     left knee   CYST REMOVAL LEG Left    knee   HYSTEROTOMY     LAPAROSCOPIC GASTRIC BANDING     LAPAROSCOPIC GASTRIC BANDING  2010   ROTATOR CUFF REPAIR Left    SHOULDER OPEN ROTATOR CUFF REPAIR     TOTAL ABDOMINAL HYSTERECTOMY     Social History   Occupational History   Not on file  Tobacco Use   Smoking status: Never   Smokeless tobacco: Never  Vaping Use   Vaping Use:  Never used  Substance and Sexual Activity   Alcohol use: Not Currently    Comment: occ wine   Drug use: Never   Sexual activity: Not on file

## 2021-05-15 DIAGNOSIS — R9439 Abnormal result of other cardiovascular function study: Secondary | ICD-10-CM

## 2021-05-16 ENCOUNTER — Emergency Department (HOSPITAL_COMMUNITY): Payer: BC Managed Care – PPO

## 2021-05-16 ENCOUNTER — Encounter (HOSPITAL_COMMUNITY)
Admission: RE | Disposition: A | Discharge status: Home / Self Care | Payer: Self-pay | Source: Home / Self Care | Attending: Cardiology

## 2021-05-16 ENCOUNTER — Other Ambulatory Visit: Payer: Self-pay

## 2021-05-16 ENCOUNTER — Observation Stay (HOSPITAL_COMMUNITY)
Admission: EM | Admit: 2021-05-16 | Discharge: 2021-05-17 | Disposition: A | Discharge status: Home / Self Care | Payer: BC Managed Care – PPO | Attending: Family Medicine | Admitting: Family Medicine

## 2021-05-16 ENCOUNTER — Other Ambulatory Visit (HOSPITAL_COMMUNITY): Payer: Self-pay

## 2021-05-16 ENCOUNTER — Ambulatory Visit (HOSPITAL_COMMUNITY)
Admission: RE | Admit: 2021-05-16 | Discharge: 2021-05-16 | Disposition: A | Discharge status: Home / Self Care | Payer: BC Managed Care – PPO | Source: Home / Self Care | Attending: Cardiology | Admitting: Cardiology

## 2021-05-16 ENCOUNTER — Encounter (HOSPITAL_COMMUNITY): Payer: Self-pay | Admitting: Emergency Medicine

## 2021-05-16 DIAGNOSIS — I2510 Atherosclerotic heart disease of native coronary artery without angina pectoris: Secondary | ICD-10-CM | POA: Insufficient documentation

## 2021-05-16 DIAGNOSIS — I10 Essential (primary) hypertension: Secondary | ICD-10-CM | POA: Diagnosis not present

## 2021-05-16 DIAGNOSIS — E119 Type 2 diabetes mellitus without complications: Secondary | ICD-10-CM | POA: Insufficient documentation

## 2021-05-16 DIAGNOSIS — R931 Abnormal findings on diagnostic imaging of heart and coronary circulation: Secondary | ICD-10-CM

## 2021-05-16 DIAGNOSIS — Z20822 Contact with and (suspected) exposure to covid-19: Secondary | ICD-10-CM | POA: Insufficient documentation

## 2021-05-16 DIAGNOSIS — Z79899 Other long term (current) drug therapy: Secondary | ICD-10-CM | POA: Diagnosis not present

## 2021-05-16 DIAGNOSIS — Z7982 Long term (current) use of aspirin: Secondary | ICD-10-CM | POA: Insufficient documentation

## 2021-05-16 DIAGNOSIS — R531 Weakness: Secondary | ICD-10-CM

## 2021-05-16 DIAGNOSIS — E782 Mixed hyperlipidemia: Secondary | ICD-10-CM | POA: Insufficient documentation

## 2021-05-16 DIAGNOSIS — R519 Headache, unspecified: Secondary | ICD-10-CM

## 2021-05-16 DIAGNOSIS — R9439 Abnormal result of other cardiovascular function study: Secondary | ICD-10-CM

## 2021-05-16 DIAGNOSIS — G43909 Migraine, unspecified, not intractable, without status migrainosus: Secondary | ICD-10-CM

## 2021-05-16 DIAGNOSIS — Z8673 Personal history of transient ischemic attack (TIA), and cerebral infarction without residual deficits: Secondary | ICD-10-CM | POA: Insufficient documentation

## 2021-05-16 DIAGNOSIS — Y900 Blood alcohol level of less than 20 mg/100 ml: Secondary | ICD-10-CM | POA: Insufficient documentation

## 2021-05-16 DIAGNOSIS — I25118 Atherosclerotic heart disease of native coronary artery with other forms of angina pectoris: Secondary | ICD-10-CM | POA: Diagnosis present

## 2021-05-16 DIAGNOSIS — Z789 Other specified health status: Secondary | ICD-10-CM | POA: Diagnosis present

## 2021-05-16 DIAGNOSIS — I639 Cerebral infarction, unspecified: Principal | ICD-10-CM | POA: Diagnosis present

## 2021-05-16 HISTORY — PX: LEFT HEART CATH AND CORONARY ANGIOGRAPHY: CATH118249

## 2021-05-16 HISTORY — PX: CORONARY STENT INTERVENTION: CATH118234

## 2021-05-16 LAB — I-STAT CHEM 8, ED
BUN: 11 mg/dL (ref 6–20)
Calcium, Ion: 1.09 mmol/L — ABNORMAL LOW (ref 1.15–1.40)
Chloride: 104 mmol/L (ref 98–111)
Creatinine, Ser: 0.8 mg/dL (ref 0.44–1.00)
Glucose, Bld: 142 mg/dL — ABNORMAL HIGH (ref 70–99)
HCT: 39 % (ref 36.0–46.0)
Hemoglobin: 13.3 g/dL (ref 12.0–15.0)
Potassium: 3.7 mmol/L (ref 3.5–5.1)
Sodium: 141 mmol/L (ref 135–145)
TCO2: 24 mmol/L (ref 22–32)

## 2021-05-16 LAB — COMPREHENSIVE METABOLIC PANEL WITH GFR
ALT: 36 U/L (ref 0–44)
AST: 29 U/L (ref 15–41)
Albumin: 4 g/dL (ref 3.5–5.0)
Alkaline Phosphatase: 68 U/L (ref 38–126)
Anion gap: 9 (ref 5–15)
BUN: 10 mg/dL (ref 6–20)
CO2: 25 mmol/L (ref 22–32)
Calcium: 9.1 mg/dL (ref 8.9–10.3)
Chloride: 104 mmol/L (ref 98–111)
Creatinine, Ser: 0.81 mg/dL (ref 0.44–1.00)
GFR, Estimated: >60 mL/min
Glucose, Bld: 149 mg/dL — ABNORMAL HIGH (ref 70–99)
Potassium: 3.7 mmol/L (ref 3.5–5.1)
Sodium: 138 mmol/L (ref 135–145)
Total Bilirubin: 0.3 mg/dL (ref 0.3–1.2)
Total Protein: 7.3 g/dL (ref 6.5–8.1)

## 2021-05-16 LAB — URINALYSIS, ROUTINE W REFLEX MICROSCOPIC
Bilirubin Urine: NEGATIVE
Glucose, UA: NEGATIVE mg/dL
Hgb urine dipstick: NEGATIVE
Ketones, ur: NEGATIVE mg/dL
Leukocytes,Ua: NEGATIVE
Nitrite: NEGATIVE
Protein, ur: NEGATIVE mg/dL
Specific Gravity, Urine: 1.029 (ref 1.005–1.030)
pH: 7 (ref 5.0–8.0)

## 2021-05-16 LAB — CBC
HCT: 37.8 % (ref 36.0–46.0)
Hemoglobin: 13.1 g/dL (ref 12.0–15.0)
MCH: 30.1 pg (ref 26.0–34.0)
MCHC: 34.7 g/dL (ref 30.0–36.0)
MCV: 86.9 fL (ref 80.0–100.0)
Platelets: 224 10*3/uL (ref 150–400)
RBC: 4.35 MIL/uL (ref 3.87–5.11)
RDW: 12.6 % (ref 11.5–15.5)
WBC: 5.7 10*3/uL (ref 4.0–10.5)
nRBC: 0 % (ref 0.0–0.2)

## 2021-05-16 LAB — PROTIME-INR
INR: 1 (ref 0.8–1.2)
Prothrombin Time: 13.3 s (ref 11.4–15.2)

## 2021-05-16 LAB — DIFFERENTIAL
Abs Immature Granulocytes: 0.01 10*3/uL (ref 0.00–0.07)
Basophils Absolute: 0 10*3/uL (ref 0.0–0.1)
Basophils Relative: 0 %
Eosinophils Absolute: 0.1 10*3/uL (ref 0.0–0.5)
Eosinophils Relative: 3 %
Immature Granulocytes: 0 %
Lymphocytes Relative: 26 %
Lymphs Abs: 1.5 10*3/uL (ref 0.7–4.0)
Monocytes Absolute: 0.5 10*3/uL (ref 0.1–1.0)
Monocytes Relative: 8 %
Neutro Abs: 3.5 10*3/uL (ref 1.7–7.7)
Neutrophils Relative %: 63 %

## 2021-05-16 LAB — RESP PANEL BY RT-PCR (FLU A&B, COVID) ARPGX2
Influenza A by PCR: NEGATIVE
Influenza B by PCR: NEGATIVE
SARS Coronavirus 2 by RT PCR: NEGATIVE

## 2021-05-16 LAB — RAPID URINE DRUG SCREEN, HOSP PERFORMED
Amphetamines: NOT DETECTED
Barbiturates: NOT DETECTED
Benzodiazepines: POSITIVE — AB
Cocaine: NOT DETECTED
Opiates: NOT DETECTED
Tetrahydrocannabinol: NOT DETECTED

## 2021-05-16 LAB — I-STAT BETA HCG BLOOD, ED (MC, WL, AP ONLY): I-stat hCG, quantitative: <5 m[IU]/mL (ref <5)

## 2021-05-16 LAB — CBG MONITORING, ED
Glucose-Capillary: 135 mg/dL — ABNORMAL HIGH (ref 70–99)
Glucose-Capillary: 150 mg/dL — ABNORMAL HIGH (ref 70–99)

## 2021-05-16 LAB — GLUCOSE, CAPILLARY: Glucose-Capillary: 163 mg/dL — ABNORMAL HIGH (ref 70–99)

## 2021-05-16 LAB — ETHANOL: Alcohol, Ethyl (B): <10 mg/dL

## 2021-05-16 LAB — APTT: aPTT: 26 s (ref 24–36)

## 2021-05-16 IMAGING — CT CT ANGIO HEAD-NECK (W OR W/O PERF)
3 of 7 series · 10 of 36 positions shown · IV contrast (omnipaque)
Comparison: None.

CLINICAL DATA: Stroke follow-up

EXAM:
CT ANGIOGRAPHY HEAD AND NECK
TECHNIQUE: Multidetector CT imaging of the head and neck was performed using
the standard protocol during bolus administration of intravenous
contrast. Multiplanar CT image reconstructions and MIPs were
obtained to evaluate the vascular anatomy. Carotid stenosis
measurements (when applicable) are obtained utilizing NASCET
criteria, using the distal internal carotid diameter as the
denominator.
CONTRAST:  60mL OMNIPAQUE IOHEXOL 350 MG/ML SOLN

[Series 5: cta neck · axial · 0.45mm/px · z∈[-171,-65]mm · 2 of 161 slices shown]
[im 54/161  soft-tissue]
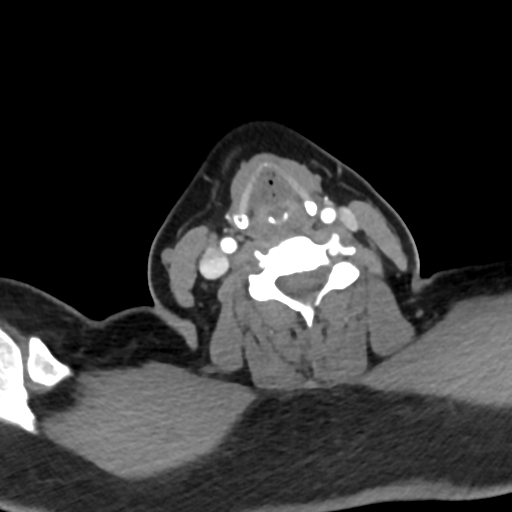
[im 107/161  soft-tissue]
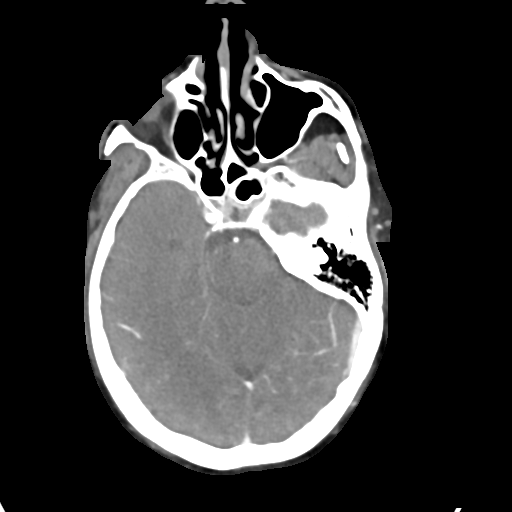

[Series 7: cta neck axial · axial · 0.39mm/px · z∈[-250,-29]mm · 6 of 319 slices shown]
[im 46/319  soft-tissue]
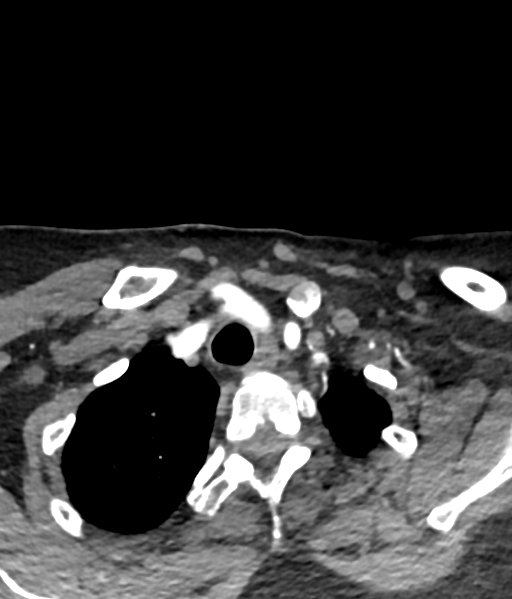
[im 91/319  bone]
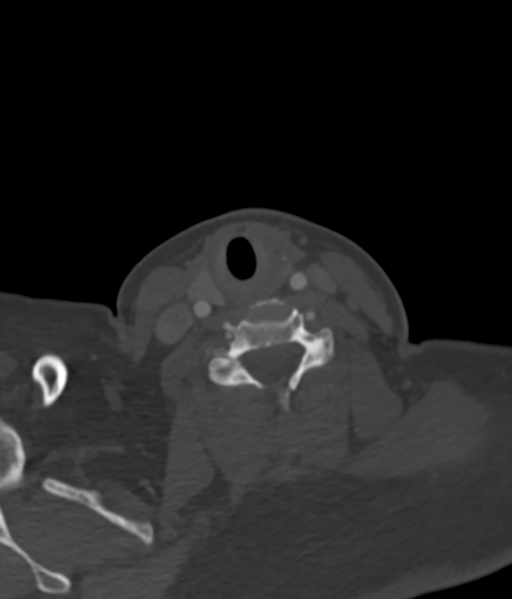
[im 137/319  soft-tissue]
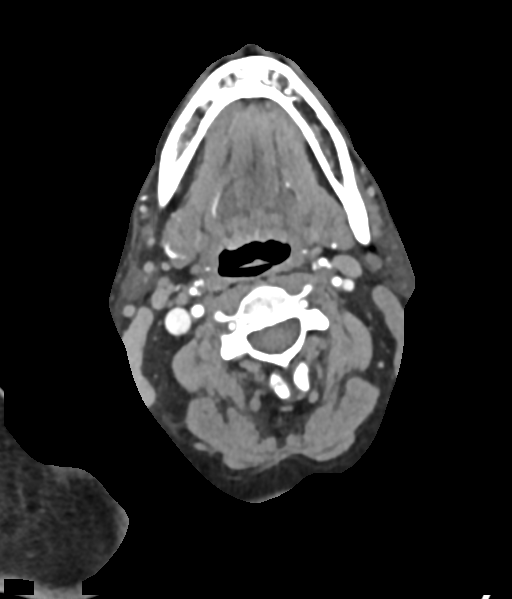
[im 182/319  bone]
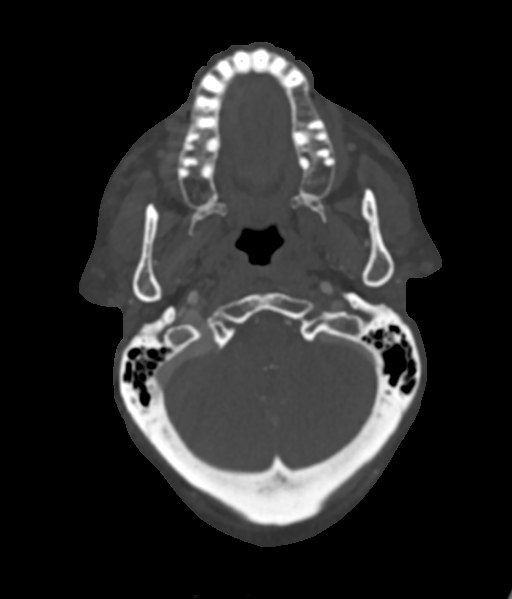
[im 228/319  soft-tissue]
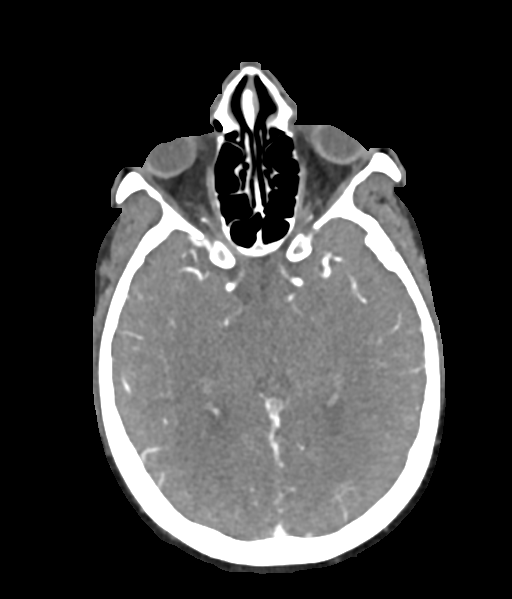
[im 273/319  bone]
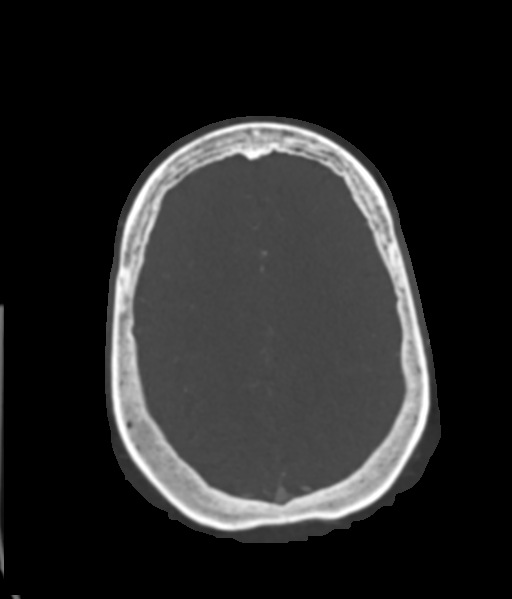

[Series 9: cta neck sagittal · sagittal · 0.47mm/px · 2 of 201 slices shown]
[im 82/201  soft-tissue]
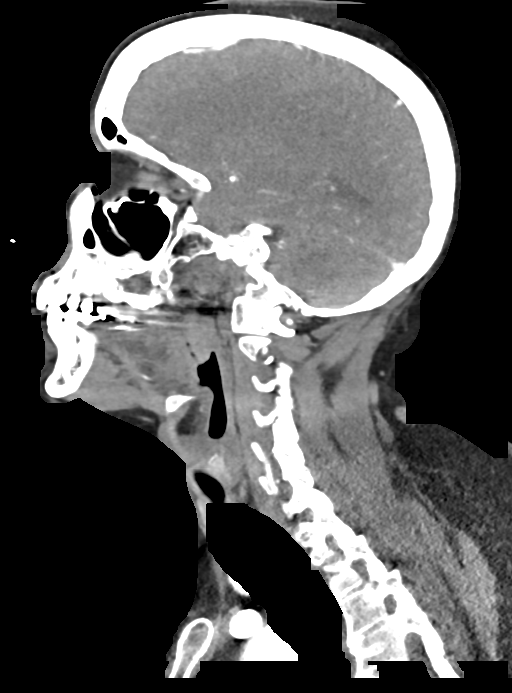
[im 120/201  soft-tissue]
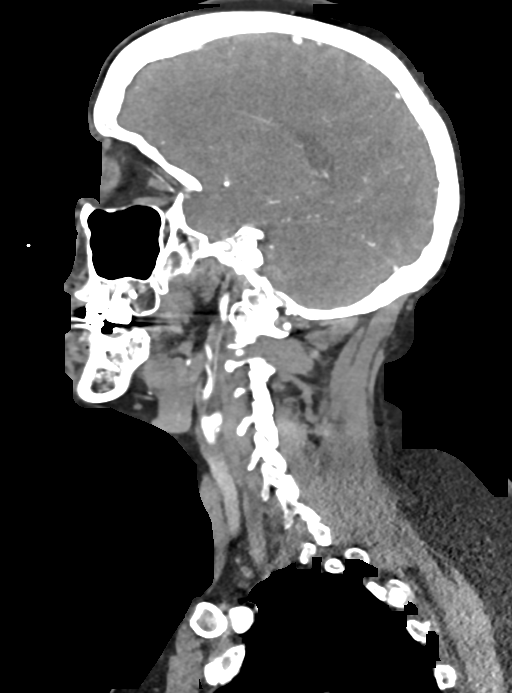

[10 of 36 positions shown; findings below may reference images not displayed]

FINDINGS: CTA NECK FINDINGS

SKELETON: There is no bony spinal canal stenosis. No lytic or
blastic lesion.

OTHER NECK: Normal pharynx, larynx and major salivary glands. No
cervical lymphadenopathy. Unremarkable thyroid gland.

UPPER CHEST: No pneumothorax or pleural effusion. No nodules or
masses.

AORTIC ARCH:

There is no calcific atherosclerosis of the aortic arch. There is no
aneurysm, dissection or hemodynamically significant stenosis of the
visualized portion of the aorta. Conventional 3 vessel aortic
branching pattern. The visualized proximal subclavian arteries are
widely patent.

RIGHT CAROTID SYSTEM: No dissection, occlusion or aneurysm. Mild
atherosclerotic calcification at the carotid bifurcation without
hemodynamically significant stenosis.

LEFT CAROTID SYSTEM: Normal without aneurysm, dissection or
stenosis.

VERTEBRAL ARTERIES: Left dominant configuration. Both origins are
clearly patent. There is no dissection, occlusion or flow-limiting
stenosis to the skull base (V1-V3 segments).

CTA HEAD FINDINGS

POSTERIOR CIRCULATION:

--Vertebral arteries: Normal V4 segments.

--Inferior cerebellar arteries: Normal.

--Basilar artery: Normal.

--Superior cerebellar arteries: Normal.

--Posterior cerebral arteries (PCA): Multifocal moderate-to-severe
narrowing of the right PCA with short segment occlusion of the P3
segment. Normal left PCA.

ANTERIOR CIRCULATION:

--Intracranial internal carotid arteries: Atherosclerotic
calcification of the internal carotid arteries at the skull base
without hemodynamically significant stenosis.

--Anterior cerebral arteries (ACA): Normal. Both A1 segments are
present. Patent anterior communicating artery (a-comm).

--Middle cerebral arteries (MCA): Normal.

VENOUS SINUSES: As permitted by contrast timing, patent.

ANATOMIC VARIANTS: None

Review of the MIP images confirms the above findings.
IMPRESSION: 1. No emergent large vessel occlusion.
2. Multifocal moderate-to-severe narrowing of the right posterior
cerebral artery with short segment occlusion of the P3 segment.
3. Mild right carotid bifurcation atherosclerosis without
hemodynamically significant stenosis by NASCET criteria.

## 2021-05-16 IMAGING — CT CT HEAD CODE STROKE
3 series · 15 of 47 positions shown, 18 images · non-contrast
Comparison: [DATE]

CLINICAL DATA: Code stroke.  Left facial droop and arm weakness

EXAM:
CT HEAD WITHOUT CONTRAST
TECHNIQUE: Contiguous axial images were obtained from the base of the skull
through the vertex without intravenous contrast.

[Series 2: head 5.0 st · axial · 0.40mm/px · z∈[-110,+20]mm · 9 of 32 slices shown, 12 images]
[im 3/32  brain]
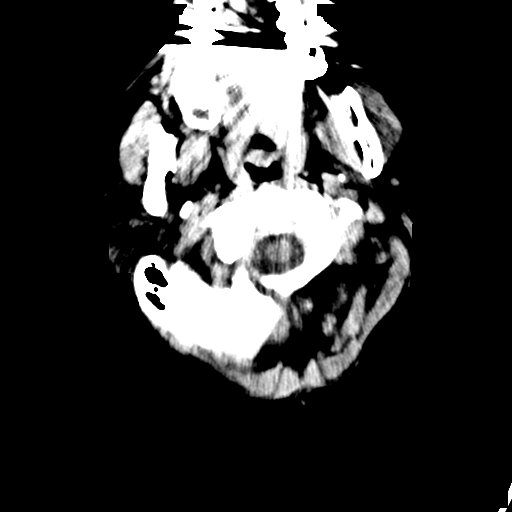
[im 3/32  bone]
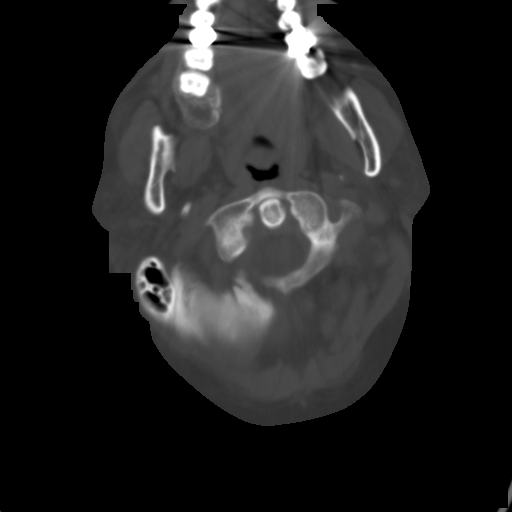
[im 6/32  brain]
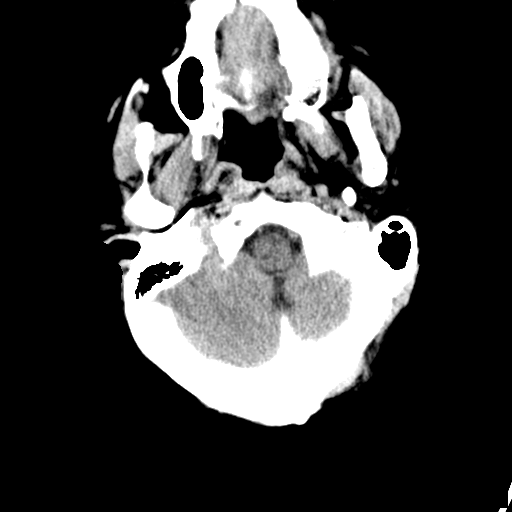
[im 9/32  brain]
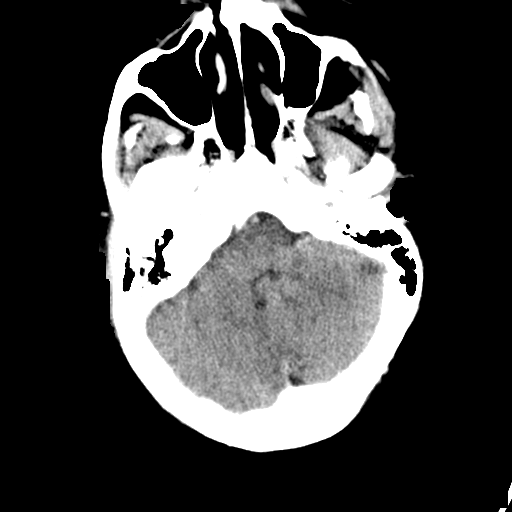
[im 12/32  brain]
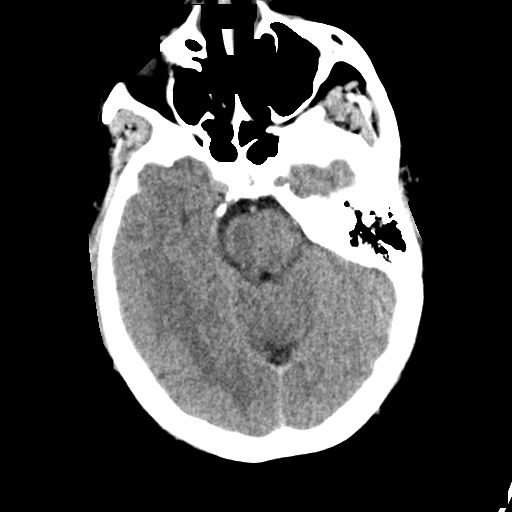
[im 17/32  brain]
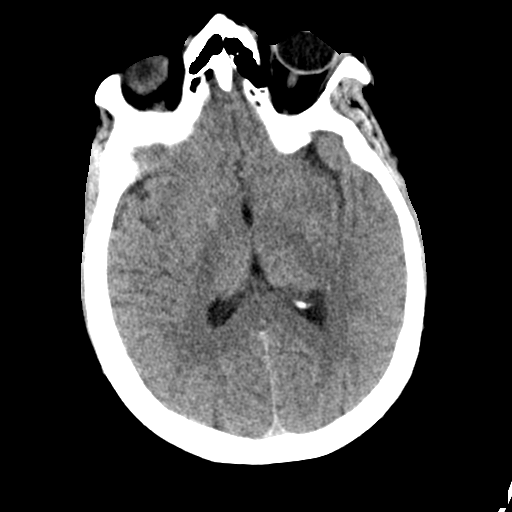
[im 17/32  bone]
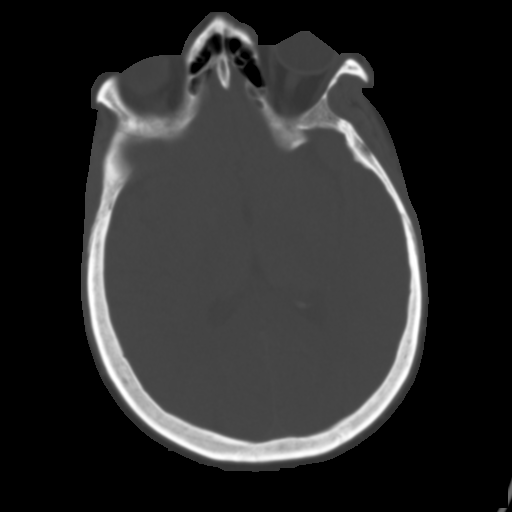
[im 20/32  brain]
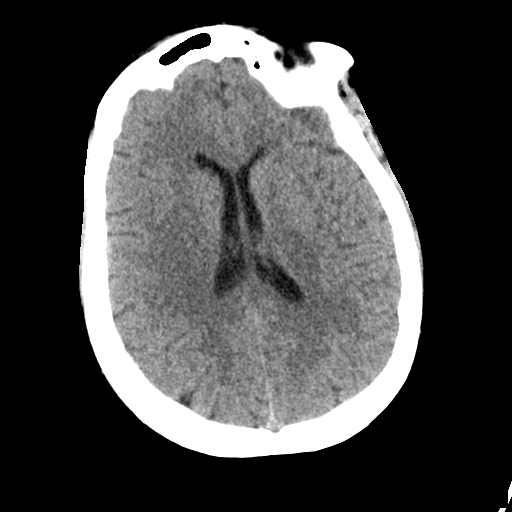
[im 23/32  brain]
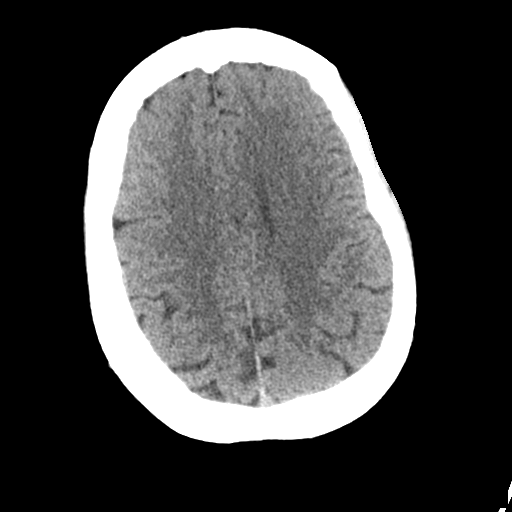
[im 26/32  brain]
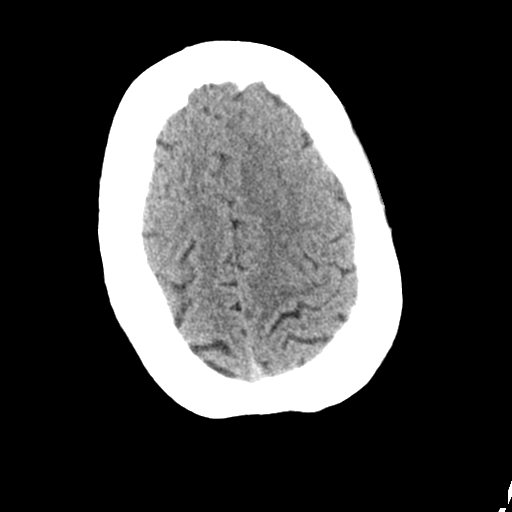
[im 29/32  brain]
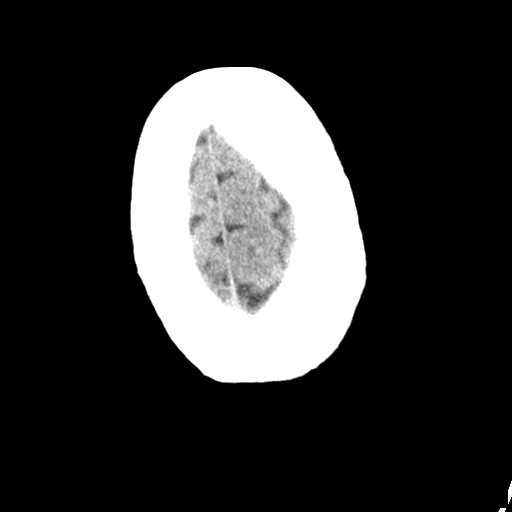
[im 29/32  bone]
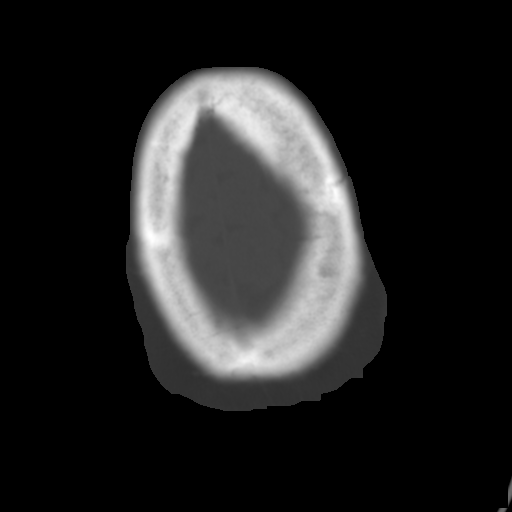

[Series 5: head 3.0 cor st · coronal · 0.31mm/px · 3 of 67 slices shown]
[im 23/67  brain]
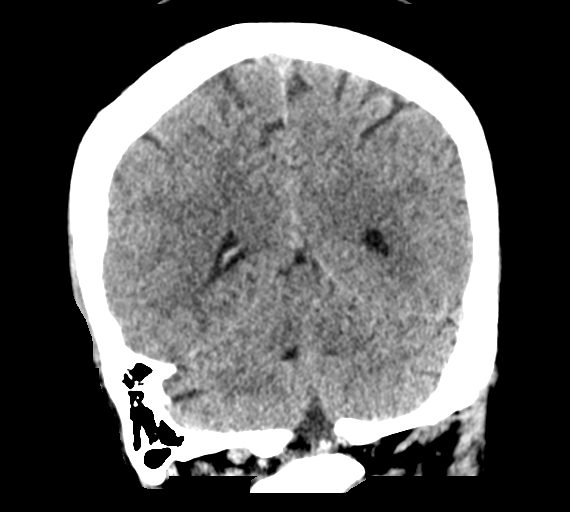
[im 30/67  brain]
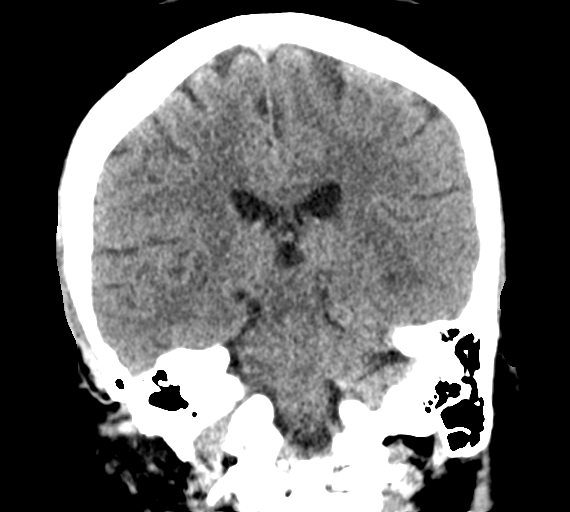
[im 37/67  brain]
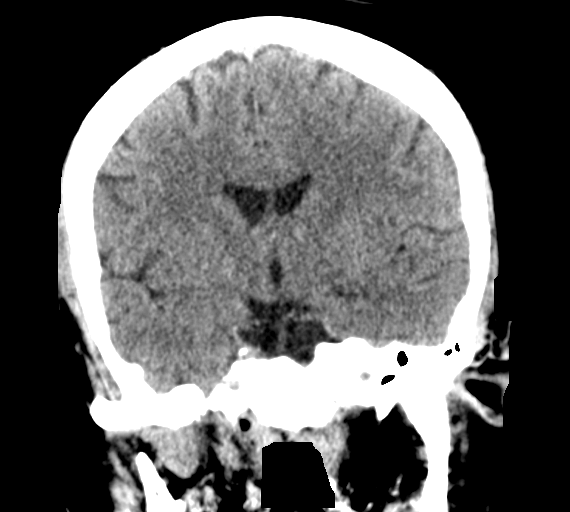

[Series 6: head 3.0 sag st · sagittal · 0.31mm/px · 3 of 60 slices shown]
[im 20/60  brain]
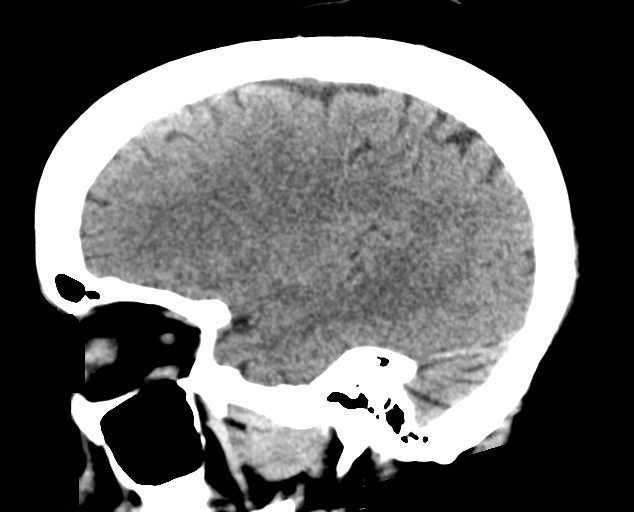
[im 30/60  brain]
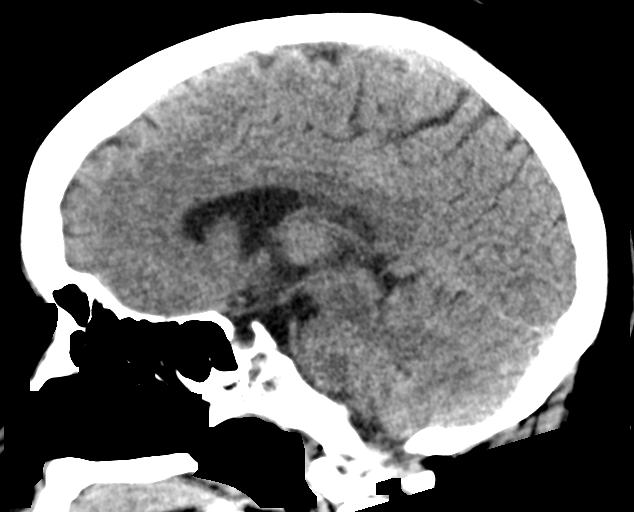
[im 40/60  brain]
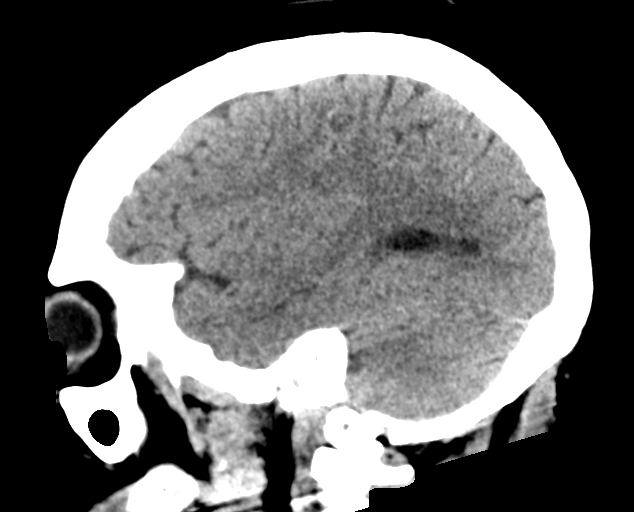

[15 of 47 positions shown; findings below may reference images not displayed]

FINDINGS: Brain: There is no mass, hemorrhage or extra-axial collection. The
size and configuration of the ventricles and extra-axial CSF spaces
are normal. The brain parenchyma is normal, without evidence of
acute or chronic infarction.

Vascular: No abnormal hyperdensity of the major intracranial
arteries or dural venous sinuses. No intracranial atherosclerosis.

Skull: The visualized skull base, calvarium and extracranial soft
tissues are normal.

Sinuses/Orbits: No fluid levels or advanced mucosal thickening of
the visualized paranasal sinuses. No mastoid or middle ear effusion.
The orbits are normal.

ASPECTS (Alberta Stroke Program Early CT Score)

- Ganglionic level infarction (caudate, lentiform nuclei, internal
capsule, insula, M1-M3 cortex): 7

- Supraganglionic infarction (M4-M6 cortex): 3

Total score (0-10 with 10 being normal): 10
IMPRESSION: 1. Normal head CT.
2. ASPECTS is 10.

These results were communicated to Dr. OG at [DATE] on [DATE] by text page via the AMION messaging system.

## 2021-05-16 IMAGING — MR MR HEAD W/O CM
9 series · 48 of 48 positions shown · non-contrast
Comparison: None.

CLINICAL DATA: Facial droop

EXAM:
MRI HEAD WITHOUT CONTRAST
TECHNIQUE: Multiplanar, multiecho pulse sequences of the brain and surrounding
structures were obtained without intravenous contrast.

[Series 5: DWI · axial · 3.0mm · 0.88mm/px · z∈[-102,+35]mm · 8 of 96 slices shown (1 of 4)]
[im 1/96]
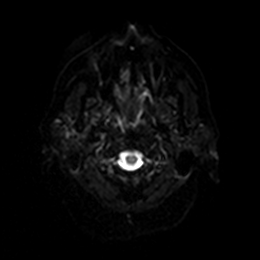
[im 14/96]
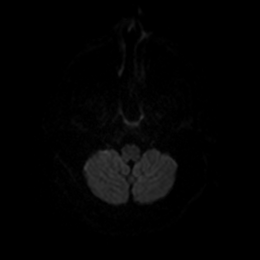
[im 28/96]
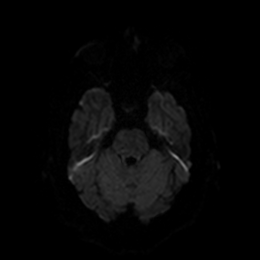
[im 41/96]
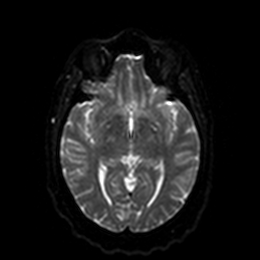
[im 55/96]
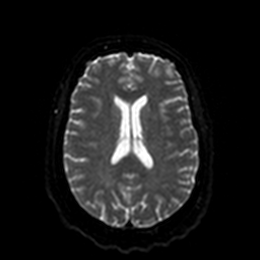
[im 68/96]
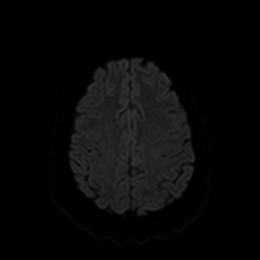
[im 82/96]
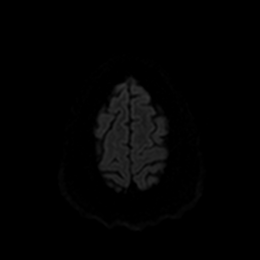
[im 96/96]
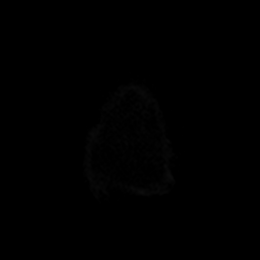

[Series 6: DWI · axial · 3.0mm · 0.88mm/px · z∈[-102,+35]mm · 5 of 48 slices shown (2 of 4)]
[im 1/48]
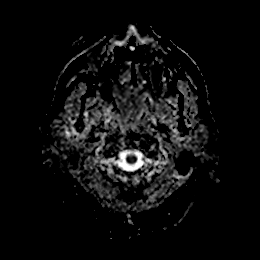
[im 12/48]
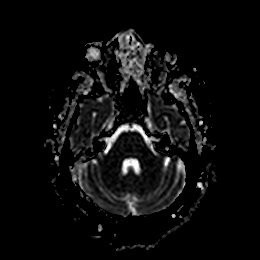
[im 24/48]
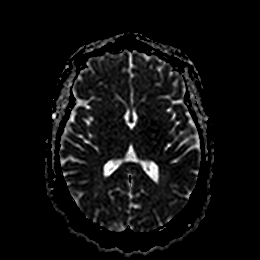
[im 36/48]
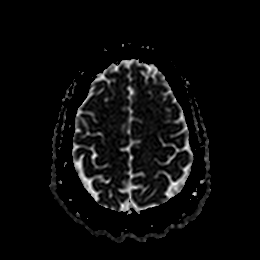
[im 48/48]
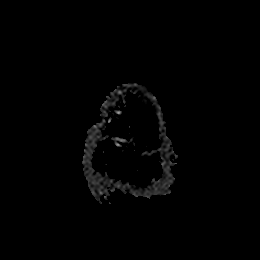

[Series 7: DWI · coronal · 4.0mm · 0.88mm/px · 7 of 68 slices shown (3 of 4)]
[im 1/68]
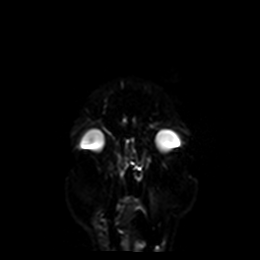
[im 12/68]
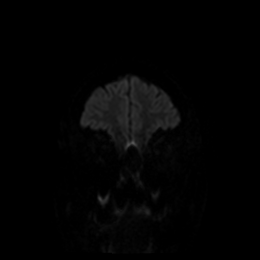
[im 23/68]
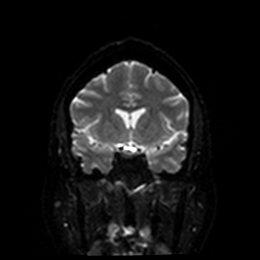
[im 34/68]
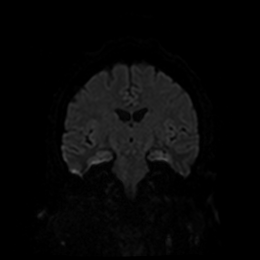
[im 45/68]
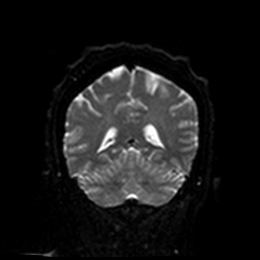
[im 56/68]
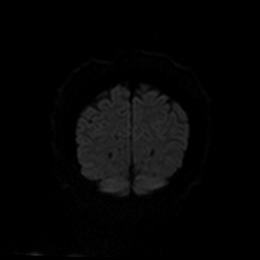
[im 68/68]
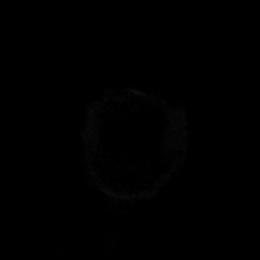

[Series 8: DWI · coronal · 4.0mm · 0.88mm/px · 3 of 34 slices shown (4 of 4)]
[im 1/34]
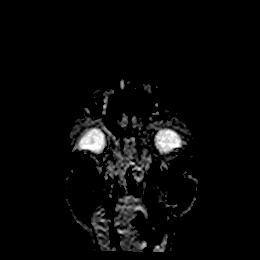
[im 17/34]
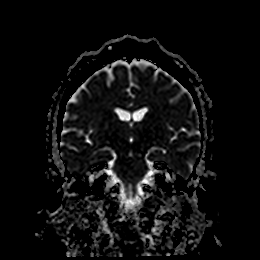
[im 34/34]
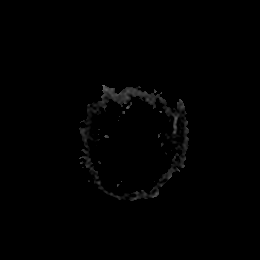

[Series 9: FLAIR · axial · 5.0mm · 0.45mm/px · z∈[-100,+41]mm · 2 of 25 slices shown]
[im 1/25]
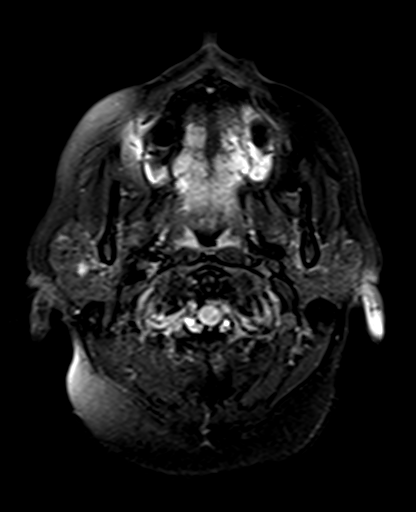
[im 25/25]
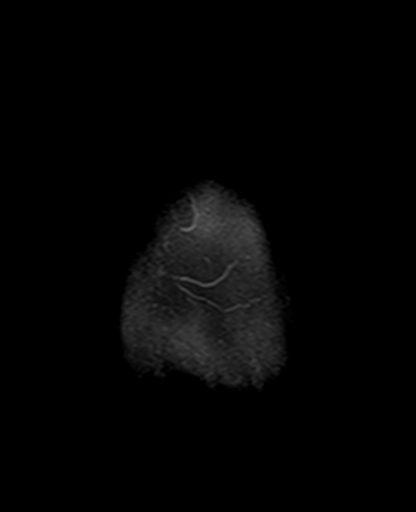

[Series 10: mag_images · axial · 3.0mm · 0.90mm/px · z∈[-110,+51]mm · 6 of 56 slices shown]
[im 1/56]
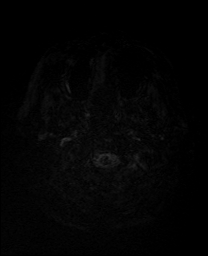
[im 12/56]
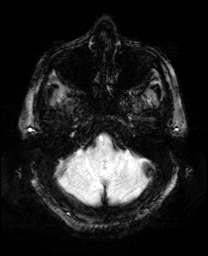
[im 23/56]
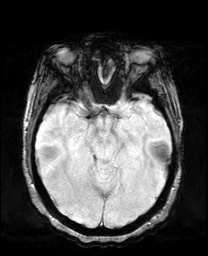
[im 34/56]
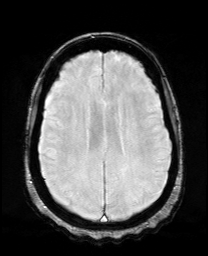
[im 45/56]
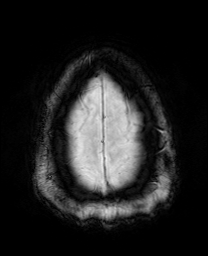
[im 56/56]
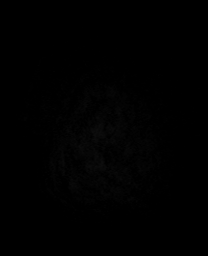

[Series 11: pha_images · axial · 3.0mm · 0.90mm/px · z∈[-110,+51]mm · 6 of 56 slices shown]
[im 1/56]
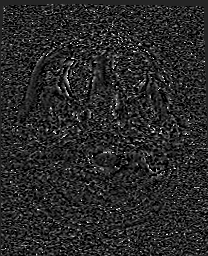
[im 12/56]
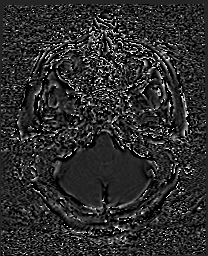
[im 23/56]
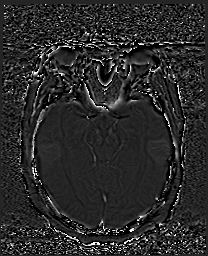
[im 34/56]
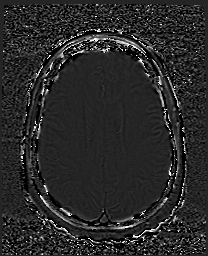
[im 45/56]
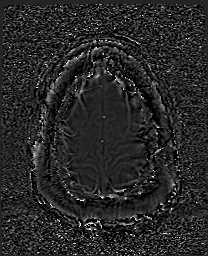
[im 56/56]
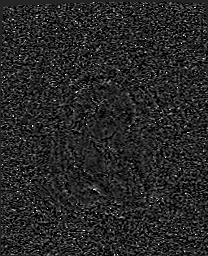

[Series 12: swi_images · axial · 3.0mm · 0.90mm/px · z∈[-110,+51]mm · 6 of 56 slices shown]
[im 1/56]
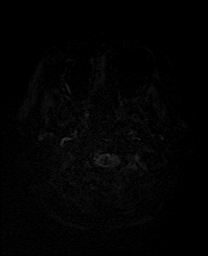
[im 12/56]
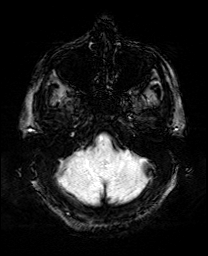
[im 23/56]
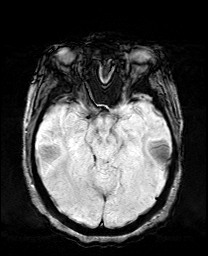
[im 34/56]
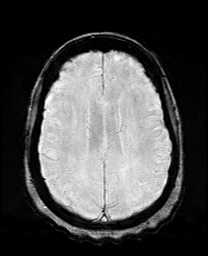
[im 45/56]
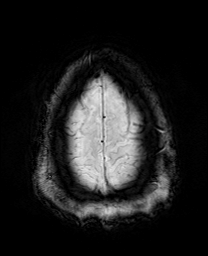
[im 56/56]
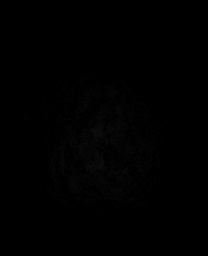

[Series 13: mip_images(sw) · axial · 24.0mm · 0.90mm/px · z∈[-100,+41]mm · 5 of 49 slices shown]
[im 1/49]
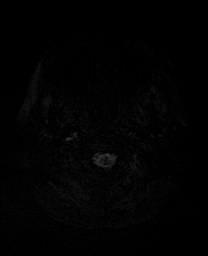
[im 13/49]
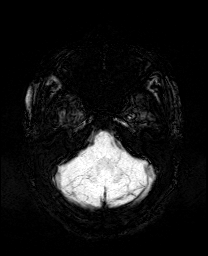
[im 25/49]
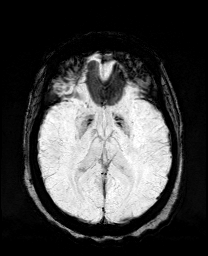
[im 37/49]
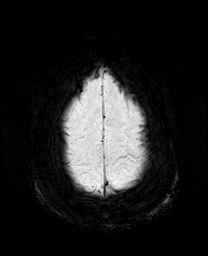
[im 49/49]
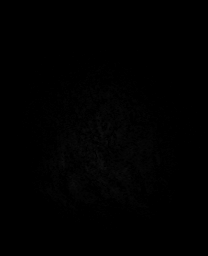

[48 of 48 positions shown; findings below may reference images not displayed]

FINDINGS: Brain: There is a punctate focus of diffusion restriction in the
region of the left caudate body, possibly artifactual. Otherwise, no
evidence of acute ischemia. No acute or chronic hemorrhage. Normal
white matter signal, parenchymal volume and CSF spaces. The midline
structures are normal.

Vascular: Major flow voids are preserved.

Skull and upper cervical spine: Normal calvarium and skull base.
Visualized upper cervical spine and soft tissues are normal.

Sinuses/Orbits:No paranasal sinus fluid levels or advanced mucosal
thickening. No mastoid or middle ear effusion. Normal orbits.
IMPRESSION: Punctate focus of diffusion restriction in the region of the left
caudate body, favored to be artifactual, particularly given that
this location would not be causative of the reported symptoms.
Otherwise normal brain.

## 2021-05-16 SURGERY — LEFT HEART CATH AND CORONARY ANGIOGRAPHY
Anesthesia: LOCAL

## 2021-05-16 MED ORDER — SODIUM CHLORIDE 0.9% FLUSH: 3.0000 mL | INTRAVENOUS | Status: DC | PRN

## 2021-05-16 MED ORDER — PROCHLORPERAZINE EDISYLATE 10 MG/2ML IJ SOLN
10.0000 mg | Freq: Once | INTRAMUSCULAR | Status: AC
  Administered 2021-05-16: 10 mg via INTRAVENOUS
  Filled 2021-05-16: qty 2

## 2021-05-16 MED ORDER — HEPARIN SODIUM (PORCINE) 1000 UNIT/ML IJ SOLN
INTRAMUSCULAR | Status: AC
  Filled 2021-05-16: qty 1

## 2021-05-16 MED ORDER — LIDOCAINE HCL (PF) 1 % IJ SOLN
INTRAMUSCULAR | Status: DC | PRN
  Administered 2021-05-16: 2 mL via SUBCUTANEOUS

## 2021-05-16 MED ORDER — ACETAMINOPHEN 325 MG PO TABS
650.0000 mg | ORAL_TABLET | ORAL | Status: DC | PRN
  Administered 2021-05-16: 650 mg via ORAL
  Filled 2021-05-16: qty 2

## 2021-05-16 MED ORDER — ASPIRIN 81 MG PO CHEW
81.0000 mg | CHEWABLE_TABLET | ORAL | Status: AC
  Administered 2021-05-16: 81 mg via ORAL
  Filled 2021-05-16: qty 1

## 2021-05-16 MED ORDER — MIDAZOLAM HCL 2 MG/2ML IJ SOLN
INTRAMUSCULAR | Status: DC | PRN
  Administered 2021-05-16: 1 mg via INTRAVENOUS

## 2021-05-16 MED ORDER — SODIUM CHLORIDE 0.9 % WEIGHT BASED INFUSION
3.0000 mL/kg/h | INTRAVENOUS | Status: AC
  Administered 2021-05-16: 3 mL/kg/h via INTRAVENOUS

## 2021-05-16 MED ORDER — HEPARIN (PORCINE) IN NACL 1000-0.9 UT/500ML-% IV SOLN
INTRAVENOUS | Status: DC | PRN
  Administered 2021-05-16 (×2): 500 mL

## 2021-05-16 MED ORDER — CLOPIDOGREL BISULFATE 75 MG PO TABS: 75.0000 mg | ORAL_TABLET | Freq: Every day | ORAL | Status: DC

## 2021-05-16 MED ORDER — VERAPAMIL HCL 2.5 MG/ML IV SOLN
INTRAVENOUS | Status: AC
  Filled 2021-05-16: qty 2

## 2021-05-16 MED ORDER — FENTANYL CITRATE (PF) 100 MCG/2ML IJ SOLN
INTRAMUSCULAR | Status: AC
  Filled 2021-05-16: qty 2

## 2021-05-16 MED ORDER — ONDANSETRON HCL 4 MG/2ML IJ SOLN
4.0000 mg | Freq: Once | INTRAMUSCULAR | Status: AC
  Administered 2021-05-16: 4 mg via INTRAVENOUS
  Filled 2021-05-16: qty 2

## 2021-05-16 MED ORDER — HEPARIN SODIUM (PORCINE) 1000 UNIT/ML IJ SOLN
INTRAMUSCULAR | Status: DC | PRN
  Administered 2021-05-16: 2000 [IU] via INTRAVENOUS
  Administered 2021-05-16: 5000 [IU] via INTRAVENOUS
  Administered 2021-05-16: 4000 [IU] via INTRAVENOUS

## 2021-05-16 MED ORDER — VERAPAMIL HCL 2.5 MG/ML IV SOLN
INTRAVENOUS | Status: DC | PRN
  Administered 2021-05-16: 10 mL via INTRA_ARTERIAL

## 2021-05-16 MED ORDER — DIPHENHYDRAMINE HCL 50 MG/ML IJ SOLN
12.5000 mg | Freq: Once | INTRAMUSCULAR | Status: AC
  Administered 2021-05-16: 12.5 mg via INTRAVENOUS
  Filled 2021-05-16: qty 1

## 2021-05-16 MED ORDER — HYDRALAZINE HCL 20 MG/ML IJ SOLN: 10.0000 mg | INTRAMUSCULAR | Status: DC | PRN

## 2021-05-16 MED ORDER — SODIUM CHLORIDE 0.9 % IV SOLN: 250.0000 mL | INTRAVENOUS | Status: DC | PRN

## 2021-05-16 MED ORDER — NITROGLYCERIN 1 MG/10 ML FOR IR/CATH LAB
INTRA_ARTERIAL | Status: AC
  Filled 2021-05-16: qty 10

## 2021-05-16 MED ORDER — SODIUM CHLORIDE 0.9 % IV SOLN: INTRAVENOUS | Status: AC

## 2021-05-16 MED ORDER — MIDAZOLAM HCL 2 MG/2ML IJ SOLN
INTRAMUSCULAR | Status: AC
  Filled 2021-05-16: qty 2

## 2021-05-16 MED ORDER — LABETALOL HCL 5 MG/ML IV SOLN: 10.0000 mg | INTRAVENOUS | Status: DC | PRN

## 2021-05-16 MED ORDER — SODIUM CHLORIDE 0.9% FLUSH: 3.0000 mL | Freq: Two times a day (BID) | INTRAVENOUS | Status: DC

## 2021-05-16 MED ORDER — LIDOCAINE HCL (PF) 1 % IJ SOLN
INTRAMUSCULAR | Status: AC
  Filled 2021-05-16: qty 30

## 2021-05-16 MED ORDER — HEPARIN (PORCINE) IN NACL 1000-0.9 UT/500ML-% IV SOLN
INTRAVENOUS | Status: AC
  Filled 2021-05-16: qty 1000

## 2021-05-16 MED ORDER — ONDANSETRON HCL 4 MG/2ML IJ SOLN
4.0000 mg | Freq: Four times a day (QID) | INTRAMUSCULAR | Status: DC | PRN
  Administered 2021-05-16: 4 mg via INTRAVENOUS
  Filled 2021-05-16: qty 2

## 2021-05-16 MED ORDER — IOHEXOL 350 MG/ML SOLN
60.0000 mL | Freq: Once | INTRAVENOUS | Status: AC | PRN
  Administered 2021-05-16: 60 mL via INTRAVENOUS

## 2021-05-16 MED ORDER — IOHEXOL 350 MG/ML SOLN
INTRAVENOUS | Status: DC | PRN
  Administered 2021-05-16: 125 mL via INTRA_ARTERIAL

## 2021-05-16 MED ORDER — CLOPIDOGREL BISULFATE 75 MG PO TABS
75.0000 mg | ORAL_TABLET | Freq: Every day | ORAL | 2 refills | Status: DC
  Filled 2021-05-16: qty 30, 30d supply, fill #0

## 2021-05-16 MED ORDER — CLOPIDOGREL BISULFATE 300 MG PO TABS
ORAL_TABLET | ORAL | Status: AC
  Filled 2021-05-16: qty 2

## 2021-05-16 MED ORDER — SODIUM CHLORIDE 0.9 % WEIGHT BASED INFUSION: 1.0000 mL/kg/h | INTRAVENOUS | Status: DC

## 2021-05-16 MED ORDER — CLOPIDOGREL BISULFATE 300 MG PO TABS
ORAL_TABLET | ORAL | Status: DC | PRN
  Administered 2021-05-16: 600 mg via ORAL

## 2021-05-16 MED ORDER — FENTANYL CITRATE (PF) 100 MCG/2ML IJ SOLN
INTRAMUSCULAR | Status: DC | PRN
  Administered 2021-05-16: 50 ug via INTRAVENOUS

## 2021-05-16 SURGICAL SUPPLY — 18 items
BALLN SAPPHIRE 2.0X12 (BALLOONS) ×2
BALLN SAPPHIRE ~~LOC~~ 2.25X12 (BALLOONS) ×2 IMPLANT
BALLOON SAPPHIRE 2.0X12 (BALLOONS) ×1 IMPLANT
CATH DRAGONFLY OPTIS 2.7FR (CATHETERS) ×2 IMPLANT
CATH LAUNCHER 6FR EBU 3 (CATHETERS) ×2 IMPLANT
CATH OPTITORQUE TIG 4.0 5F (CATHETERS) ×2 IMPLANT
DEVICE RAD COMP TR BAND LRG (VASCULAR PRODUCTS) ×2 IMPLANT
GLIDESHEATH SLEND A-KIT 6F 22G (SHEATH) ×2 IMPLANT
GUIDEWIRE INQWIRE 1.5J.035X260 (WIRE) ×1 IMPLANT
INQWIRE 1.5J .035X260CM (WIRE) ×2
KIT HEART LEFT (KITS) ×2 IMPLANT
KIT HEMO VALVE WATCHDOG (MISCELLANEOUS) ×2 IMPLANT
PACK CARDIAC CATHETERIZATION (CUSTOM PROCEDURE TRAY) ×2 IMPLANT
STENT ONYX FRONTIER 2.0X15 (Permanent Stent) ×2 IMPLANT
SYR MEDRAD MARK 7 150ML (SYRINGE) ×2 IMPLANT
TRANSDUCER W/STOPCOCK (MISCELLANEOUS) ×2 IMPLANT
TUBING CIL FLEX 10 FLL-RA (TUBING) ×2 IMPLANT
WIRE ASAHI PROWATER 180CM (WIRE) ×2 IMPLANT

## 2021-05-16 NOTE — Progress Notes (Signed)
Sherelle from cath lab came to assess patient's TR band after hematomas x 3 after holding pressure for 10-15 minutes each time. She stated she would return if hematoma returned again. Hematoma returned for 4th time and Sherelle adjusted TR band and reinflated 10cc in TR band. Sherelle stated she would place coban above TR band and to take off the coban in 20 minutes to deflate 2cc of air from band and to resume deflating as usual. Will continue to monitor.

## 2021-05-16 NOTE — Consult Note (Signed)
Neurology Consultation Reason for Consult: Left-sided weakness Referring Physician: Harvel Ricks  CC: Left-sided weakness  History is obtained from: Patient, daughter  HPI: Veronica Lynch is a 55 y.o. female with a history of CVA, diabetes, hyperlipidemia, hypertension who presents with left-sided weakness and severe left-sided headache.  She was activated as a code stroke, and on my evaluation I noted some inconsistent left-sided weakness and stuttering.  Due to these inconsistent findings as well as the fact that she had just recently had problems with hematoma around her cath site, I favored getting an MRI prior to consideration of any type of lytics.  She was taken for emergent MRI which demonstrates a single possible punctate focus of ischemia but nothing that would cause her symptoms.   LKW: 6:15 PM tpa given?: no, not a stroke   ROS: A 14 point ROS was performed and is negative except as noted in the HPI.  Past Medical History:  Diagnosis Date   CVA (cerebral vascular accident) (HCC) 2018   CVA (cerebral vascular accident) (HCC) 2017   Diabetes (HCC)    Hyperlipidemia    Hypertension    Shingles      Family History  Problem Relation Age of Onset   Hypertension Mother    Hyperlipidemia Mother    Osteoporosis Mother    Cancer Father    Sarcoidosis Sister    Diabetes Maternal Aunt      Social History:  reports that she has never smoked. She has never used smokeless tobacco. She reports that she does not currently use alcohol. She reports that she does not use drugs.   Exam: Current vital signs: BP (!) 149/72 (BP Location: Left Arm)   Pulse 75   Temp 98.9 F (37.2 C) (Oral)   Resp 15   SpO2 100%  Vital signs in last 24 hours: Temp:  [98.2 F (36.8 C)-98.9 F (37.2 C)] 98.9 F (37.2 C) (11/01 1946) Pulse Rate:  [25-94] 75 (11/01 1946) Resp:  [10-17] 15 (11/01 1946) BP: (114-167)/(12-94) 149/72 (11/01 1946) SpO2:  [89 %-100 %] 100 % (11/01 1946) Weight:   [88.5 kg] 88.5 kg (11/01 0720)   Physical Exam  Constitutional: Appears well-developed and well-nourished.  Psych: Affect appropriate to situation Eyes: No scleral injection HENT: No OP obstruction MSK: no joint deformities.  Cardiovascular: Normal rate and regular rhythm.  Respiratory: Effort normal, non-labored breathing GI: Soft.  No distension. There is no tenderness.  Skin: WDI  Neuro: Mental Status: Patient is awake, alert, oriented to person, place, month, year, and situation.  She has mild stuttering at times. Patient is able to give a clear and coherent history. No signs of aphasia or neglect Cranial Nerves: II: Visual Fields are full. Pupils are equal, round, and reactive to light.   III,IV, VI: EOMI without ptosis or diploplia.  V: Facial sensation is symmetric to temperature VII: Facial movement with >? Mild L NL fold flattening, but no air leak.  VIII: hearing is intact to voice X: Uvula elevates symmetrically XI: Shoulder shrug is symmetric. XII: tongue is midline without atrophy or fasciculations.  Motor: Tone is normal. Bulk is normal. 5/5 strength was present on the right, she has 4/5 weakness of the left arm with no drift, mild drift wihtout confrontational weakness in the left leg.  Sensory: Sensation is symmetric to light touch and temperature in the arms and legs. Cerebellar: No clear ataxia      I have reviewed labs in epic and the results pertinent to  this consultation are: Cr 0.8  I have reviewed the images obtained:CT/CTA - negative  Impression: 55 yo F with left-sided weakness which is inconsistent on exam, coupled with a severe left sided headache.  I suspect that this represents complicated migraine, but given that she does have a potential punctate focus of ischemia, not unreasonable to observe overnight.  If it is a punctate focus of ischemia, likely catheter related infarct.  I would not change antithrombotic therapy based on this  event.  Recommendations: 1) lipids, A1c 2) continue aspirin and Plavix 3) stroke team to follow  Ritta Slot, MD [facility] [phone]  If 7pm- 7am, please page neurology on call as listed in AMION.

## 2021-05-16 NOTE — ED Triage Notes (Addendum)
Pt had stent placed this morning.  At about 6:15pm she started complaining of a bad headache and neck pain.  Daughter stated she noticed a facial droop on the left side and weakness.  Pt took a plavix before she left the hospital today.  Droop has since resolved however weakness in Left arm and leg continue.    Code stroke called from triage.

## 2021-05-16 NOTE — Progress Notes (Signed)
CARDIAC REHAB PHASE I   Stent education completed with pt. Pt educated on importance of ASA and Plavix. Pt given stent card and heart healthy diet. Reviewed site care, restrictions, and exercise guidelines. Will refer to CRP II [place]. Pt denies further questions or concerns at this time.  [id number] Reynold Bowen, RN BSN 05/16/2021 11:30 AM

## 2021-05-16 NOTE — H&P (Signed)
OV 04/28/2021 copied for documentation     Patient referred by Glendale Chard, MD for hypertension  Subjective:   Veronica Lynch, female    DOB: Aug 05, 1965, 55 y.o.   MRN: [id number]   Chief Complaint  Patient presents with   Chest Pain   Follow-up     55 y.o. African American female with hypertension, mixed hyperlipidemia, h/o stroke (2018), CAD with stable angina  Patient continues to have chest pain and fatigue with activity. Pain has not improved on current anti anginal therapy. Sometimes, pain improves with NTG, but can last up to 1 hour. She has   Initial consultation HPI 11/2020: Patient works as an Web designer at [employer].  She moved from [place] to [place].  Prior to that, she lived in [place].  In 2017, patient had a stroke, fortunately with no significant residual deficit, following a lap band procedure. She was admitted at [facility] and then transferred to [facility] of the [facility] at [facility]  More recently, she is experienced retrosternal chest pain and shortness of breath with walking.  She used to walk up to 10 miles a week, but this is down significantly in the last few months due to the above symptoms.  She denies any presyncope syncope, orthopnea, PND.  She has occasional leg edema.  Her sister has noticed that patient's heart rate has been irregular.  Patient had an EKG performed at her PCP office, copy of which is not legible.  I will request another copy.     Current Outpatient Medications on File Prior to Visit  Medication Sig Dispense Refill   Alirocumab (PRALUENT) 75 MG/ML SOAJ Inject 1 mL into the skin every 14 (fourteen) days. 2 mL 3   Ascorbic Acid (VITAMIN C) 1000 MG tablet Take 1,000 mg by mouth daily.     aspirin EC 81 MG tablet Take 1 tablet (81 mg total) by mouth daily. Swallow whole. 90 tablet 3   Biotin 10 MG TABS Take by mouth daily at 6 (six) AM.     celecoxib (CELEBREX) 200 MG capsule Take 200 mg  by mouth daily.     Cholecalciferol (VITAMIN D3) 125 MCG (5000 UT) CAPS Take by mouth daily at 6 (six) AM.     isosorbide mononitrate (IMDUR) 30 MG 24 hr tablet Take 1 tablet (30 mg total) by mouth daily. 30 tablet 3   metoprolol succinate (TOPROL-XL) 25 MG 24 hr tablet Take 1 tablet (25 mg total) by mouth daily. Take with or immediately following a meal. 90 tablet 3   nitroGLYCERIN (NITROSTAT) 0.4 MG SL tablet Place 1 tablet (0.4 mg total) under the tongue every 5 (five) minutes as needed for chest pain. 90 tablet 3   TRUE METRIX BLOOD GLUCOSE TEST test strip 2 (two) times daily.     No current facility-administered medications on file prior to visit.    Cardiovascular and other pertinent studies:  CT Cardiac scoring 11/28/2020: LM: 0 LAD: 11 LCx: 10 RCA: 0 Total score: 21 (88th percentile)  Mobile cardiac telemetry 5 days 11/23/2020 - 11/28/2020: Dominant rhythm: Sinus. HR 60-135 bpm. Avg HR 91 bpm. 0 episodes of SVT 3.3% isolated SVE, 1.7% couplets, <1%triplets. 0 episodes of VT <% isolated VE, no couplet/triplets. No atrial fibrillation/atrial flutter/SVT/VT/high grade AV block, sinus pause >3sec noted. 0 patient triggered events.   Echocardiogram 11/23/2020:  Normal LV systolic function with visual EF 60-65%. Left ventricle cavity  is normal in size. Normal global wall motion. Normal  diastolic filling  pattern, normal LAP.  Trace tricuspid regurgitation. No evidence of pulmonary hypertension.  No prior study for comparison.  Exercise Sestamibi stress test 11/21/2020: Exercise nuclear stress test was performed using Bruce protocol. Patient reached 7.3 METS, and 82% of age predicted maximum heart rate. Exercise capacity was low. No chest pain reported. Exertional dyspnea and dizziness reported. Heart rate and hemodynamic response were normal. Stress EKG revealed no ischemic changes. SPECT images showed small size, mild intensity, reversible perfusion defect in apical to basal,  inferior myocardium.  Stress LVEF 62%. Low risk study.  11/2016 Acute CVA s/p TPA   MRI brain 11/2016:  Normal   CTA head/neck 11/2016: No carotid stenosis Small thrombus noted in Rt M2 segment, too small for safe retrieval   Echocardiogram 11/2016: Normal LV size. LVEF 55-65%. Mild LVH. Normal diastolic function.    Recent labs: 10/22/2020: Glucose 113, BUN/Cr 11/0.70. EGFR 98. Na/K 141/4.1. Rest of the CMP normal H/H 12.5/37.8 MCV 85.7. Platelets 246 HbA1C 7.0% Chol 247, TG 184, HDL 57, LDL 156 TSH 0.92 normal    Review of Systems  Constitutional: Positive for malaise/fatigue.  Cardiovascular:  Positive for chest pain. Negative for dyspnea on exertion, leg swelling, palpitations and syncope.        Vitals:   04/28/21 1307  BP: 126/71  Pulse: 89  Resp: 16  SpO2: 96%     Body mass index is 32.86 kg/m. Filed Weights   04/28/21 1307  Weight: 203 lb 9.6 oz (92.4 kg)     Objective:   Physical Exam Vitals and nursing note reviewed.  Constitutional:      General: She is not in acute distress. Neck:     Vascular: No JVD.  Cardiovascular:     Rate and Rhythm: Regular rhythm. Tachycardia present.     Pulses: Normal pulses.     Heart sounds: Normal heart sounds. No murmur heard. Pulmonary:     Effort: Pulmonary effort is normal.     Breath sounds: Normal breath sounds. No wheezing or rales.  Musculoskeletal:     Right lower leg: No edema.     Left lower leg: No edema.        Assessment & Recommendations:   55 y.o. African American female with hypertension, mixed hyperlipidemia, h/o stroke (2018), CAD with stable angina  CAD: Calcium score 21. Mild ischemia on stress testing (11/2020). Patient still has angina symptoms, although the duration of pain and the fatigue is not entirely explained by even obstructive CAD. I discussed coronary angiography as definitive evaluation and potential treatment for her symptoms, given that they are not controlled with  current anti anginal therapy. Patient would like to proceed.  Continue aspirin 81 mg daily, Praluent, Imdur 30 mg daily. Increase metoprolol succinate to 50 mg daily.  Hypertension: Controll  Mixed hyperlipidemia: Management as above   Nigel Mormon, MD Pager: [phone] Office: [phone]

## 2021-05-16 NOTE — Interval H&P Note (Signed)
History and Physical Interval Note:  05/16/2021 8:36 AM  Veronica Lynch  has presented today for surgery, with the diagnosis of CAD.  The various methods of treatment have been discussed with the patient and family. After consideration of risks, benefits and other options for treatment, the patient has consented to  Procedure(s): LEFT HEART CATH AND CORONARY ANGIOGRAPHY (N/A) as a surgical intervention.  The patient's history has been reviewed, patient examined, no change in status, stable for surgery.  I have reviewed the patient's chart and labs.  Questions were answered to the patient's satisfaction.    2016/2017 Appropriate Use Criteria for Coronary Revascularization Symptom Status: Ischemic Symptoms  Non-invasive Testing: Low risk  If no or indeterminate stress test, FFR/iFR results in all diseased vessels: N/A  Diabetes Mellitus: No  S/P CABG: No  Antianginal therapy (number of long-acting drugs): >=2  Patient undergoing renal transplant: No  Patient undergoing percutaneous valve procedure: No  1 Vessel Disease PCI CABG  No proximal LAD involvement, No proximal left dominant LCX involvement A (7); Indication 1 M (5); Indication 1  Proximal left dominant LCX involvement A (7); Indication 4 A (7); Indication 4  Proximal LAD involvement A (7); Indication 4 A (7); Indication 4  2 Vessel Disease  No proximal LAD involvement A (7); Indication 7 M (6); Indication 7  Proximal LAD involvement A (7); Indication 10 A (7); Indication 10  3 Vessel Disease  Low disease complexity (e.g., focal stenoses, SYNTAX <=22) A (7); Indication 16 A (7); Indication 16  Intermediate or high disease complexity (e.g., SYNTAX >=23) M (6); Indication 20 A (8); Indication 20  Left Main Disease  Isolated LMCA disease: ostial or midshaft A (7); Indication 24 A (9); Indication 24  Isolated LMCA disease: bifurcation involvement M (6); Indication 25 A (9); Indication 25  LMCA ostial or midshaft, concurrent low  disease burden multivessel disease (e.g., 1-2 additional focal stenoses, SYNTAX <=22) A (7); Indication 26 A (9); Indication 26  LMCA ostial or midshaft, concurrent intermediate or high disease burden multivessel disease (e.g., 1-2 additional bifurcation stenoses, long stenoses, SYNTAX >=23) M (4); Indication 27 A (9); Indication 27  LMCA bifurcation involvement, concurrent low disease burden multivessel disease (e.g., 1-2 additional focal stenoses, SYNTAX <=22) M (6); Indication 28 A (9); Indication 28  LMCA bifurcation involvement, concurrent intermediate or high disease burden multivessel disease (e.g., 1-2 additional bifurcation stenoses, long stenoses, SYNTAX >=23) R (3); Indication 29 A (9); Indication 29       Azul Coffie J Bethene Hankinson

## 2021-05-16 NOTE — ED Provider Notes (Signed)
[facility] EMERGENCY DEPARTMENT Provider Note   CSN: [id number] Arrival date & time: 05/16/21  1936     History Chief Complaint  Patient presents with   Headache   Code Stroke    Veronica Lynch is a 55 y.o. female.  55 year old female with medical history as detailed below who presents for evaluation.  Patient was called as a code stroke when she presented in triage.  Patient was complaining of headache with associated left-sided weakness.  Of note, patient with cardiac catheterization performed earlier today with placement of stent.  Patient reported onset of headache with associated left-sided weakness.  Onset of headache was approximately 6 PM.  She was seen by the neuro team immediately after triage.  The history is provided by the patient.  Headache Pain location:  Generalized Quality:  Dull Radiates to:  Does not radiate Onset quality:  Sudden Timing:  Constant Progression:  Waxing and waning Chronicity:  New     Past Medical History:  Diagnosis Date   CVA (cerebral vascular accident) ([place]) 2018   CVA (cerebral vascular accident) ([place]) 2017   Diabetes ([place])    Hyperlipidemia    Hypertension    Shingles     Patient Active Problem List   Diagnosis Date Noted   Elevated coronary artery calcium score    Abnormal stress test 05/15/2021   Pain in right wrist 05/09/2021   De Quervain's tenosynovitis, right 04/13/2021   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   Coronary artery disease of native artery of native heart with stable angina pectoris ([place]) 12/17/2020   Statin intolerance 12/17/2020   Essential hypertension 11/17/2020   Mixed hyperlipidemia 11/17/2020   Palpitations 11/17/2020   Exertional chest pain 11/17/2020   Exertional dyspnea 11/17/2020    Past Surgical History:  Procedure Laterality Date   CHOLECYSTECTOMY     CHOLECYSTECTOMY  1990   CYST REMOVAL HAND Right    wrist   CYST REMOVAL LEG     left knee   CYST REMOVAL LEG  Left    knee   HYSTEROTOMY     LAPAROSCOPIC GASTRIC BANDING     LAPAROSCOPIC GASTRIC BANDING  2010   ROTATOR CUFF REPAIR Left    SHOULDER OPEN ROTATOR CUFF REPAIR     TOTAL ABDOMINAL HYSTERECTOMY       OB History   No obstetric history on file.     Family History  Problem Relation Age of Onset   Hypertension Mother    Hyperlipidemia Mother    Osteoporosis Mother    Cancer Father    Sarcoidosis Sister    Diabetes Maternal Aunt     Social History   Tobacco Use   Smoking status: Never   Smokeless tobacco: Never  Vaping Use   Vaping Use: Never used  Substance Use Topics   Alcohol use: Not Currently    Comment: occ wine   Drug use: Never    Home Medications Prior to Admission medications   Medication Sig Start Date End Date Taking? Authorizing Provider  Alirocumab (PRALUENT) 75 MG/ML SOAJ Inject 1 mL into the skin every 14 (fourteen) days. 04/11/21   Patwardhan, Reynold Bowen, MD  Ascorbic Acid (VITAMIN C) 1000 MG tablet Take 1,000 mg by mouth daily.    [provider]  aspirin EC 81 MG tablet Take 1 tablet (81 mg total) by mouth daily. Swallow whole. 11/24/20   Patwardhan, Reynold Bowen, MD  Biotin 10 MG TABS Take 10 mg by mouth daily.  [provider]  Cholecalciferol (VITAMIN D3) 125 MCG (5000 UT) CAPS Take 5,000 Units by mouth daily.    [provider]  clopidogrel (PLAVIX) 75 MG tablet Take 1 tablet (75 mg total) by mouth daily. 05/16/21 05/16/22  Patwardhan, Reynold Bowen, MD  isosorbide mononitrate (IMDUR) 30 MG 24 hr tablet Take 1 tablet (30 mg total) by mouth daily. 03/31/21 06/29/21  Patwardhan, Reynold Bowen, MD  metoprolol succinate (TOPROL-XL) 50 MG 24 hr tablet Take 1 tablet (50 mg total) by mouth daily. Take with or immediately following a meal. 04/28/21 07/27/21  Patwardhan, Reynold Bowen, MD  nitroGLYCERIN (NITROSTAT) 0.4 MG SL tablet Place 0.4 mg under the tongue every 5 (five) minutes as needed for chest pain.    [provider]  TRUE METRIX  BLOOD GLUCOSE TEST test strip 2 (two) times daily. 01/24/21   [provider]    Allergies    Bactrim [sulfamethoxazole-trimethoprim] and Codeine  Review of Systems   Review of Systems  Neurological:  Positive for headaches.  All other systems reviewed and are negative.  Physical Exam Updated Vital Signs BP (!) 151/68   Pulse 74   Temp 98.9 F (37.2 C) (Oral)   Resp 17   SpO2 100%   Physical Exam Vitals and nursing note reviewed.  Constitutional:      General: She is not in acute distress.    Appearance: Normal appearance. She is well-developed.  HENT:     Head: Normocephalic and atraumatic.  Eyes:     Conjunctiva/sclera: Conjunctivae normal.     Pupils: Pupils are equal, round, and reactive to light.  Cardiovascular:     Rate and Rhythm: Normal rate and regular rhythm.     Heart sounds: Normal heart sounds.  Pulmonary:     Effort: Pulmonary effort is normal. No respiratory distress.     Breath sounds: Normal breath sounds.  Abdominal:     General: There is no distension.     Palpations: Abdomen is soft.     Tenderness: There is no abdominal tenderness.  Musculoskeletal:        General: No deformity. Normal range of motion.     Cervical back: Normal range of motion and neck supple.  Skin:    General: Skin is warm and dry.  Neurological:     General: No focal deficit present.     Mental Status: She is alert and oriented to person, place, and time. Mental status is at baseline.     GCS: GCS eye subscore is 4. GCS verbal subscore is 5. GCS motor subscore is 6.     Cranial Nerves: No cranial nerve deficit, dysarthria or facial asymmetry.     Sensory: No sensory deficit.     Motor: Weakness present.    ED Results / Procedures / Treatments   Labs (all labs ordered are listed, but only abnormal results are displayed) Labs Reviewed  COMPREHENSIVE METABOLIC PANEL - Abnormal; Notable for the following components:      Result Value   Glucose, Bld 149 (*)     All other components within normal limits  I-STAT CHEM 8, ED - Abnormal; Notable for the following components:   Glucose, Bld 142 (*)    Calcium, Ion 1.09 (*)    All other components within normal limits  CBG MONITORING, ED - Abnormal; Notable for the following components:   Glucose-Capillary 135 (*)    All other components within normal limits  RESP PANEL BY RT-PCR (FLU A&B, COVID) ARPGX2  ETHANOL  PROTIME-INR  APTT  CBC  DIFFERENTIAL  RAPID URINE DRUG SCREEN, HOSP PERFORMED  URINALYSIS, ROUTINE W REFLEX MICROSCOPIC  MAGNESIUM  I-STAT BETA HCG BLOOD, ED ([facility], [facility], [facility] ONLY)    EKG EKG Interpretation  Date/Time:  Tuesday May 16 2021 21:09:26 EDT Ventricular Rate:  77 PR Interval:  141 QRS Duration: 77 QT Interval:  390 QTC Calculation: 442 R Axis:   19 Text Interpretation: Sinus rhythm Confirmed by Dene Gentry [id number]) on 05/16/2021 9:13:24 PM  Radiology MR BRAIN WO CONTRAST  Result Date: 05/16/2021 CLINICAL DATA:  Facial droop EXAM: MRI HEAD WITHOUT CONTRAST TECHNIQUE: Multiplanar, multiecho pulse sequences of the brain and surrounding structures were obtained without intravenous contrast. COMPARISON:  None. FINDINGS: Brain: There is a punctate focus of diffusion restriction in the region of the left caudate body, possibly artifactual. Otherwise, no evidence of acute ischemia. No acute or chronic hemorrhage. Normal white matter signal, parenchymal volume and CSF spaces. The midline structures are normal. Vascular: Major flow voids are preserved. Skull and upper cervical spine: Normal calvarium and skull base. Visualized upper cervical spine and soft tissues are normal. Sinuses/Orbits:No paranasal sinus fluid levels or advanced mucosal thickening. No mastoid or middle ear effusion. Normal orbits. IMPRESSION: Punctate focus of diffusion restriction in the region of the left caudate body, favored to be artifactual, particularly given that this location would not be causative of  the reported symptoms. Otherwise normal brain. Electronically Signed   By: Ulyses Jarred M.D.   On: 05/16/2021 20:49   CARDIAC CATHETERIZATION  Addendum Date: 05/16/2021   LM: Normal LAD: 40% prox LAD, 50% prox diag disease         LAD wraps around apex and gives left-to-right collaterals to distal RCA Lcx: Prox focal 80% stenosis        Left-to-right collaterals to distal RCA RCA: Prox subtotal occlusion, followed by mid 100% occlusion         Collateralized up to mid RCA by LAD and Lcx Successful OCT guided percutaneous coronary intervention prox Lcx        PTCA and stent placement 2.0 X 15 mm Onyx Frontier drug-eluting stent        Post dilatation using 2.25 mm balloon at 16 atm        80%---0% residual stenosis        112% stent expansion, MSA 3.6 mm2 Nigel Mormon, MD Pager: [phone] Office: [phone]   Result Date: 05/16/2021 Images from the original result were not included. LM: Normal LAD: 40% prox LAD, 50% prox diag disease         LAD wraps around apex and gives left-to-right collaterals to distal RCA Lcx: Prox focal 80% stenosis        Left-to-right collaterals to distal RCA RCA: Prox subtotal occlusion, followed by mid 100% occlusion         Collateralized up to mid RCA by LAD and Lcx Successful OCT guided percutaneous coronary intervention prox Lcx        PTCA and stent placement 2.0 X 15 mm Onyx Frontier drug-eluting stent        Post dilatation using 2.25 mm balloon at 16 atm        80%---0% residual stenosis        112% stent expansion, MSA 3.6 mm2 Nigel Mormon, MD Pager: [phone] Office: [phone]  CT HEAD CODE STROKE WO CONTRAST  Result Date: 05/16/2021 CLINICAL DATA:  Code stroke.  Left facial droop and arm weakness EXAM: CT  HEAD WITHOUT CONTRAST TECHNIQUE: Contiguous axial images were obtained from the base of the skull through the vertex without intravenous contrast. COMPARISON:  03/23/2021 FINDINGS: Brain: There is no mass, hemorrhage or extra-axial  collection. The size and configuration of the ventricles and extra-axial CSF spaces are normal. The brain parenchyma is normal, without evidence of acute or chronic infarction. Vascular: No abnormal hyperdensity of the major intracranial arteries or dural venous sinuses. No intracranial atherosclerosis. Skull: The visualized skull base, calvarium and extracranial soft tissues are normal. Sinuses/Orbits: No fluid levels or advanced mucosal thickening of the visualized paranasal sinuses. No mastoid or middle ear effusion. The orbits are normal. ASPECTS [facility] Stroke Program Early CT Score) - Ganglionic level infarction (caudate, lentiform nuclei, internal capsule, insula, M1-M3 cortex): 7 - Supraganglionic infarction (M4-M6 cortex): 3 Total score (0-10 with 10 being normal): 10 IMPRESSION: 1. Normal head CT. 2. ASPECTS is 10. These results were communicated to Dr. Roland Rack at 8:17 pm on 05/16/2021 by text page via the [facility] messaging system. Electronically Signed   By: Ulyses Jarred M.D.   On: 05/16/2021 20:17   CT ANGIO HEAD NECK W WO CM (CODE STROKE)  Result Date: 05/16/2021 CLINICAL DATA:  Stroke follow-up EXAM: CT ANGIOGRAPHY HEAD AND NECK TECHNIQUE: Multidetector CT imaging of the head and neck was performed using the standard protocol during bolus administration of intravenous contrast. Multiplanar CT image reconstructions and MIPs were obtained to evaluate the vascular anatomy. Carotid stenosis measurements (when applicable) are obtained utilizing NASCET criteria, using the distal internal carotid diameter as the denominator. CONTRAST:  68m OMNIPAQUE IOHEXOL 350 MG/ML SOLN COMPARISON:  None. FINDINGS: CTA NECK FINDINGS SKELETON: There is no bony spinal canal stenosis. No lytic or blastic lesion. OTHER NECK: Normal pharynx, larynx and major salivary glands. No cervical lymphadenopathy. Unremarkable thyroid gland. UPPER CHEST: No pneumothorax or pleural effusion. No nodules or masses. AORTIC  ARCH: There is no calcific atherosclerosis of the aortic arch. There is no aneurysm, dissection or hemodynamically significant stenosis of the visualized portion of the aorta. Conventional 3 vessel aortic branching pattern. The visualized proximal subclavian arteries are widely patent. RIGHT CAROTID SYSTEM: No dissection, occlusion or aneurysm. Mild atherosclerotic calcification at the carotid bifurcation without hemodynamically significant stenosis. LEFT CAROTID SYSTEM: Normal without aneurysm, dissection or stenosis. VERTEBRAL ARTERIES: Left dominant configuration. Both origins are clearly patent. There is no dissection, occlusion or flow-limiting stenosis to the skull base (V1-V3 segments). CTA HEAD FINDINGS POSTERIOR CIRCULATION: --Vertebral arteries: Normal V4 segments. --Inferior cerebellar arteries: Normal. --Basilar artery: Normal. --Superior cerebellar arteries: Normal. --Posterior cerebral arteries (PCA): Multifocal moderate-to-severe narrowing of the right PCA with short segment occlusion of the P3 segment. Normal left PCA. ANTERIOR CIRCULATION: --Intracranial internal carotid arteries: Atherosclerotic calcification of the internal carotid arteries at the skull base without hemodynamically significant stenosis. --Anterior cerebral arteries (ACA): Normal. Both A1 segments are present. Patent anterior communicating artery (a-comm). --Middle cerebral arteries (MCA): Normal. VENOUS SINUSES: As permitted by contrast timing, patent. ANATOMIC VARIANTS: None Review of the MIP images confirms the above findings. IMPRESSION: 1. No emergent large vessel occlusion. 2. 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 3. Mild right carotid bifurcation atherosclerosis without hemodynamically significant stenosis by NASCET criteria. Electronically Signed   By: KUlyses JarredM.D.   On: 05/16/2021 20:46    Procedures Procedures   Medications  Ordered in ED Medications  iohexol (OMNIPAQUE) 350 MG/ML injection 60 mL (60 mLs Intravenous Contrast Given 05/16/21 2027)  prochlorperazine (COMPAZINE) injection 10 mg (10 mg Intravenous Given 05/16/21 2105)  diphenhydrAMINE (BENADRYL) injection 12.5 mg (12.5 mg Intravenous Given 05/16/21 2105)    ED Course  I have reviewed the triage vital signs and the nursing notes.  Pertinent labs & imaging results that were available during my care of the patient were reviewed by me and considered in my medical decision making (see chart for details).    MDM Rules/Calculators/A&P                           MDM  MSE complete  Veronica Lynch was evaluated in Emergency Department on 05/16/2021 for the symptoms described in the history of present illness. She was evaluated in the context of the global COVID-19 pandemic, which necessitated consideration that the patient might be at risk for infection with the SARS-CoV-2 virus that causes COVID-19. Institutional protocols and algorithms that pertain to the evaluation of patients at risk for COVID-19 are in a state of rapid change based on information released by regulatory bodies including the CDC and federal and state organizations. These policies and algorithms were followed during the patient's care in the ED.  Patient is presenting with reported headache and left-sided weakness.  Code Stroke initiated in triage.  Patient is not a candidate for tPA or other aggressive stroke treatment.  Patient's MRI does show punctate lesion.  This could essentially be artifactual versus possible showering from recent cath.  Neuro feels that symptoms of the patient are most likely related to complex migraine.  At time of admission patient does feel significantly improved.  Neuro recommends admission for observation overnight.   Final Clinical Impression(s) / ED Diagnoses Final diagnoses:  Acute nonintractable headache, unspecified headache type  Weakness    Rx  / DC Orders ED Discharge Orders     None        Valarie Merino, MD 05/16/21 2241

## 2021-05-16 NOTE — ED Provider Notes (Signed)
Emergency Medicine Provider Triage Evaluation Note  Veronica Lynch , a 55 y.o. female  was evaluated in triage.  Pt complains of headache, neck pain, family member noticed left facial droop and left sided weakness.  Facial droop has resolved but patient still feeling weak and left arm and leg. Had heart cath this morning.   Review of Systems  Positive: Weakness, facial droop Negative: Speech change, vision change  Physical Exam  BP (!) 149/72 (BP Location: Left Arm)   Pulse 75   Temp 98.9 F (37.2 C) (Oral)   Resp 15   SpO2 100%  Gen:   Awake, no distress   Resp:  Normal effort  MSK:   No deformity, RUE dressing in place Other:  No facial droop, alert, oriented, there is weakness in LUE and LLE  Medical Decision Making  Medically screening exam initiated at 8:00 PM.  Appropriate orders placed.  Otilio Miu was informed that the remainder of the evaluation will be completed by another provider, this initial triage assessment does not replace that evaluation, and the importance of remaining in the ED until their evaluation is complete.  Due to sudden onset headache with associated neurologic symptoms, will initiate stroke alert process.   Milagros Loll, MD 05/16/21 2005

## 2021-05-17 ENCOUNTER — Encounter (HOSPITAL_COMMUNITY): Payer: Self-pay | Admitting: Internal Medicine

## 2021-05-17 ENCOUNTER — Observation Stay (HOSPITAL_BASED_OUTPATIENT_CLINIC_OR_DEPARTMENT_OTHER): Payer: BC Managed Care – PPO

## 2021-05-17 DIAGNOSIS — I10 Essential (primary) hypertension: Secondary | ICD-10-CM | POA: Diagnosis not present

## 2021-05-17 DIAGNOSIS — G43909 Migraine, unspecified, not intractable, without status migrainosus: Secondary | ICD-10-CM

## 2021-05-17 DIAGNOSIS — I639 Cerebral infarction, unspecified: Secondary | ICD-10-CM | POA: Diagnosis not present

## 2021-05-17 DIAGNOSIS — I6389 Other cerebral infarction: Secondary | ICD-10-CM | POA: Diagnosis not present

## 2021-05-17 DIAGNOSIS — I25118 Atherosclerotic heart disease of native coronary artery with other forms of angina pectoris: Secondary | ICD-10-CM | POA: Diagnosis not present

## 2021-05-17 DIAGNOSIS — Z789 Other specified health status: Secondary | ICD-10-CM

## 2021-05-17 LAB — LIPID PANEL
Cholesterol: 130 mg/dL (ref 0–200)
HDL: 46 mg/dL (ref >40)
LDL Cholesterol: 72 mg/dL (ref 0–99)
Total CHOL/HDL Ratio: 2.8 RATIO
Triglycerides: 61 mg/dL (ref <150)
VLDL: 12 mg/dL (ref 0–40)

## 2021-05-17 LAB — ECHOCARDIOGRAM COMPLETE
AV Mean grad: 4 mmHg
AV Peak grad: 7.6 mmHg
Ao pk vel: 1.38 m/s
Area-P 1/2: 4.74 cm2
Height: 66 in
S' Lateral: 2.7 cm
Weight: 3120 oz

## 2021-05-17 LAB — CBG MONITORING, ED
Glucose-Capillary: 201 mg/dL — ABNORMAL HIGH (ref 70–99)
Glucose-Capillary: 151 mg/dL — ABNORMAL HIGH (ref 70–99)

## 2021-05-17 LAB — POCT ACTIVATED CLOTTING TIME: Activated Clotting Time: 324 seconds

## 2021-05-17 LAB — CBC
HCT: 33.1 % — ABNORMAL LOW (ref 36.0–46.0)
Hemoglobin: 11.4 g/dL — ABNORMAL LOW (ref 12.0–15.0)
MCH: 29.7 pg (ref 26.0–34.0)
MCHC: 34.4 g/dL (ref 30.0–36.0)
MCV: 86.2 fL (ref 80.0–100.0)
Platelets: 189 10*3/uL (ref 150–400)
RBC: 3.84 MIL/uL — ABNORMAL LOW (ref 3.87–5.11)
RDW: 12.8 % (ref 11.5–15.5)
WBC: 6.1 10*3/uL (ref 4.0–10.5)
nRBC: 0 % (ref 0.0–0.2)

## 2021-05-17 LAB — CREATININE, SERUM
Creatinine, Ser: 0.72 mg/dL (ref 0.44–1.00)
GFR, Estimated: >60 mL/min (ref >60)

## 2021-05-17 LAB — HEMOGLOBIN A1C
Hgb A1c MFr Bld: 6.3 % — ABNORMAL HIGH (ref 4.8–5.6)
Mean Plasma Glucose: 134.11 mg/dL

## 2021-05-17 LAB — MAGNESIUM: Magnesium: 2.1 mg/dL (ref 1.7–2.4)

## 2021-05-17 LAB — HIV ANTIBODY (ROUTINE TESTING W REFLEX): HIV Screen 4th Generation wRfx: NONREACTIVE

## 2021-05-17 MED ORDER — STROKE: EARLY STAGES OF RECOVERY BOOK
Freq: Once | Status: AC
  Filled 2021-05-17: qty 1

## 2021-05-17 MED ORDER — ENOXAPARIN SODIUM 40 MG/0.4ML IJ SOSY
40.0000 mg | PREFILLED_SYRINGE | INTRAMUSCULAR | Status: DC
  Administered 2021-05-17: 40 mg via SUBCUTANEOUS
  Filled 2021-05-17: qty 0.4

## 2021-05-17 MED ORDER — CLOPIDOGREL BISULFATE 75 MG PO TABS
75.0000 mg | ORAL_TABLET | Freq: Every day | ORAL | Status: DC
  Administered 2021-05-17: 75 mg via ORAL
  Filled 2021-05-17: qty 1

## 2021-05-17 MED ORDER — SODIUM CHLORIDE 0.9 % IV SOLN: INTRAVENOUS | Status: DC

## 2021-05-17 MED ORDER — ACETAMINOPHEN 160 MG/5ML PO SOLN: 650.0000 mg | ORAL | Status: DC | PRN

## 2021-05-17 MED ORDER — ACETAMINOPHEN 650 MG RE SUPP: 650.0000 mg | RECTAL | Status: DC | PRN

## 2021-05-17 MED ORDER — ASPIRIN EC 81 MG PO TBEC
81.0000 mg | DELAYED_RELEASE_TABLET | Freq: Every day | ORAL | Status: DC
  Administered 2021-05-17: 81 mg via ORAL
  Filled 2021-05-17: qty 1

## 2021-05-17 MED ORDER — INSULIN ASPART 100 UNIT/ML IJ SOLN
0.0000 [IU] | Freq: Three times a day (TID) | INTRAMUSCULAR | Status: DC
  Administered 2021-05-17: 2 [IU] via SUBCUTANEOUS
  Administered 2021-05-17: 3 [IU] via SUBCUTANEOUS

## 2021-05-17 MED ORDER — NITROGLYCERIN 0.4 MG SL SUBL: 0.4000 mg | SUBLINGUAL_TABLET | SUBLINGUAL | Status: DC | PRN

## 2021-05-17 MED ORDER — METOPROLOL SUCCINATE ER 25 MG PO TB24
50.0000 mg | ORAL_TABLET | Freq: Every day | ORAL | Status: DC
  Administered 2021-05-17: 50 mg via ORAL
  Filled 2021-05-17: qty 2

## 2021-05-17 MED ORDER — ACETAMINOPHEN 325 MG PO TABS: 650.0000 mg | ORAL_TABLET | ORAL | Status: DC | PRN

## 2021-05-17 NOTE — Progress Notes (Addendum)
STROKE TEAM PROGRESS NOTE   ATTENDING NOTE: I reviewed above note and agree with the assessment and plan. Pt was seen and examined.   55 year old female with history of diabetes, hypertension, hyperlipidemia, CAD status post stenting yesterday, infrequent migraine admitted for episode of left-sided severe headache, left-sided weakness, left facial droop and slurring speech.  Symptoms has resolved.  Patient does have migraine in the past, however infrequent, usually takes Excedrin and effective, however yesterday not effective.  CT no acute abnormality.  CT head and neck showed right occluded P1 versus fetal PCA and also right P2 occlusion.  MRI questionable DWI punctate signal change at left corona radiata periventricular space, artifact versus punctate infarct.  EF 60 to 65%.  LDL 72, A1c 6.3 creatinine 0.72.  UDS negative.  Neuro examination intact, no focal deficit.  Etiology for patient's symptoms concerning for complicated migraine versus TIA given right PCA stenosis/occlusion.  MRI finding either artifact or punctate infarct could be related to cardiac cath yesterday.  Recommend continue aspirin 81 and Plavix 75 per cardiology after stenting, continue Praluent for HLD management.  Stroke risk factor modification.  Avoid low BP.  PT and OT no recommendation.  We will follow-up with [facility] stroke clinic in 4 weeks.  For detailed assessment and plan, please refer to above as I have made changes wherever appropriate.   Neurology will sign off. Please call with questions. Pt will follow up with stroke clinic NP at [facility] in about 4 weeks. Thanks for the consult.   Marvel Plan, MD PhD Stroke Neurology 05/17/2021 3:17 PM    INTERVAL HISTORY She is lying on stretcher in NAD. No family is at bedside. Echo tech is at bedside. No new voiced neurological complaints or head. She states all symptoms have resolved.   Vitals:   05/17/21 0445 05/17/21 0600 05/17/21 0800 05/17/21 0917  BP: (!) 117/43 (!)  120/52 116/63 (!) 124/55  Pulse: 78 76 83 83  Resp: 15 14 19    Temp:      TempSrc:      SpO2: 99% 99% 100%    CBC:  Recent Labs  Lab 05/16/21 2005 05/16/21 2013 05/17/21 0359  WBC 5.7  --  6.1  NEUTROABS 3.5  --   --   HGB 13.1 13.3 11.4*  HCT 37.8 39.0 33.1*  MCV 86.9  --  86.2  PLT 224  --  189   Basic Metabolic Panel:  Recent Labs  Lab 05/16/21 2005 05/16/21 2013 05/17/21 0359  NA 138 141  --   K 3.7 3.7  --   CL 104 104  --   CO2 25  --   --   GLUCOSE 149* 142*  --   BUN 10 11  --   CREATININE 0.81 0.80 0.72  CALCIUM 9.1  --   --   MG  --   --  2.1   Lipid Panel:  Recent Labs  Lab 05/17/21 0359  CHOL 130  TRIG 61  HDL 46  CHOLHDL 2.8  VLDL 12  LDLCALC 72   HgbA1c:  Recent Labs  Lab 05/17/21 0359  HGBA1C 6.3*   Urine Drug Screen:  Recent Labs  Lab 05/16/21 1936  LABOPIA NONE DETECTED  COCAINSCRNUR NONE DETECTED  LABBENZ POSITIVE*  AMPHETMU NONE DETECTED  THCU NONE DETECTED  LABBARB NONE DETECTED    Alcohol Level  Recent Labs  Lab 05/16/21 2005  ETH <10    IMAGING past 24 hours MR BRAIN WO CONTRAST  Result Date:  05/16/2021 CLINICAL DATA:  Facial droop EXAM: MRI HEAD WITHOUT CONTRAST TECHNIQUE: Multiplanar, multiecho pulse sequences of the brain and surrounding structures were obtained without intravenous contrast. COMPARISON:  None. FINDINGS: Brain: There is a punctate focus of diffusion restriction in the region of the left caudate body, possibly artifactual. Otherwise, no evidence of acute ischemia. No acute or chronic hemorrhage. Normal white matter signal, parenchymal volume and CSF spaces. The midline structures are normal. Vascular: Major flow voids are preserved. Skull and upper cervical spine: Normal calvarium and skull base. Visualized upper cervical spine and soft tissues are normal. Sinuses/Orbits:No paranasal sinus fluid levels or advanced mucosal thickening. No mastoid or middle ear effusion. Normal orbits. IMPRESSION: Punctate  focus of diffusion restriction in the region of the left caudate body, favored to be artifactual, particularly given that this location would not be causative of the reported symptoms. Otherwise normal brain. Electronically Signed   By: Deatra Robinson M.D.   On: 05/16/2021 20:49   CT HEAD CODE STROKE WO CONTRAST  Result Date: 05/16/2021 CLINICAL DATA:  Code stroke.  Left facial droop and arm weakness EXAM: CT HEAD WITHOUT CONTRAST TECHNIQUE: Contiguous axial images were obtained from the base of the skull through the vertex without intravenous contrast. COMPARISON:  03/23/2021 FINDINGS: Brain: There is no mass, hemorrhage or extra-axial collection. The size and configuration of the ventricles and extra-axial CSF spaces are normal. The brain parenchyma is normal, without evidence of acute or chronic infarction. Vascular: No abnormal hyperdensity of the major intracranial arteries or dural venous sinuses. No intracranial atherosclerosis. Skull: The visualized skull base, calvarium and extracranial soft tissues are normal. Sinuses/Orbits: No fluid levels or advanced mucosal thickening of the visualized paranasal sinuses. No mastoid or middle ear effusion. The orbits are normal. ASPECTS [facility] Stroke Program Early CT Score) - Ganglionic level infarction (caudate, lentiform nuclei, internal capsule, insula, M1-M3 cortex): 7 - Supraganglionic infarction (M4-M6 cortex): 3 Total score (0-10 with 10 being normal): 10 IMPRESSION: 1. Normal head CT. 2. ASPECTS is 10. These results were communicated to Dr. Ritta Slot at 8:17 pm on 05/16/2021 by text page via the [facility] messaging system. Electronically Signed   By: Deatra Robinson M.D.   On: 05/16/2021 20:17   CT ANGIO HEAD NECK W WO CM (CODE STROKE)  Result Date: 05/16/2021 CLINICAL DATA:  Stroke follow-up EXAM: CT ANGIOGRAPHY HEAD AND NECK TECHNIQUE: Multidetector CT imaging of the head and neck was performed using the standard protocol during bolus  administration of intravenous contrast. Multiplanar CT image reconstructions and MIPs were obtained to evaluate the vascular anatomy. Carotid stenosis measurements (when applicable) are obtained utilizing NASCET criteria, using the distal internal carotid diameter as the denominator. CONTRAST:  47mL OMNIPAQUE IOHEXOL 350 MG/ML SOLN COMPARISON:  None. FINDINGS: CTA NECK FINDINGS SKELETON: There is no bony spinal canal stenosis. No lytic or blastic lesion. OTHER NECK: Normal pharynx, larynx and major salivary glands. No cervical lymphadenopathy. Unremarkable thyroid gland. UPPER CHEST: No pneumothorax or pleural effusion. No nodules or masses. AORTIC ARCH: There is no calcific atherosclerosis of the aortic arch. There is no aneurysm, dissection or hemodynamically significant stenosis of the visualized portion of the aorta. Conventional 3 vessel aortic branching pattern. The visualized proximal subclavian arteries are widely patent. RIGHT CAROTID SYSTEM: No dissection, occlusion or aneurysm. Mild atherosclerotic calcification at the carotid bifurcation without hemodynamically significant stenosis. LEFT CAROTID SYSTEM: Normal without aneurysm, dissection or stenosis. VERTEBRAL ARTERIES: Left dominant configuration. Both origins are clearly patent. There is no dissection, occlusion or flow-limiting  stenosis to the skull base (V1-V3 segments). CTA HEAD FINDINGS POSTERIOR CIRCULATION: --Vertebral arteries: Normal V4 segments. --Inferior cerebellar arteries: Normal. --Basilar artery: Normal. --Superior cerebellar arteries: Normal. --Posterior cerebral arteries (PCA): Multifocal moderate-to-severe narrowing of the right PCA with short segment occlusion of the P3 segment. Normal left PCA. ANTERIOR CIRCULATION: --Intracranial internal carotid arteries: Atherosclerotic calcification of the internal carotid arteries at the skull base without hemodynamically significant stenosis. --Anterior cerebral arteries (ACA): Normal. Both  A1 segments are present. Patent anterior communicating artery (a-comm). --Middle cerebral arteries (MCA): Normal. VENOUS SINUSES: As permitted by contrast timing, patent. ANATOMIC VARIANTS: None Review of the MIP images confirms the above findings. IMPRESSION: 1. No emergent large vessel occlusion. 2. 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 3. Mild right carotid bifurcation atherosclerosis without hemodynamically significant stenosis by NASCET criteria. Electronically Signed   By: Deatra Robinson M.D.   On: 05/16/2021 20:46    PHYSICAL EXAM  Temp:  [98.9 F (37.2 C)] 98.9 F (37.2 C) (11/01 2030) Pulse Rate:  [25-94] 83 (11/02 0917) Resp:  [12-21] 19 (11/02 0800) BP: (114-158)/(12-94) 124/55 (11/02 0917) SpO2:  [89 %-100 %] 100 % (11/02 0800)  General - Well nourished, well developed, in no apparent distress.  Ophthalmologic - fundi not visualized due to noncooperation.  Cardiovascular - Regular rhythm and rate.  Mental Status -  Level of arousal and orientation to time, place, and person were intact. Language including expression, naming, repetition, comprehension was assessed and found intact. Attention span and concentration were normal. Recent and remote memory were intact. Fund of Knowledge was assessed and was intact.  Cranial Nerves II - XII - II - Visual field intact OU. III, IV, VI - Extraocular movements intact. V - Facial sensation intact bilaterally. VII - Facial movement intact bilaterally. VIII - Hearing & vestibular intact bilaterally. X - Palate elevates symmetrically. XI - Chin turning & shoulder shrug intact bilaterally. XII - Tongue protrusion intact.  Motor Strength - The patient's strength was normal in all extremities and pronator drift was absent.  Bulk was normal and fasciculations were absent.   Motor Tone - Muscle tone was assessed at the neck and appendages and was  normal.  Sensory - Light touch, temperature/pinprick were assessed and were symmetrical.    Coordination - The patient had normal movements in the hands and feet with no ataxia or dysmetria.  Tremor was absent.  Gait and Station - deferred.   ASSESSMENT/PLAN Ms. ODESSEY BRAXTON is a 55 y.o. female with history of hypertension, mixed hyperlipidemia, h/o stroke (2018), CAD with stable angina, s/p PCI to pro Lcx on 11/1, presented to the ED with left-sided weakness and severe left-sided headache.  Complicated Migraine vs. TIA Code Stroke CT head No acute abnormality. ASPECTS 10.    CTA head & neck No LVO. 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 Mild right carotid bifurcation atherosclerosis without hemodynamically significant stenosis by NASCET criteria. MRI  Punctate focus of diffusion restriction in the region of the left caudate body, favored to be artifactual  2D Echo pending LDL 72 HgbA1c 6.3 VTE prophylaxis - Lovenox/ SCD'd    Diet   Diet heart healthy/carb modified Room service appropriate? Yes; Fluid consistency: Thin   aspirin 81 mg daily and clopidogrel 75 mg daily prior to admission, now on aspirin 81 mg daily and clopidogrel 75 mg daily.  Therapy recommendations:  pending  Disposition:  pending  Hypertension Home meds:  Toprol XL 50mg , Imdur 30mg  Stable Permissive hypertension (OK if < 220/120) but gradually normalize in 5-7 days Long-term BP goal normotensive  Hyperlipidemia Home meds:  Alirocumab,  LDL 72, goal < 70 Continue statin at discharge  Other Stroke Risk Factors Hx stroke/TIA Coronary artery disease s/p stenting 05/16/21 Migraines  Neurology will sign off, Please call with any questions  Hospital day # 0  , NP   To contact Stroke Continuity provider, please refer to 13/1/22. After hours, contact General Neurology

## 2021-05-17 NOTE — Progress Notes (Signed)
OT Cancellation Note  Patient Details Name: Veronica Lynch MRN: [id number] DOB: [birth year]   Cancelled Treatment:    Reason Eval/Treat Not Completed: OT screened, no needs identified, will sign off  Suzanna Obey 05/17/2021, 11:48 AM

## 2021-05-17 NOTE — Discharge Summary (Signed)
Physician Discharge Summary  Veronica Lynch WEX:[id number] DOB: [birth year] DOA: 05/16/2021  PCP: Bary Castilla, NP  Admit date: 05/16/2021 Discharge date: 05/17/2021  Time spent: 40 minutes  Recommendations for Outpatient Follow-up:  Follow outpatient CBC/CMP Follow with neurology outpatient for complex migraine Continue DAPT Continue praluent  Follow with cardiology outpatient     Discharge Diagnoses:  Principal Problem:   Acute CVA (cerebrovascular accident) [facility]) Active Problems:   Essential hypertension   Coronary artery disease of native artery of native heart with stable angina pectoris ([place])   Statin intolerance   Discharge Condition: stable  Diet recommendation: heart healthy  There were no vitals filed for this visit.  History of present illness:  Veronica Lynch is Veronica Lynch 55 y.o. female with history of CAD who has had stent placed yesterday morning and was discharged home started experiencing left-sided headache typical of her migraine was associated with left facial droop left upper and lower extremity weakness as witnessed by her daughter.  Patient's initially tried Excedrin but did not have any effect and was brought to the ER.  Denies any difficulty swallowing or speaking.  No visual symptoms.   ED Course: In the ER MRI of the brain shows left caudate nucleus diffusion restriction concerning for stroke.  CT angiogram of the head and neck does not show any large vessel obstruction.  EKG shows normal sinus rhythm.  Labs unremarkable COVID test negative.  Patient also had Veronica Lynch headache and was given migraine cocktail following which headache resolved and patient's weakness also resolved at the time of my exam patient is able to move all extremities 5 x 5.  Per patient's daughter who was at the bedside patient's strength are back to baseline.  She was admitted for migraine HA and L sided weakness.  Now seen by neurology and findings thought to be c/w complex migraine.  Plan  for discharge at this time with outpatient follow up.    Hospital Course:  Complex Migraine  Left Sided Weakness  Headache Neurology concerned for complex migraine MRI noted punctate focus of diffusion restriction in the region of the L caudate body, favored to be artifactual CTA head/neck without LVO, multifocal moderate to severe narrowing of R posterior cerebral artery with short segment occlusion of P3 segment, mild R carotid bifurcation atherosclorsis without hemodynamically significant stenosis by nascet criteria Head CT wnl Echo with normal EF, grade 1 diastolic dysfunction Neurology recommending outpatient neurology follow up, aspirin, plavix, praluent as prescribed.    CAD s/p Stent Placement S/p PCI 11/1 Continue DAPT, aspirin, plavix and praluent  Follow outpatient with cardiology as planned  T2DM A1c 6.3, follow outpatient  Diet controlled  Procedures: Echo IMPRESSIONS     1. Left ventricular ejection fraction, by estimation, is 60 to 65%. The  left ventricle has normal function. The left ventricle has no regional  wall motion abnormalities. Left ventricular diastolic parameters are  consistent with Grade I diastolic  dysfunction (impaired relaxation).   2. Right ventricular systolic function is normal. The right ventricular  size is normal.   3. The mitral valve is normal in structure. No evidence of mitral valve  regurgitation.   4. The aortic valve is normal in structure. Aortic valve regurgitation is  not visualized. No aortic stenosis is present.   5. The inferior vena cava is normal in size with greater than 50%  respiratory variability, suggesting right atrial pressure of 3 mmHg.   Comparison(s): No prior Echocardiogram.   Conclusion(s)/Recommendation(s): Normal biventricular function without  evidence of hemodynamically significant valvular heart disease.  LHC LM: Normal LAD: 40% prox LAD, 50% prox diag disease         LAD wraps around apex and  gives left-to-right collaterals to distal RCA Lcx: Prox focal 80% stenosis        Left-to-right collaterals to distal RCA RCA: Prox subtotal occlusion, followed by mid 100% occlusion         Collateralized up to mid RCA by LAD and Lcx   Successful OCT guided percutaneous coronary intervention prox Lcx        PTCA and stent placement 2.0 X 15 mm Onyx Frontier drug-eluting stent        Post dilatation using 2.25 mm balloon at 16 atm        80%---0% residual stenosis        112% stent expansion, MSA 3.6 mm2  Consultations: Neurology cardiology  Discharge Exam: Vitals:   05/17/21 1100 05/17/21 1300  BP: (!) 130/51 131/62  Pulse: 78 86  Resp: 16 15  Temp:    SpO2: 100% 99%   Eager to discharge Children at bedside, discussed d/c plan  General: No acute distress. Cardiovascular: Heart sounds show Veronica Lynch regular rate, and rhythm.  Lungs: Clear to auscultation bilaterally Abdomen: Soft, nontender, nondistended  Neurological: Alert and oriented 3. Moves all extremities 4 with equal strength. Cranial nerves II through XII grossly intact. Skin: Warm and dry. No rashes or lesions. Extremities: No clubbing or cyanosis. No edema.   Discharge Instructions   Discharge Instructions     Ambulatory referral to Neurology   Complete by: As directed    An appointment is requested in approximately: 4 weeks   Diet - low sodium heart healthy   Complete by: As directed    Discharge instructions   Complete by: As directed    You were seen for headache and numbness and weakness.    We think these symptoms were related to Veronica Lynch complex migraine.  Your MRI had findings that could be artifact, but it's difficult to completely rule out that this could be related to the catheterization yesterday.  Continue your aspirin, plavix, and praluent for your coronary artery disease (this also protects against strokes).   We'll recommend neurology follow up outpatient in addition to your cardiology follow  up.  Your echo showed diastolic dysfunction, this is impaired relaxation of the heart that can be seen with age and/or long standing high blood pressure.  Otherwise, your echo was relatively unremarkable.  Follow this up with your cardiologist.    Return for new, recurrent, or worsening symptoms.  Please ask your PCP to request records from this hospitalization so they know what was done and what the next steps will be.   Increase activity slowly   Complete by: As directed       Allergies as of 05/17/2021       Reactions   Bactrim [sulfamethoxazole-trimethoprim] Hives, Itching   Codeine Other (See Comments)   Pass out         Medication List     STOP taking these medications    aspirin-acetaminophen-caffeine 250-250-65 MG tablet Commonly known as: EXCEDRIN MIGRAINE       TAKE these medications    aspirin EC 81 MG tablet Take 1 tablet (81 mg total) by mouth daily. Swallow whole.   Biotin 10 MG Tabs Take 10 mg by mouth daily.   clopidogrel 75 MG tablet Commonly known as: Plavix Take 1 tablet (75 mg total) by  mouth daily.   isosorbide mononitrate 30 MG 24 hr tablet Commonly known as: IMDUR Take 1 tablet (30 mg total) by mouth daily.   metoprolol succinate 50 MG 24 hr tablet Commonly known as: TOPROL-XL Take 1 tablet (50 mg total) by mouth daily. Take with or immediately following Zekiah Coen meal.   nitroGLYCERIN 0.4 MG SL tablet Commonly known as: NITROSTAT Place 0.4 mg under the tongue every 5 (five) minutes as needed for chest pain.   Praluent 75 MG/ML Soaj Generic drug: Alirocumab Inject 1 mL into the skin every 14 (fourteen) days.   True Metrix Blood Glucose Test test strip Generic drug: glucose blood Check blood sugar once daily   vitamin C 1000 MG tablet Take 1,000 mg by mouth daily.   Vitamin D3 125 MCG (5000 UT) Caps Take 5,000 Units by mouth daily.       Allergies  Allergen Reactions   Bactrim [Sulfamethoxazole-Trimethoprim] Hives and Itching    Codeine Other (See Comments)    Pass out       The results of significant diagnostics from this hospitalization (including imaging, microbiology, ancillary and laboratory) are listed below for reference.    Significant Diagnostic Studies: MR BRAIN WO CONTRAST  Result Date: 05/16/2021 CLINICAL DATA:  Facial droop EXAM: MRI HEAD WITHOUT CONTRAST TECHNIQUE: Multiplanar, multiecho pulse sequences of the brain and surrounding structures were obtained without intravenous contrast. COMPARISON:  None. FINDINGS: Brain: There is Veronica Lynch punctate focus of diffusion restriction in the region of the left caudate body, possibly artifactual. Otherwise, no evidence of acute ischemia. No acute or chronic hemorrhage. Normal white matter signal, parenchymal volume and CSF spaces. The midline structures are normal. Vascular: Major flow voids are preserved. Skull and upper cervical spine: Normal calvarium and skull base. Visualized upper cervical spine and soft tissues are normal. Sinuses/Orbits:No paranasal sinus fluid levels or advanced mucosal thickening. No mastoid or middle ear effusion. Normal orbits. IMPRESSION: Punctate focus of diffusion restriction in the region of the left caudate body, favored to be artifactual, particularly given that this location would not be causative of the reported symptoms. Otherwise normal brain. Electronically Signed   By: Ulyses Jarred M.D.   On: 05/16/2021 20:49   CARDIAC CATHETERIZATION  Addendum Date: 05/16/2021   LM: Normal LAD: 40% prox LAD, 50% prox diag disease         LAD wraps around apex and gives left-to-right collaterals to distal RCA Lcx: Prox focal 80% stenosis        Left-to-right collaterals to distal RCA RCA: Prox subtotal occlusion, followed by mid 100% occlusion         Collateralized up to mid RCA by LAD and Lcx Successful OCT guided percutaneous coronary intervention prox Lcx        PTCA and stent placement 2.0 X 15 mm Onyx Frontier drug-eluting stent        Post  dilatation using 2.25 mm balloon at 16 atm        80%---0% residual stenosis        112% stent expansion, MSA 3.6 mm2 Veronica Mormon, MD Pager: [phone] Office: [phone]   Result Date: 05/16/2021 Images from the original result were not included. LM: Normal LAD: 40% prox LAD, 50% prox diag disease         LAD wraps around apex and gives left-to-right collaterals to distal RCA Lcx: Prox focal 80% stenosis        Left-to-right collaterals to distal RCA RCA: Prox subtotal occlusion, followed by mid 100%  occlusion         Collateralized up to mid RCA by LAD and Lcx Successful OCT guided percutaneous coronary intervention prox Lcx        PTCA and stent placement 2.0 X 15 mm Onyx Frontier drug-eluting stent        Post dilatation using 2.25 mm balloon at 16 atm        80%---0% residual stenosis        112% stent expansion, MSA 3.6 mm2 Veronica Mormon, MD Pager: [phone] Office: [phone]  MR Wrist Right w/o contrast  Result Date: 05/08/2021 CLINICAL DATA:  Radial sided right wrist pain. History of ganglion cyst removal in 2010 EXAM: MR OF THE RIGHT WRIST WITHOUT CONTRAST TECHNIQUE: Multiplanar, multisequence MR imaging of the right wrist was performed. No intravenous contrast was administered. COMPARISON:  X-ray 04/13/2021, 03/08/2021 FINDINGS: Technical Note: Despite efforts by the technologist and patient, motion artifact is present on today's exam and could not be eliminated. This reduces exam sensitivity and specificity. Ligaments: Intact scapholunate and lunotriquetral ligaments. Triangular fibrocartilage: Grossly intact on non-arthrographic imaging. Tendons: Flexor and extensor tendons appear grossly intact. Mild tenosynovitis of the second and third extensor compartments located at the level of the distal crossover. Carpal tunnel/median nerve: Normal carpal tunnel. Normal median nerve. Guyon's canal: Normal. Joint/cartilage: Mild arthropathy of the first CMC joint. Evaluation of the  articular cartilage is limited on motion degraded images. No joint effusion. No definite erosion. Bones/carpal alignment: No acute fracture. No dislocation. Scattered small rounded T2 hyperintense foci within the carpal bones, likely small subchondral cysts or intraosseous ganglia. No suspicious bone lesion. Other: No solid or cystic mass is seen about the wrist. IMPRESSION: 1. Motion degraded images. 2. 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 3. Mild arthropathy of the first [facility] joint. Electronically Signed   By: Davina Poke D.O.   On: 05/08/2021 11:16   ECHOCARDIOGRAM COMPLETE  Result Date: 05/17/2021    ECHOCARDIOGRAM REPORT   Patient Name:   FONDA ROCHON Date of Exam: 05/17/2021 Medical Rec #:  [id number]       Height:       66.0 in Accession #:    [id number]      Weight:       195.0 lb Date of Birth:  1965-08-19       BSA:          1.978 m Patient Age:    39 years        BP:           117/43 mmHg Patient Gender: F               HR:           80 bpm. Exam Location:  Inpatient Procedure: 2D Echo, Cardiac Doppler and Color Doppler Indications:    Stroke  History:        Patient has no prior history of Echocardiogram examinations.                 CAD, Stroke; Risk Factors:Hypertension.  Sonographer:    Glo Herring Referring Phys: [place]  1. Left ventricular ejection fraction, by estimation, is 60 to 65%. The left ventricle has normal function. The left ventricle has no regional wall motion abnormalities. Left ventricular diastolic parameters are consistent with Grade I diastolic dysfunction (impaired relaxation).  2. Right ventricular systolic function is normal. The right ventricular size is normal.  3. The mitral valve is normal in structure. No evidence of mitral valve regurgitation.  4. The aortic valve is normal in  structure. Aortic valve regurgitation is not visualized. No aortic stenosis is present.  5. The inferior vena cava is normal in size with greater than 50% respiratory variability, suggesting right atrial pressure of 3 mmHg. Comparison(s): No prior Echocardiogram. Conclusion(s)/Recommendation(s): Normal biventricular function without evidence of hemodynamically significant valvular heart disease. FINDINGS  Left Ventricle: Left ventricular ejection fraction, by estimation, is 60 to 65%. The left ventricle has normal function. The left ventricle has no regional wall motion abnormalities. The left ventricular internal cavity size was normal in size. There is  no left ventricular hypertrophy. Left ventricular diastolic parameters are consistent with Grade I diastolic dysfunction (impaired relaxation). Right Ventricle: The right ventricular size is normal. No increase in right ventricular wall thickness. Right ventricular systolic function is normal. Left Atrium: Left atrial size was normal in size. Right Atrium: Right atrial size was normal in size. Pericardium: There is no evidence of pericardial effusion. Mitral Valve: The mitral valve is normal in structure. No evidence of mitral valve regurgitation. Tricuspid Valve: The tricuspid valve is normal in structure. Tricuspid valve regurgitation is not demonstrated. Aortic Valve: The aortic valve is normal in structure. Aortic valve regurgitation is not visualized. No aortic stenosis is present. Aortic valve mean gradient measures 4.0 mmHg. Aortic valve peak gradient measures 7.6 mmHg. Pulmonic Valve: The pulmonic valve was not well visualized. Pulmonic valve regurgitation is not visualized. Aorta: The aortic root and ascending aorta are structurally normal, with no evidence of dilitation. Venous: The inferior vena cava is normal in size with greater than 50% respiratory variability, suggesting right atrial pressure of 3 mmHg. IAS/Shunts: No atrial level shunt detected by  color flow Doppler.  LEFT VENTRICLE PLAX 2D LVIDd:         4.00 cm Diastology LVIDs:         2.70 cm LV e' medial:    7.51 cm/s LV PW:         0.90 cm LV E/e' medial:  9.2 LV IVS:        0.90 cm LV e' lateral:   11.60 cm/s                        LV E/e' lateral: 6.0  IVC IVC diam: 1.60 cm LEFT ATRIUM             Index LA diam:        4.00 cm 2.02 cm/m LA Vol (A2C):   38.9 ml 19.66 ml/m LA Vol (A4C):   36.2 ml 18.30 ml/m LA Biplane Vol: 38.0 ml 19.21 ml/m  AORTIC VALVE                   PULMONIC VALVE AV Vmax:           138.00 cm/s PV Vmax:       0.99 m/s AV Vmean:          88.600 cm/s PV Peak grad:  3.9 mmHg AV VTI:            0.278 m AV Peak Grad:      7.6 mmHg AV Mean Grad:      4.0 mmHg LVOT Vmax:         74.40 cm/s LVOT Vmean:  48.500 cm/s LVOT VTI:          0.156 m LVOT/AV VTI ratio: 0.56  AORTA Ao Root diam: 3.00 cm Ao Asc diam:  2.90 cm MITRAL VALVE MV Area (PHT): 4.74 cm    SHUNTS MV Decel Time: 160 msec    Systemic VTI: 0.16 m MV E velocity: 69.40 cm/s MV Veronica Lynch velocity: 75.40 cm/s MV E/Jatia Musa ratio:  0.92 Veronica Lynch signed by Phineas Inches Signature Date/Time: 05/17/2021/2:29:17 PM    Final    CT HEAD CODE STROKE WO CONTRAST  Result Date: 05/16/2021 CLINICAL DATA:  Code stroke.  Left facial droop and arm weakness EXAM: CT HEAD WITHOUT CONTRAST TECHNIQUE: Contiguous axial images were obtained from the base of the skull through the vertex without intravenous contrast. COMPARISON:  03/23/2021 FINDINGS: Brain: There is no mass, hemorrhage or extra-axial collection. The size and configuration of the ventricles and extra-axial CSF spaces are normal. The brain parenchyma is normal, without evidence of acute or chronic infarction. Vascular: No abnormal hyperdensity of the major intracranial arteries or dural venous sinuses. No intracranial atherosclerosis. Skull: The visualized skull base, calvarium and extracranial soft tissues are normal. Sinuses/Orbits: No fluid levels or advanced mucosal  thickening of the visualized paranasal sinuses. No mastoid or middle ear effusion. The orbits are normal. ASPECTS [facility] Stroke Program Early CT Score) - Ganglionic level infarction (caudate, lentiform nuclei, internal capsule, insula, M1-M3 cortex): 7 - Supraganglionic infarction (M4-M6 cortex): 3 Total score (0-10 with 10 being normal): 10 IMPRESSION: 1. Normal head CT. 2. ASPECTS is 10. These results were communicated to Dr. Roland Rack at 8:17 pm on 05/16/2021 by text page via the [facility] messaging system. Electronically Signed   By: Ulyses Jarred M.D.   On: 05/16/2021 20:17   CT ANGIO HEAD NECK W WO CM (CODE STROKE)  Result Date: 05/16/2021 CLINICAL DATA:  Stroke follow-up EXAM: CT ANGIOGRAPHY HEAD AND NECK TECHNIQUE: Multidetector CT imaging of the head and neck was performed using the standard protocol during bolus administration of intravenous contrast. Multiplanar CT image reconstructions and MIPs were obtained to evaluate the vascular anatomy. Carotid stenosis measurements (when applicable) are obtained utilizing NASCET criteria, using the distal internal carotid diameter as the denominator. CONTRAST:  7m OMNIPAQUE IOHEXOL 350 MG/ML SOLN COMPARISON:  None. FINDINGS: CTA NECK FINDINGS SKELETON: There is no bony spinal canal stenosis. No lytic or blastic lesion. OTHER NECK: Normal pharynx, larynx and major salivary glands. No cervical lymphadenopathy. Unremarkable thyroid gland. UPPER CHEST: No pneumothorax or pleural effusion. No nodules or masses. AORTIC ARCH: There is no calcific atherosclerosis of the aortic arch. There is no aneurysm, dissection or hemodynamically significant stenosis of the visualized portion of the aorta. Conventional 3 vessel aortic branching pattern. The visualized proximal subclavian arteries are widely patent. RIGHT CAROTID SYSTEM: No dissection, occlusion or aneurysm. Mild atherosclerotic calcification at the carotid bifurcation without hemodynamically significant  stenosis. LEFT CAROTID SYSTEM: Normal without aneurysm, dissection or stenosis. VERTEBRAL ARTERIES: Left dominant configuration. Both origins are clearly patent. There is no dissection, occlusion or flow-limiting stenosis to the skull base (V1-V3 segments). CTA HEAD FINDINGS POSTERIOR CIRCULATION: --Vertebral arteries: Normal V4 segments. --Inferior cerebellar arteries: Normal. --Basilar artery: Normal. --Superior cerebellar arteries: Normal. --Posterior cerebral arteries (PCA): Multifocal moderate-to-severe narrowing of the right PCA with short segment occlusion of the P3 segment. Normal left PCA. ANTERIOR CIRCULATION: --Intracranial internal carotid arteries: Atherosclerotic calcification of the internal carotid arteries at the skull base without hemodynamically significant stenosis. --Anterior cerebral arteries (ACA): Normal. Both A1 segments  are present. Patent anterior communicating artery (Veronica Lynch). --Middle cerebral arteries (MCA): Normal. VENOUS SINUSES: As permitted by contrast timing, patent. ANATOMIC VARIANTS: None Review of the MIP images confirms the above findings. IMPRESSION: 1. No emergent large vessel occlusion. 2. 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 3. Mild right carotid bifurcation atherosclerosis without hemodynamically significant stenosis by NASCET criteria. Electronically Signed   By: Ulyses Jarred M.D.   On: 05/16/2021 20:46    Microbiology: Recent Results (from the past 240 hour(s))  Resp Panel by RT-PCR (Flu Volanda Mangine&B, Covid) Nasopharyngeal Swab     Status: None   Collection Time: 05/16/21  8:00 PM   Specimen: Nasopharyngeal Swab; Nasopharyngeal(NP) swabs in vial transport medium  Result Value Ref Range Status   SARS Coronavirus 2 by RT PCR NEGATIVE NEGATIVE Final    Comment: (NOTE) SARS-CoV-2 target nucleic acids are NOT DETECTED.  The SARS-CoV-2 RNA is generally detectable in upper  respiratory specimens during the acute phase of infection. The lowest concentration of SARS-CoV-2 viral copies this assay can detect is 138 copies/mL. Veronica Lynch negative result does not preclude SARS-Cov-2 infection and should not be used as the sole basis for treatment or other patient management decisions. Veronica Lynch negative result may occur with  improper specimen collection/handling, submission of specimen other than nasopharyngeal swab, presence of viral mutation(s) within the areas targeted by this assay, and inadequate number of viral copies(<138 copies/mL). Veronica Lynch negative result must be combined with clinical observations, patient history, and epidemiological information. The expected result is Negative.  Fact Sheet for Patients:  EntrepreneurPulse.com.au  Fact Sheet for Healthcare Providers:  IncredibleEmployment.be  This test is no t yet approved or cleared by the [place] FDA and  has been authorized for detection and/or diagnosis of SARS-CoV-2 by FDA under an Emergency Use Authorization (EUA). This EUA will remain  in effect (meaning this test can be used) for the duration of the COVID-19 declaration under Section 564(b)(1) of the Act, 21 U.S.C.section 360bbb-3(b)(1), unless the authorization is terminated  or revoked sooner.       Influenza Veronica Lynch by PCR NEGATIVE NEGATIVE Final   Influenza B by PCR NEGATIVE NEGATIVE Final    Comment: (NOTE) The Xpert Xpress SARS-CoV-2/FLU/RSV plus assay is intended as an aid in the diagnosis of influenza from Nasopharyngeal swab specimens and should not be used as Veronica Lynch sole basis for treatment. Nasal washings and aspirates are unacceptable for Xpert Xpress SARS-CoV-2/FLU/RSV testing.  Fact Sheet for Patients: EntrepreneurPulse.com.au  Fact Sheet for Healthcare Providers: IncredibleEmployment.be  This test is not yet approved or cleared by the [place] FDA and has been  authorized for detection and/or diagnosis of SARS-CoV-2 by FDA under an Emergency Use Authorization (EUA). This EUA will remain in effect (meaning this test can be used) for the duration of the COVID-19 declaration under Section 564(b)(1) of the Act, 21 U.S.C. section 360bbb-3(b)(1), unless the authorization is terminated or revoked.  Performed at [facility], [place] [address], [place], [place] [address]      Labs: Basic Metabolic Panel: Recent Labs  Lab 05/16/21 2005 05/16/21 2013 05/17/21 0359  NA 138 141  --   K 3.7 3.7  --   CL 104 104  --   CO2 25  --   --   GLUCOSE 149* 142*  --   BUN 10 11  --   CREATININE 0.81 0.80 0.72  CALCIUM 9.1  --   --   MG  --   --  2.1   Liver Function Tests: Recent Labs  Lab 05/16/21 2005  AST 29  ALT 36  ALKPHOS 68  BILITOT 0.3  PROT 7.3  ALBUMIN 4.0   No results for input(s): LIPASE, AMYLASE in the last 168 hours. No results for input(s): AMMONIA in the last 168 hours. CBC: Recent Labs  Lab 05/16/21 2005 05/16/21 2013 05/17/21 0359  WBC 5.7  --  6.1  NEUTROABS 3.5  --   --   HGB 13.1 13.3 11.4*  HCT 37.8 39.0 33.1*  MCV 86.9  --  86.2  PLT 224  --  189   Cardiac Enzymes: No results for input(s): CKTOTAL, CKMB, CKMBINDEX, TROPONINI in the last 168 hours. BNP: BNP (last 3 results) No results for input(s): BNP in the last 8760 hours.  ProBNP (last 3 results) No results for input(s): PROBNP in the last 8760 hours.  CBG: Recent Labs  Lab 05/16/21 0811 05/16/21 2000 05/16/21 2126 05/17/21 0814 05/17/21 1116  GLUCAP 163* 135* 150* 201* 151*       Signed:  Fayrene Helper MD.  [facility] 05/17/2021, 2:58 PM

## 2021-05-17 NOTE — H&P (Signed)
History and Physical    Veronica Lynch QZE:[id number] DOB: June 04, 1966 DOA: 05/16/2021  PCP: Bary Castilla, NP  Patient coming from: Home.  Chief Complaint: Left-sided weakness.  HPI: Veronica Lynch is a 55 y.o. female with history of CAD who has had stent placed yesterday morning and was discharged home started experiencing left-sided headache typical of her migraine was associated with left facial droop left upper and lower extremity weakness as witnessed by her daughter.  Patient's initially tried Excedrin but did not have any effect and was brought to the ER.  Denies any difficulty swallowing or speaking.  No visual symptoms.  ED Course: In the ER MRI of the brain shows left caudate nucleus diffusion restriction concerning for stroke.  CT angiogram of the head and neck does not show any large vessel obstruction.  EKG shows normal sinus rhythm.  Labs unremarkable COVID test negative.  Patient also had a headache and was given migraine cocktail following which headache resolved and patient's weakness also resolved at the time of my exam patient is able to move all extremities 5 x 5.  Per patient's daughter who was at the bedside patient's strength are back to baseline.  Review of Systems: As per HPI, rest all negative.   Past Medical History:  Diagnosis Date   CVA (cerebral vascular accident) ([place]) 2018   CVA (cerebral vascular accident) ([place]) 2017   Diabetes ([place])    Hyperlipidemia    Hypertension    Shingles     Past Surgical History:  Procedure Laterality Date   CHOLECYSTECTOMY     CHOLECYSTECTOMY  1990   CYST REMOVAL HAND Right    wrist   CYST REMOVAL LEG     left knee   CYST REMOVAL LEG Left    knee   HYSTEROTOMY     LAPAROSCOPIC GASTRIC BANDING     LAPAROSCOPIC GASTRIC BANDING  2010   ROTATOR CUFF REPAIR Left    SHOULDER OPEN ROTATOR CUFF REPAIR     TOTAL ABDOMINAL HYSTERECTOMY       reports that she has never smoked. She has never used smokeless tobacco. She  reports that she does not currently use alcohol. She reports that she does not use drugs.  Allergies  Allergen Reactions   Bactrim [Sulfamethoxazole-Trimethoprim] Hives and Itching   Codeine Other (See Comments)    Pass out     Family History  Problem Relation Age of Onset   Hypertension Mother    Hyperlipidemia Mother    Osteoporosis Mother    Cancer Father    Sarcoidosis Sister    Diabetes Maternal Aunt     Prior to Admission medications   Medication Sig Start Date End Date Taking? Authorizing Provider  Alirocumab (PRALUENT) 75 MG/ML SOAJ Inject 1 mL into the skin every 14 (fourteen) days. 04/11/21  Yes Patwardhan, Reynold Bowen, MD  Ascorbic Acid (VITAMIN C) 1000 MG tablet Take 1,000 mg by mouth daily.   Yes [provider]  aspirin EC 81 MG tablet Take 1 tablet (81 mg total) by mouth daily. Swallow whole. 11/24/20  Yes Patwardhan, Reynold Bowen, MD  aspirin-acetaminophen-caffeine (EXCEDRIN MIGRAINE) [phone] MG tablet Take 1 tablet by mouth every 6 (six) hours as needed for headache or migraine.   Yes [provider]  Biotin 10 MG TABS Take 10 mg by mouth daily.   Yes [provider]  Cholecalciferol (VITAMIN D3) 125 MCG (5000 UT) CAPS Take 5,000 Units by mouth daily.   Yes [provider]  clopidogrel (PLAVIX) 75 MG tablet Take 1 tablet (75 mg total) by mouth daily. 05/16/21 05/16/22 Yes Patwardhan, Manish J, MD  isosorbide mononitrate (IMDUR) 30 MG 24 hr tablet Take 1 tablet (30 mg total) by mouth daily. 03/31/21 06/29/21 Yes Patwardhan, Manish J, MD  metoprolol succinate (TOPROL-XL) 50 MG 24 hr tablet Take 1 tablet (50 mg total) by mouth daily. Take with or immediately following a meal. 04/28/21 07/27/21 Yes Patwardhan, Manish J, MD  nitroGLYCERIN (NITROSTAT) 0.4 MG SL tablet Place 0.4 mg under the tongue every 5 (five) minutes as needed for chest pain.   Yes [provider]  TRUE METRIX BLOOD GLUCOSE TEST test strip Check blood sugar once daily  01/24/21  Yes [provider]    Physical Exam: Constitutional: Moderately built and nourished. Vitals:   05/16/21 2130 05/16/21 2145 05/16/21 2200 05/16/21 2215  BP: (!) 138/57 (!) 137/56 (!) 143/61 (!) 136/55  Pulse: 74 79 72 78  Resp: 18 (!) _0 Temp:      TempSrc:      SpO2: 100% 100% 100% 100%   Eyes: Anicteric no pallor. ENMT: No discharge from the ears eyes nose and mouth. Neck: No mass felt.  No neck rigidity. Respiratory: No rhonchi or crepitations. Cardiovascular: S1-S2 heard. Abdomen: Soft nontender bowel sound present. Musculoskeletal: No edema.  Right upper extremity has a splint for the cardiac cath access. Skin: No rash. Neurologic: Alert awake oriented to time place and person.  Moves all extremities 5 x 5.  No facial asymmetry tongue is midline pupils equal reacting to light. Psychiatric: Appears normal.  Normal affect.   Labs on Admission: I have personally reviewed following labs and imaging studies  CBC: Recent Labs  Lab 05/16/21 2005 05/16/21 2013  WBC 5.7  --   NEUTROABS 3.5  --   HGB 13.1 13.3  HCT 37.8 39.0  MCV 86.9  --   PLT 224  --    Basic Metabolic Panel: Recent Labs  Lab 05/16/21 2005 05/16/21 2013  NA 138 141  K 3.7 3.7  CL 104 104  CO2 25  --   GLUCOSE 149* 142*  BUN 10 11  CREATININE 0.81 0.80  CALCIUM 9.1  --    GFR: Estimated Creatinine Clearance: 89.1 mL/min (by C-G formula based on SCr of 0.8 mg/dL). Liver Function Tests: Recent Labs  Lab 05/16/21 2005  AST 29  ALT 36  ALKPHOS 68  BILITOT 0.3  PROT 7.3  ALBUMIN 4.0   No results for input(s): LIPASE, AMYLASE in the last 168 hours. No results for input(s): AMMONIA in the last 168 hours. Coagulation Profile: Recent Labs  Lab 05/16/21 2005  INR 1.0   Cardiac Enzymes: No results for input(s): CKTOTAL, CKMB, CKMBINDEX, TROPONINI in the last 168 hours. BNP (last 3 results) No results for input(s): PROBNP in the last 8760 hours. HbA1C: No  results for input(s): HGBA1C in the last 72 hours. CBG: Recent Labs  Lab 05/16/21 0811 05/16/21 2000 05/16/21 2126  GLUCAP 163* 135* 150*   Lipid Profile: No results for input(s): CHOL, HDL, LDLCALC, TRIG, CHOLHDL, LDLDIRECT in the last 72 hours. Thyroid Function Tests: No results for input(s): TSH, T4TOTAL, FREET4, T3FREE, THYROIDAB in the last 72 hours. Anemia Panel: No results for input(s): VITAMINB12, FOLATE, FERRITIN, TIBC, IRON, RETICCTPCT in the last 72 hours. Urine analysis:    Component Value Date/Time   COLORURINE YELLOW 05/16/2021 1936   APPEARANCEUR CLEAR 05/16/2021 1936   LABSPEC 1.029 05/16/2021 1936  PHURINE 7.0 05/16/2021 1936   GLUCOSEU NEGATIVE 05/16/2021 1936   HGBUR NEGATIVE 05/16/2021 1936   BILIRUBINUR NEGATIVE 05/16/2021 1936   KETONESUR NEGATIVE 05/16/2021 1936   PROTEINUR NEGATIVE 05/16/2021 1936   NITRITE NEGATIVE 05/16/2021 1936   LEUKOCYTESUR NEGATIVE 05/16/2021 1936   Sepsis Labs: _0 (procalcitonin:4,lacticidven:4) ) Recent Results (from the past 240 hour(s))  Resp Panel by RT-PCR (Flu A&B, Covid) Nasopharyngeal Swab     Status: None   Collection Time: 05/16/21  8:00 PM   Specimen: Nasopharyngeal Swab; Nasopharyngeal(NP) swabs in vial transport medium  Result Value Ref Range Status   SARS Coronavirus 2 by RT PCR NEGATIVE NEGATIVE Final    Comment: (NOTE) SARS-CoV-2 target nucleic acids are NOT DETECTED.  The SARS-CoV-2 RNA is generally detectable in upper respiratory specimens during the acute phase of infection. The lowest concentration of SARS-CoV-2 viral copies this assay can detect is 138 copies/mL. A negative result does not preclude SARS-Cov-2 infection and should not be used as the sole basis for treatment or other patient management decisions. A negative result may occur with  improper specimen collection/handling, submission of specimen other than nasopharyngeal swab, presence of viral mutation(s) within the areas  targeted by this assay, and inadequate number of viral copies(<138 copies/mL). A negative result must be combined with clinical observations, patient history, and epidemiological information. The expected result is Negative.  Fact Sheet for Patients:  EntrepreneurPulse.com.au  Fact Sheet for Healthcare Providers:  IncredibleEmployment.be  This test is no t yet approved or cleared by the [place] FDA and  has been authorized for detection and/or diagnosis of SARS-CoV-2 by FDA under an Emergency Use Authorization (EUA). This EUA will remain  in effect (meaning this test can be used) for the duration of the COVID-19 declaration under Section 564(b)(1) of the Act, 21 U.S.C.section 360bbb-3(b)(1), unless the authorization is terminated  or revoked sooner.       Influenza A by PCR NEGATIVE NEGATIVE Final   Influenza B by PCR NEGATIVE NEGATIVE Final    Comment: (NOTE) The Xpert Xpress SARS-CoV-2/FLU/RSV plus assay is intended as an aid in the diagnosis of influenza from Nasopharyngeal swab specimens and should not be used as a sole basis for treatment. Nasal washings and aspirates are unacceptable for Xpert Xpress SARS-CoV-2/FLU/RSV testing.  Fact Sheet for Patients: EntrepreneurPulse.com.au  Fact Sheet for Healthcare Providers: IncredibleEmployment.be  This test is not yet approved or cleared by the [place] FDA and has been authorized for detection and/or diagnosis of SARS-CoV-2 by FDA under an Emergency Use Authorization (EUA). This EUA will remain in effect (meaning this test can be used) for the duration of the COVID-19 declaration under Section 564(b)(1) of the Act, 21 U.S.C. section 360bbb-3(b)(1), unless the authorization is terminated or revoked.  Performed at [facility], [place] [address] [place], [place] [address]      Radiological Exams on Admission: MR BRAIN WO  CONTRAST  Result Date: 05/16/2021 CLINICAL DATA:  Facial droop EXAM: MRI HEAD WITHOUT CONTRAST TECHNIQUE: Multiplanar, multiecho pulse sequences of the brain and surrounding structures were obtained without intravenous contrast. COMPARISON:  None. FINDINGS: Brain: There is a punctate focus of diffusion restriction in the region of the left caudate body, possibly artifactual. Otherwise, no evidence of acute ischemia. No acute or chronic hemorrhage. Normal white matter signal, parenchymal volume and CSF spaces. The midline structures are normal. Vascular: Major flow voids are preserved. Skull and upper cervical spine: Normal calvarium and skull base. Visualized upper cervical spine and soft tissues are  normal. Sinuses/Orbits:No paranasal sinus fluid levels or advanced mucosal thickening. No mastoid or middle ear effusion. Normal orbits. IMPRESSION: Punctate focus of diffusion restriction in the region of the left caudate body, favored to be artifactual, particularly given that this location would not be causative of the reported symptoms. Otherwise normal brain. Electronically Signed   By: Ulyses Jarred M.D.   On: 05/16/2021 20:49   CARDIAC CATHETERIZATION  Addendum Date: 05/16/2021   LM: Normal LAD: 40% prox LAD, 50% prox diag disease         LAD wraps around apex and gives left-to-right collaterals to distal RCA Lcx: Prox focal 80% stenosis        Left-to-right collaterals to distal RCA RCA: Prox subtotal occlusion, followed by mid 100% occlusion         Collateralized up to mid RCA by LAD and Lcx Successful OCT guided percutaneous coronary intervention prox Lcx        PTCA and stent placement 2.0 X 15 mm Onyx Frontier drug-eluting stent        Post dilatation using 2.25 mm balloon at 16 atm        80%---0% residual stenosis        112% stent expansion, MSA 3.6 mm2 Nigel Mormon, MD Pager: [phone] Office: [phone]   Result Date: 05/16/2021 Images from the original result were not included. LM:  Normal LAD: 40% prox LAD, 50% prox diag disease         LAD wraps around apex and gives left-to-right collaterals to distal RCA Lcx: Prox focal 80% stenosis        Left-to-right collaterals to distal RCA RCA: Prox subtotal occlusion, followed by mid 100% occlusion         Collateralized up to mid RCA by LAD and Lcx Successful OCT guided percutaneous coronary intervention prox Lcx        PTCA and stent placement 2.0 X 15 mm Onyx Frontier drug-eluting stent        Post dilatation using 2.25 mm balloon at 16 atm        80%---0% residual stenosis        112% stent expansion, MSA 3.6 mm2 Nigel Mormon, MD Pager: [phone] Office: [phone]  CT HEAD CODE STROKE WO CONTRAST  Result Date: 05/16/2021 CLINICAL DATA:  Code stroke.  Left facial droop and arm weakness EXAM: CT HEAD WITHOUT CONTRAST TECHNIQUE: Contiguous axial images were obtained from the base of the skull through the vertex without intravenous contrast. COMPARISON:  03/23/2021 FINDINGS: Brain: There is no mass, hemorrhage or extra-axial collection. The size and configuration of the ventricles and extra-axial CSF spaces are normal. The brain parenchyma is normal, without evidence of acute or chronic infarction. Vascular: No abnormal hyperdensity of the major intracranial arteries or dural venous sinuses. No intracranial atherosclerosis. Skull: The visualized skull base, calvarium and extracranial soft tissues are normal. Sinuses/Orbits: No fluid levels or advanced mucosal thickening of the visualized paranasal sinuses. No mastoid or middle ear effusion. The orbits are normal. ASPECTS [facility] Stroke Program Early CT Score) - Ganglionic level infarction (caudate, lentiform nuclei, internal capsule, insula, M1-M3 cortex): 7 - Supraganglionic infarction (M4-M6 cortex): 3 Total score (0-10 with 10 being normal): 10 IMPRESSION: 1. Normal head CT. 2. ASPECTS is 10. These results were communicated to Dr. Roland Rack at 8:17 pm on 05/16/2021 by  text page via the [facility] messaging system. Electronically Signed   By: Ulyses Jarred M.D.   On: 05/16/2021 20:17   CT ANGIO  HEAD NECK W WO CM (CODE STROKE)  Result Date: 05/16/2021 CLINICAL DATA:  Stroke follow-up EXAM: CT ANGIOGRAPHY HEAD AND NECK TECHNIQUE: Multidetector CT imaging of the head and neck was performed using the standard protocol during bolus administration of intravenous contrast. Multiplanar CT image reconstructions and MIPs were obtained to evaluate the vascular anatomy. Carotid stenosis measurements (when applicable) are obtained utilizing NASCET criteria, using the distal internal carotid diameter as the denominator. CONTRAST:  66m OMNIPAQUE IOHEXOL 350 MG/ML SOLN COMPARISON:  None. FINDINGS: CTA NECK FINDINGS SKELETON: There is no bony spinal canal stenosis. No lytic or blastic lesion. OTHER NECK: Normal pharynx, larynx and major salivary glands. No cervical lymphadenopathy. Unremarkable thyroid gland. UPPER CHEST: No pneumothorax or pleural effusion. No nodules or masses. AORTIC ARCH: There is no calcific atherosclerosis of the aortic arch. There is no aneurysm, dissection or hemodynamically significant stenosis of the visualized portion of the aorta. Conventional 3 vessel aortic branching pattern. The visualized proximal subclavian arteries are widely patent. RIGHT CAROTID SYSTEM: No dissection, occlusion or aneurysm. Mild atherosclerotic calcification at the carotid bifurcation without hemodynamically significant stenosis. LEFT CAROTID SYSTEM: Normal without aneurysm, dissection or stenosis. VERTEBRAL ARTERIES: Left dominant configuration. Both origins are clearly patent. There is no dissection, occlusion or flow-limiting stenosis to the skull base (V1-V3 segments). CTA HEAD FINDINGS POSTERIOR CIRCULATION: --Vertebral arteries: Normal V4 segments. --Inferior cerebellar arteries: Normal. --Basilar artery: Normal. --Superior cerebellar arteries: Normal. --Posterior cerebral arteries (PCA):  Multifocal moderate-to-severe narrowing of the right PCA with short segment occlusion of the P3 segment. Normal left PCA. ANTERIOR CIRCULATION: --Intracranial internal carotid arteries: Atherosclerotic calcification of the internal carotid arteries at the skull base without hemodynamically significant stenosis. --Anterior cerebral arteries (ACA): Normal. Both A1 segments are present. Patent anterior communicating artery (a-comm). --Middle cerebral arteries (MCA): Normal. VENOUS SINUSES: As permitted by contrast timing, patent. ANATOMIC VARIANTS: None Review of the MIP images confirms the above findings. IMPRESSION: 1. No emergent large vessel occlusion. 2. 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 3. Mild right carotid bifurcation atherosclerosis without hemodynamically significant stenosis by NASCET criteria. Electronically Signed   By: KUlyses JarredM.D.   On: 05/16/2021 20:46    EKG: Independently reviewed.  Normal sinus rhythm.  Assessment/Plan Principal Problem:   Acute CVA [facility]cerebrovascular accident) ([facility] Active Problems:   Essential hypertension   Coronary artery disease of native artery of native heart [place]   Statin intolerance    Acute CVA -discussed with neurologist Dr. KLeonel Ramsayat this time will be continuing aspirin Plavix patient is intolerant to statins.  Patient did pass stroke swallow.  CT angiogram of the head and neck did not show any large vessel obstruction.  We will keep patient on neurochecks check hemoglobin A1c lipid panel 2D echo physical therapy consult.  Further recommendations per neurology. Migraine headache which is resolved with migraine cocktail including Compazine and Benadryl.  Likely causing patient's symptoms.  We will closely monitor. CAD status post stenting yesterday denies any chest pain at this time.  Continue aspirin Plavix patient is intolerant  of statins.  Beta-blockers.  Holding Imdur to allow for permissive hypertension. Diabetes mellitus type 2 on diet we will keep patient on sliding scale coverage and hemoglobin A1c.   DVT prophylaxis: Lovenox. Code Status: Full code. Family Communication: Patient's daughter at the bedside. Disposition Plan: Home. Consults called: Neurology. Admission status: Observation.   ARise PatienceMD [facility] Pager [phone]  If 7PM-7AM, please contact night-coverage [email or web address] Password [facility]  05/17/2021, 12:48 AM

## 2021-05-17 NOTE — ED Notes (Signed)
Breakfast Orders placed 

## 2021-05-17 NOTE — ED Notes (Signed)
Pt able to ambulate independently to bathroom

## 2021-05-17 NOTE — Progress Notes (Signed)
55 y.o. African American female with hypertension, mixed hyperlipidemia, h/o stroke (2018), CAD with stable angina, s/p PCI to pro Lcx on 11/1 by me.  Patient was admitted overnight with left sided headache, numbness, and weakness lasting for an hour, with brain MRI showing only punctate focus of diffusion restriction in the region of the left caudate body, favored to be artifactual, particularly given that this location would not be causative of the reported symptoms. Otherwise normal brain. Reviewed Neurology notes as well. Appreciate their input.   Overall, I agree that this was not a stroke, and more likely to be complicated migraine.  Continue DAPT with Aspirin and plavix given recent PCI. No change needed in Aspirin dose.  Patient feels well this morning and would like to go home. No cardiac reason for further hospital stay. Defer discharge time to primary team.   Outpatient follow up is scheduled with me.    Elder Negus, MD Pager: [phone] Office: [phone]

## 2021-05-17 NOTE — Progress Notes (Signed)
PT Cancellation Note  Patient Details Name: Veronica Lynch MRN: [id number] DOB: 25-Aug-1965   Cancelled Treatment:    Reason Eval/Treat Not Completed: PT screened, no needs identified, will sign off; NT in the room and reports pt just back from ambulating to the bathroom with steady gait.  Patient feels mild weakness, but mostly back to baseline.  Son present and can assist if needed.  PT to sign off.    Elray Mcgregor 05/17/2021, 10:09 AM Sheran Lawless, PT Acute Rehabilitation Services [phone] Office:[phone] 05/17/2021

## 2021-05-24 ENCOUNTER — Other Ambulatory Visit: Payer: Self-pay

## 2021-05-24 ENCOUNTER — Ambulatory Visit (INDEPENDENT_AMBULATORY_CARE_PROVIDER_SITE_OTHER): Payer: BC Managed Care – PPO | Admitting: Nurse Practitioner

## 2021-05-24 ENCOUNTER — Encounter: Payer: Self-pay | Admitting: Nurse Practitioner

## 2021-05-24 VITALS — BP 138/90 | HR 85 | Temp 98.7°F | Ht 66.0 in | Wt 194.8 lb

## 2021-05-24 DIAGNOSIS — Z1231 Encounter for screening mammogram for malignant neoplasm of breast: Secondary | ICD-10-CM | POA: Diagnosis not present

## 2021-05-24 DIAGNOSIS — I25118 Atherosclerotic heart disease of native coronary artery with other forms of angina pectoris: Secondary | ICD-10-CM

## 2021-05-24 DIAGNOSIS — Z1211 Encounter for screening for malignant neoplasm of colon: Secondary | ICD-10-CM

## 2021-05-24 DIAGNOSIS — R519 Headache, unspecified: Secondary | ICD-10-CM

## 2021-05-24 DIAGNOSIS — Z2821 Immunization not carried out because of patient refusal: Secondary | ICD-10-CM | POA: Diagnosis not present

## 2021-05-24 NOTE — Progress Notes (Signed)
I,Jameka J Llittleton,acting as a Neurosurgeon for [employer], NP.,have documented all relevant documentation on the behalf of [employer], NP,as directed by  Charlesetta Ivory, NP while in the presence of Charlesetta Ivory, NP.  This visit occurred during the SARS-CoV-2 public health emergency.  Safety protocols were in place, including screening questions prior to the visit, additional usage of staff PPE, and extensive cleaning of exam room while observing appropriate contact time as indicated for disinfecting solutions.  Subjective:     Patient ID: Veronica Lynch , female    DOB: 1966/02/26 , 55 y.o.   MRN: [id number]   Chief Complaint  Patient presents with   HOSPITAL F/U    HPI  Patient presents today for a hospital f/u . She was having a pain in the left side of her head and she had severe weakness. Overall she is feeling a lot better after her stent placement. She is followed by cardiology. She refuses the flu vaccine today. She would like to get her colon cancer screening and mammogram screening. She is eating healthier and avoiding fats, carbs and sweets. She is also walking. Overall she is feeling good.     Past Medical History:  Diagnosis Date   CVA (cerebral vascular accident) (HCC) 2018   CVA (cerebral vascular accident) (HCC) 2017   Diabetes (HCC)    Hyperlipidemia    Hypertension    Shingles      Family History  Problem Relation Age of Onset   Hypertension Mother    Hyperlipidemia Mother    Osteoporosis Mother    Cancer Father    Sarcoidosis Sister    Diabetes Maternal Aunt      Current Outpatient Medications:    Alirocumab (PRALUENT) 75 MG/ML SOAJ, Inject 1 mL into the skin every 14 (fourteen) days., Disp: 2 mL, Rfl: 3   Ascorbic Acid (VITAMIN C) 1000 MG tablet, Take 1,000 mg by mouth daily., Disp: , Rfl:    aspirin EC 81 MG tablet, Take 1 tablet (81 mg total) by mouth daily. Swallow whole., Disp: 90 tablet, Rfl: 3   Biotin 10 MG TABS, Take 10 mg by  mouth daily., Disp: , Rfl:    clopidogrel (PLAVIX) 75 MG tablet, Take 1 tablet (75 mg total) by mouth daily., Disp: 30 tablet, Rfl: 2   isosorbide mononitrate (IMDUR) 30 MG 24 hr tablet, Take 1 tablet (30 mg total) by mouth daily., Disp: 30 tablet, Rfl: 3   metoprolol succinate (TOPROL-XL) 50 MG 24 hr tablet, Take 1 tablet (50 mg total) by mouth daily. Take with or immediately following a meal., Disp: 30 tablet, Rfl: 3   nitroGLYCERIN (NITROSTAT) 0.4 MG SL tablet, Place 0.4 mg under the tongue every 5 (five) minutes as needed for chest pain., Disp: , Rfl:    TRUE METRIX BLOOD GLUCOSE TEST test strip, Check blood sugar once daily, Disp: , Rfl:    Allergies  Allergen Reactions   Bactrim [Sulfamethoxazole-Trimethoprim] Hives and Itching   Codeine Other (See Comments)    Pass out      Review of Systems  Constitutional:  Negative for chills, fatigue and fever.  HENT:  Negative for congestion, sinus pressure and sinus pain.   Respiratory:  Negative for cough, shortness of breath and wheezing.   Cardiovascular:  Negative for chest pain and palpitations.  Musculoskeletal:  Negative for arthralgias and myalgias.  Neurological:  Negative for dizziness, weakness, numbness and headaches.    Today's Vitals   05/24/21 0826  Temp: 98.7 F (  37.1 C)  Weight: 194 lb 12.8 oz (88.4 kg)  Height: 5\' 6"  (1.676 m)  PainSc: 0-No pain   Body mass index is 31.44 kg/m.   Objective:  Physical Exam Constitutional:      Appearance: Normal appearance. She is obese.  HENT:     Head: Normocephalic and atraumatic.     Nose: No congestion or rhinorrhea.  Cardiovascular:     Rate and Rhythm: Normal rate and regular rhythm.     Pulses: Normal pulses.     Heart sounds: Normal heart sounds. No murmur heard. Pulmonary:     Effort: Pulmonary effort is normal. No respiratory distress.     Breath sounds: Normal breath sounds. No wheezing.  Skin:    General: Skin is warm and dry.     Capillary Refill: Capillary  refill takes less than 2 seconds.  Neurological:     Mental Status: She is alert and oriented to person, place, and time.        Assessment And Plan:     1. Acute noncontractable headache, unspecified headache type  -She was recently seen in the ED for a headache. Stroke was ruled out. She also had cardiac catheterization earlier that day.  -currently reports of no symptoms of headache or weakness. No difficulty with vision or speech.   2. Herpes zoster vaccination declined -Education give about the Herpes zoster vaccine.   3. Influenza vaccination declined -Education give about the influenza vaccine   4. Tetanus, diphtheria, and acellular pertussis (Tdap) vaccination declined -Education given about the TDAP vaccine   5. Pneumococcal vaccination declined -Education give about pneumococcal vaccine.   6. Screening mammogram for breast cancer - MM Digital Screening; Future  7. Screening for colon cancer - Ambulatory referral to Gastroenterology   The patient was encouraged to call or send a message through MyChart for any questions or concerns.   Follow up: if symptoms persist or do not get better.   Side effects and appropriate use of all the medication(s) were discussed with the patient today. Patient advised to use the medication(s) as directed by their healthcare provider. The patient was encouraged to read, review, and understand all associated package inserts and contact our office with any questions or concerns. The patient accepts the risks of the treatment plan and had an opportunity to ask questions.   Staying healthy and adopting a healthy lifestyle for your overall health is important. You should eat 7 or more servings of fruits and vegetables per day. You should drink plenty of water to keep yourself hydrated and your kidneys healthy. This includes about 65-80+ fluid ounces of water. Limit your intake of animal fats especially for elevated cholesterol. Avoid highly  processed food and limit your salt intake if you have hypertension. Avoid foods high in saturated/Trans fats. Along with a healthy diet it is also very important to maintain time for yourself to maintain a healthy mental health with low stress levels. You should get atleast 150 min of moderate intensity exercise weekly for a healthy heart. Along with eating right and exercising, aim for at least 7-9 hours of sleep daily.  Eat more whole grains which includes barley, wheat berries, oats, brown rice and whole wheat pasta. Use healthy plant oils which include olive, soy, corn, sunflower and peanut. Limit your caffeine and sugary drinks. Limit your intake of fast foods. Limit milk and dairy products to one or two daily servings.   Patient was given opportunity to ask questions. Patient verbalized understanding  of the plan and was able to repeat key elements of the plan. All questions were answered to their satisfaction.  Veronica Nelly Scriven, DNP   I, Veronica Lynch have reviewed all documentation for this visit. The documentation on 05/24/21 for the exam, diagnosis, procedures, and orders are all accurate and complete.    IF YOU HAVE BEEN REFERRED TO A SPECIALIST, IT MAY TAKE 1-2 WEEKS TO SCHEDULE/PROCESS THE REFERRAL. IF YOU HAVE NOT HEARD FROM US/SPECIALIST IN TWO WEEKS, PLEASE GIVE [place] A CALL AT [phone] X [phone].   THE PATIENT IS ENCOURAGED TO PRACTICE SOCIAL DISTANCING DUE TO THE COVID-19 PANDEMIC.

## 2021-05-30 ENCOUNTER — Other Ambulatory Visit (HOSPITAL_COMMUNITY): Payer: Self-pay

## 2021-05-31 ENCOUNTER — Telehealth: Payer: Self-pay | Admitting: Pharmacist

## 2021-05-31 NOTE — Telephone Encounter (Signed)
Pharmacy Transitions of Care Follow-up Telephone Call  Date of discharge: 05/16/21  Discharge Diagnosis: Had surgery with dx of CAD.  Medication changes made at discharge:  START taking: clopidogrel (Plavix)  Medication changes verified by the patient? Yes    Medication Accessibility:  [facility] [place], [place]  Was the patient provided with refills on discharged medications? Yes,  2 refills  Have all prescriptions been transferred from [facility] to home pharmacy? N/A   Medication Review:  CLOPIDOGREL (PLAVIX) Clopidogrel 75 mg once daily.  - Educated patient on expected duration of therapy of with clopidogrel. Advised patient that aspirin will be continued indefinitely.  - Reviewed potential DDIs with patient  - Advised patient of medications to avoid (NSAIDs, ASA)  - Educated that Tylenol (acetaminophen) will be the preferred analgesic to prevent risk of bleeding  - Emphasized importance of monitoring for signs and symptoms of bleeding (abnormal bruising, prolonged bleeding, nose bleeds, bleeding from gums, discolored urine, black tarry stools)  - Advised patient to alert all providers of anticoagulation therapy prior to starting a new medication or having a procedure   Follow-up Appointments:  NOV 9 Physical with Charlesetta Ivory, NP Wednesday May 24, 2021 8:45 AM Patient should bring all necessary paperwork to be completed. Arrive 15 minutes prior to the appointment. [facility] [address] [place] [address] [place] [address] [phone] NOV [phone] Office Visit with Elder Negus, MD Thursday Jun 01, 2021 1:00 PM (Arrive by 12:45 PM) Please arrive 15 minutes prior to your appointment. This will allow [place] to verify and update your medical record and ensure a full appointment for you within the time allotted. [facility] [address] [place] [address] [phone]  Final Patient  Assessment: Pt is doing well on medication. Taking correctly. Has noticed increase in bruising, did not realize that could be from medication. Went over SEs of medication and how to avoid them. Pt was understanding and plans to attend appt with cardiologist tomorrow 06/01/21.

## 2021-06-01 ENCOUNTER — Ambulatory Visit: Payer: BC Managed Care – PPO | Admitting: Cardiology

## 2021-06-01 ENCOUNTER — Encounter: Payer: Self-pay | Admitting: Cardiology

## 2021-06-01 ENCOUNTER — Other Ambulatory Visit: Payer: Self-pay

## 2021-06-01 VITALS — BP 120/80 | HR 78 | Temp 98.0°F | Ht 66.0 in | Wt 203.0 lb

## 2021-06-01 DIAGNOSIS — M609 Myositis, unspecified: Secondary | ICD-10-CM

## 2021-06-01 DIAGNOSIS — Z789 Other specified health status: Secondary | ICD-10-CM

## 2021-06-01 DIAGNOSIS — I10 Essential (primary) hypertension: Secondary | ICD-10-CM

## 2021-06-01 DIAGNOSIS — E782 Mixed hyperlipidemia: Secondary | ICD-10-CM

## 2021-06-01 DIAGNOSIS — I25118 Atherosclerotic heart disease of native coronary artery with other forms of angina pectoris: Secondary | ICD-10-CM

## 2021-06-01 MED ORDER — ASPIRIN EC 81 MG PO TBEC: 81.0000 mg | DELAYED_RELEASE_TABLET | Freq: Every day | ORAL | 3 refills | Status: DC

## 2021-06-01 MED ORDER — CLOPIDOGREL BISULFATE 75 MG PO TABS: 75.0000 mg | ORAL_TABLET | Freq: Every day | ORAL | 3 refills | Status: DC

## 2021-06-01 NOTE — Progress Notes (Signed)
Patient referred by Glendale Chard, MD for hypertension  Subjective:   Veronica Lynch, female    DOB: 22-May-1966, 55 y.o.   MRN: [id number]   Chief Complaint  Patient presents with   Coronary Artery Disease   Results   Hospitalization Follow-up     55 y.o. African American female with hypertension, mixed hyperlipidemia, h/o stroke (2018), CAD with stable angina  Patient has had significant improvement post pLCX PCI. She does have RCA CTO, with left-to-right collaterals. She has had only one episode of chest pan since then.   Patient was admitted overnight day after PCI, with left sided headache, numbness, and weakness lasting for an hour, with brain MRI showing only punctate focus of diffusion restriction in the region of the left caudate body, favored to be artifactual, particularly given that this location would not be causative of the reported symptoms. Otherwise normal brain. Reviewed Neurology notes as well. Appreciate their input.    Overall, I agreed that this was not a stroke, and more likely to be complicated migraine.     Initial consultation HPI 11/2020: Patient works as an Web designer at [employer].  She moved from [place] to [place].  Prior to that, she lived in [place].  In 2017, patient had a stroke, fortunately with no significant residual deficit, following a lap band procedure. She was admitted at [facility] and then transferred to [facility] at [facility]  More recently, she is experienced retrosternal chest pain and shortness of breath with walking.  She used to walk up to 10 miles a week, but this is down significantly in the last few months due to the above symptoms.  She denies any presyncope syncope, orthopnea, PND.  She has occasional leg edema.  Her sister has noticed that patient's heart rate has been irregular.  Patient had an EKG performed at her PCP office, copy of which is not legible.  I will  request another copy.     Current Outpatient Medications on File Prior to Visit  Medication Sig Dispense Refill   Alirocumab (PRALUENT) 75 MG/ML SOAJ Inject 1 mL into the skin every 14 (fourteen) days. 2 mL 3   Ascorbic Acid (VITAMIN C) 1000 MG tablet Take 1,000 mg by mouth daily.     aspirin EC 81 MG tablet Take 1 tablet (81 mg total) by mouth daily. Swallow whole. 90 tablet 3   Biotin 10 MG TABS Take 10 mg by mouth daily.     clopidogrel (PLAVIX) 75 MG tablet Take 1 tablet (75 mg total) by mouth daily. 30 tablet 2   isosorbide mononitrate (IMDUR) 30 MG 24 hr tablet Take 1 tablet (30 mg total) by mouth daily. 30 tablet 3   metoprolol succinate (TOPROL-XL) 50 MG 24 hr tablet Take 1 tablet (50 mg total) by mouth daily. Take with or immediately following a meal. 30 tablet 3   nitroGLYCERIN (NITROSTAT) 0.4 MG SL tablet Place 0.4 mg under the tongue every 5 (five) minutes as needed for chest pain.     TRUE METRIX BLOOD GLUCOSE TEST test strip Check blood sugar once daily     No current facility-administered medications on file prior to visit.    Cardiovascular and other pertinent studies:  EKG 06/01/2021: Sinus rhythm 77 bpm Nonspecific T-abnormality  Coronary intervention 05/16/2021: LM: Normal LAD: 40% prox LAD, 50% prox diag disease         LAD wraps around apex and gives left-to-right collaterals  to distal RCA Lcx: Prox focal 80% stenosis        Left-to-right collaterals to distal RCA RCA: Prox subtotal occlusion, followed by mid 100% occlusion         Collateralized up to mid RCA by LAD and Lcx   Successful OCT guided percutaneous coronary intervention prox Lcx        PTCA and stent placement 2.0 X 15 mm Onyx Frontier drug-eluting stent        Post dilatation using 2.25 mm balloon at 16 atm        80%---0% residual stenosis        112% stent expansion, MSA 3.6 mm2  CT Cardiac scoring 11/28/2020: LM: 0 LAD: 11 LCx: 10 RCA: 0 Total score: 21 (88th percentile)  Mobile  cardiac telemetry 5 days 11/23/2020 - 11/28/2020: Dominant rhythm: Sinus. HR 60-135 bpm. Avg HR 91 bpm. 0 episodes of SVT 3.3% isolated SVE, 1.7% couplets, <1%triplets. 0 episodes of VT <% isolated VE, no couplet/triplets. No atrial fibrillation/atrial flutter/SVT/VT/high grade AV block, sinus pause >3sec noted. 0 patient triggered events.   Echocardiogram 11/23/2020:  Normal LV systolic function with visual EF 60-65%. Left ventricle cavity  is normal in size. Normal global wall motion. Normal diastolic filling  pattern, normal LAP.  Trace tricuspid regurgitation. No evidence of pulmonary hypertension.  No prior study for comparison.  Exercise Sestamibi stress test 11/21/2020: Exercise nuclear stress test was performed using Bruce protocol. Patient reached 7.3 METS, and 82% of age predicted maximum heart rate. Exercise capacity was low. No chest pain reported. Exertional dyspnea and dizziness reported. Heart rate and hemodynamic response were normal. Stress EKG revealed no ischemic changes. SPECT images showed small size, mild intensity, reversible perfusion defect in apical to basal, inferior myocardium.  Stress LVEF 62%. Low risk study.  11/2016 Acute CVA s/p TPA   MRI brain 11/2016:  Normal   CTA head/neck 11/2016: No carotid stenosis Small thrombus noted in Rt M2 segment, too small for safe retrieval   Echocardiogram 11/2016: Normal LV size. LVEF 55-65%. Mild LVH. Normal diastolic function.    Recent labs: 10/22/2020: Glucose 113, BUN/Cr 11/0.70. EGFR 98. Na/K 141/4.1. Rest of the CMP normal H/H 12.5/37.8 MCV 85.7. Platelets 246 HbA1C 7.0% Chol 247, TG 184, HDL 57, LDL 156 TSH 0.92 normal    Review of Systems  Constitutional: Positive for malaise/fatigue.  Cardiovascular:  Positive for chest pain (Only one episode). Negative for dyspnea on exertion, leg swelling, palpitations and syncope.        Vitals:   06/01/21 1239  BP: 120/80  Pulse: 78  Temp: 98 F (36.7 C)   SpO2: 98%     Body mass index is 32.77 kg/m. Filed Weights   06/01/21 1239  Weight: 203 lb (92.1 kg)     Objective:   Physical Exam Vitals and nursing note reviewed.  Constitutional:      General: She is not in acute distress. Neck:     Vascular: No JVD.  Cardiovascular:     Rate and Rhythm: Normal rate and regular rhythm.     Pulses: Normal pulses.     Heart sounds: Normal heart sounds. No murmur heard. Pulmonary:     Effort: Pulmonary effort is normal.     Breath sounds: Normal breath sounds. No wheezing or rales.  Musculoskeletal:     Right lower leg: No edema.     Left lower leg: No edema.        Assessment & Recommendations:   55  y.o. [place] American female with hypertension, mixed hyperlipidemia, h/o stroke (2018), CAD s/p pLCx PCI, known RCA CTO  CAD: RCA CTO. Successful pLCx PCI (04/2021) Recommend DAPT with Aspirin and plavix for 6 months Continue Praluent. Known statin myalgias. LDL down to 72. Okay to stop Imdur 30 mg daily. Continue metoprolol succinate to 50 mg daily.  Hypertension: Controlled  Mixed hyperlipidemia: Management as above  F/u in 6 months   Nigel Mormon, MD Pager: [phone] Office: [phone]

## 2021-06-28 ENCOUNTER — Other Ambulatory Visit: Payer: Self-pay | Admitting: Cardiology

## 2021-06-28 ENCOUNTER — Inpatient Hospital Stay: Payer: BC Managed Care – PPO | Admitting: Adult Health

## 2021-06-28 DIAGNOSIS — I25118 Atherosclerotic heart disease of native coronary artery with other forms of angina pectoris: Secondary | ICD-10-CM

## 2021-07-19 ENCOUNTER — Ambulatory Visit: Payer: BC Managed Care – PPO

## 2021-08-29 ENCOUNTER — Encounter: Payer: Self-pay | Admitting: Gastroenterology

## 2021-08-30 ENCOUNTER — Other Ambulatory Visit: Payer: Self-pay | Admitting: Cardiology

## 2021-08-30 DIAGNOSIS — I25118 Atherosclerotic heart disease of native coronary artery with other forms of angina pectoris: Secondary | ICD-10-CM

## 2021-09-19 ENCOUNTER — Ambulatory Visit: Payer: BC Managed Care – PPO | Admitting: Gastroenterology

## 2021-09-19 ENCOUNTER — Telehealth: Payer: Self-pay | Admitting: Gastroenterology

## 2021-09-19 NOTE — Telephone Encounter (Signed)
Good Morning Dr. Tomasa Rand, ? ? ?Patient called to cancel appointment with you today at 2:10 due to transportation issues.  ? ? ?Patient was reschedule for 3/28 at 2:10.  ?

## 2021-09-26 ENCOUNTER — Encounter: Payer: Self-pay | Admitting: Nurse Practitioner

## 2021-09-26 ENCOUNTER — Other Ambulatory Visit: Payer: Self-pay

## 2021-09-26 ENCOUNTER — Ambulatory Visit: Payer: BC Managed Care – PPO | Admitting: Nurse Practitioner

## 2021-09-26 VITALS — BP 124/82 | HR 81 | Temp 98.0°F | Ht 66.0 in | Wt 207.2 lb

## 2021-09-26 DIAGNOSIS — Z2821 Immunization not carried out because of patient refusal: Secondary | ICD-10-CM

## 2021-09-26 DIAGNOSIS — R21 Rash and other nonspecific skin eruption: Secondary | ICD-10-CM

## 2021-09-26 DIAGNOSIS — Z6833 Body mass index (BMI) 33.0-33.9, adult: Secondary | ICD-10-CM

## 2021-09-26 DIAGNOSIS — E6609 Other obesity due to excess calories: Secondary | ICD-10-CM

## 2021-09-26 DIAGNOSIS — L299 Pruritus, unspecified: Secondary | ICD-10-CM

## 2021-09-26 MED ORDER — HYDROXYZINE HCL 10 MG PO TABS: 10.0000 mg | ORAL_TABLET | Freq: Three times a day (TID) | ORAL | 0 refills | Status: DC | PRN

## 2021-09-26 MED ORDER — TRIAMCINOLONE ACETONIDE 40 MG/ML IJ SUSP
60.0000 mg | Freq: Once | INTRAMUSCULAR | Status: AC
  Administered 2021-09-26: 60 mg via INTRAMUSCULAR

## 2021-09-26 NOTE — Progress Notes (Signed)
?This visit occurred during the SARS-CoV-2 public health emergency.  Safety protocols were in place, including screening questions prior to the visit, additional usage of staff PPE, and extensive cleaning of exam room while observing appropriate contact time as indicated for disinfecting solutions. ? ?Subjective:  ?  ? Patient ID: Veronica Lynch , female    DOB: 1965/10/27 , 56 y.o.   MRN: [id number] ? ? ?Chief Complaint  ?Patient presents with  ? Rash  ? ? ?HPI ? ?Pt presents today for rash on her lower back and it is spreading to her sides. She switched her fabric softner 2 weeks ago and now has a rash to her back. She thought it was a shingles outbreak.   ? ?Rash ?This is a new problem. The current episode started 1 to 4 weeks ago (2 weeks ago). The affected locations include the back. Past treatments include nothing.   ? ?Past Medical History:  ?Diagnosis Date  ? CVA (cerebral vascular accident) (HCC) 2018  ? CVA (cerebral vascular accident) (HCC) 2017  ? Diabetes (HCC)   ? Hyperlipidemia   ? Hypertension   ? Shingles   ?  ? ?Family History  ?Problem Relation Age of Onset  ? Hypertension Mother   ? Hyperlipidemia Mother   ? Osteoporosis Mother   ? Cancer Father   ? Sarcoidosis Sister   ? Diabetes Maternal Aunt   ? ? ? ?Current Outpatient Medications:  ?  Alirocumab (PRALUENT) 75 MG/ML SOAJ, Inject 1 mL into the skin every 14 (fourteen) days., Disp: 2 mL, Rfl: 3 ?  Ascorbic Acid (VITAMIN C) 1000 MG tablet, Take 1,000 mg by mouth daily., Disp: , Rfl:  ?  aspirin EC 81 MG tablet, Take 1 tablet (81 mg total) by mouth daily. Swallow whole., Disp: 90 tablet, Rfl: 3 ?  clopidogrel (PLAVIX) 75 MG tablet, Take 1 tablet (75 mg total) by mouth daily., Disp: 90 tablet, Rfl: 3 ?  hydrOXYzine (ATARAX) 10 MG tablet, Take 1 tablet (10 mg total) by mouth 3 (three) times daily as needed., Disp: 30 tablet, Rfl: 0 ?  metoprolol succinate (TOPROL-XL) 50 MG 24 hr tablet, TAKE 1 TABLET BY MOUTH DAILY. TAKE WITH OR IMMEDIATELY  FOLLOWING A MEAL., Disp: 90 tablet, Rfl: 1 ?  nitroGLYCERIN (NITROSTAT) 0.4 MG SL tablet, Place 0.4 mg under the tongue every 5 (five) minutes as needed for chest pain., Disp: , Rfl:  ?  TRUE METRIX BLOOD GLUCOSE TEST test strip, Check blood sugar once daily, Disp: , Rfl:  ?  Biotin 10 MG TABS, Take 10 mg by mouth daily. (Patient not taking: Reported on 09/26/2021), Disp: , Rfl:   ? ?Allergies  ?Allergen Reactions  ? Bactrim [Sulfamethoxazole-Trimethoprim] Hives and Itching  ? Codeine Other (See Comments)  ?  Pass out   ?  ? ?Review of Systems  ?Constitutional: Negative.   ?Respiratory: Negative.    ?Cardiovascular: Negative.   ?Skin:  Positive for rash.  ?Neurological: Negative.   ?Psychiatric/Behavioral: Negative.     ? ?Today's Vitals  ? 09/26/21 1100  ?BP: 124/82  ?Pulse: 81  ?Temp: 98 ?F (36.7 ?C)  ?Weight: 207 lb 3.2 oz (94 kg)  ?Height: 5\' 6"  (1.676 m)  ? ?Body mass index is 33.44 kg/m?.  ?Wt Readings from Last 3 Encounters:  ?09/26/21 207 lb 3.2 oz (94 kg)  ?06/01/21 203 lb (92.1 kg)  ?05/24/21 194 lb 12.8 oz (88.4 kg)  ?  ?BP Readings from Last 3 Encounters:  ?09/26/21 124/82  ?06/01/21  120/80  ?05/24/21 138/90  ?  ?Objective:  ?Physical Exam ?Vitals reviewed.  ?Constitutional:   ?   General: She is not in acute distress. ?   Appearance: Normal appearance. She is well-developed. She is obese.  ?HENT:  ?   Head: Normocephalic and atraumatic.  ?Cardiovascular:  ?   Rate and Rhythm: Normal rate and regular rhythm.  ?   Pulses: Normal pulses.  ?   Heart sounds: Normal heart sounds. No murmur heard. ?Pulmonary:  ?   Effort: Pulmonary effort is normal. No respiratory distress.  ?   Breath sounds: Normal breath sounds. No wheezing.  ?Skin: ?   General: Skin is warm and dry.  ?   Capillary Refill: Capillary refill takes less than 2 seconds.  ?   Findings: No rash.  ?   Comments: She does have healing closed areas to sacral area  ?Neurological:  ?   General: No focal deficit present.  ?   Mental Status: She is alert  and oriented to person, place, and time.  ?   Cranial Nerves: No cranial nerve deficit.  ?Psychiatric:     ?   Mood and Affect: Mood normal.     ?   Behavior: Behavior normal.     ?   Thought Content: Thought content normal.     ?   Judgment: Judgment normal.  ?  ? ?   ?Assessment And Plan:  ?   ?1. Rash ?Comments: No rash present where she is having the itching, will treat with Kenalog  ?- hydrOXYzine (ATARAX) 10 MG tablet; Take 1 tablet (10 mg total) by mouth 3 (three) times daily as needed.  Dispense: 30 tablet; Refill: 0 ?- triamcinolone acetonide (KENALOG-40) injection 60 mg ? ?2. Pruritus ?Comments: Rx for vistaril sent to pharmacy ?- hydrOXYzine (ATARAX) 10 MG tablet; Take 1 tablet (10 mg total) by mouth 3 (three) times daily as needed.  Dispense: 30 tablet; Refill: 0 ?- triamcinolone acetonide (KENALOG-40) injection 60 mg ? ?3. COVID-19 vaccination declined ?Declines covid 19 vaccine. Discussed risk of covid 66 and if she changes her mind about the vaccine to call the office.  Encouraged to take multivitamin, vitamin d, vitamin c and zinc to increase immune system. Aware can call office if would like to have vaccine here at office.  ? ?4. Varicella zoster virus (VZV) vaccination declined ?Declines shingrix, educated on disease process and is aware if he changes his mind to notify office ? ?5. Class 1 obesity due to excess calories with body mass index (BMI) of 33.0 to 33.9 in adult, unspecified whether serious comorbidity present ?She is encouraged to strive for BMI less than 30 to decrease cardiac risk. Advised to aim for at least 150 minutes of exercise per week.  ? ? ?Patient was given opportunity to ask questions. Patient verbalized understanding of the plan and was able to repeat key elements of the plan. All questions were answered to their satisfaction.  ?[name], FNP  ? ?I, [name], FNP, have reviewed all documentation for this visit. The documentation on 09/26/21 for the exam, diagnosis,  procedures, and orders are all accurate and complete.  ? ?IF YOU HAVE BEEN REFERRED TO A SPECIALIST, IT MAY TAKE 1-2 WEEKS TO SCHEDULE/PROCESS THE REFERRAL. IF YOU HAVE NOT HEARD FROM US/SPECIALIST IN TWO WEEKS, PLEASE GIVE [place] A CALL AT [phone] X [phone].  ? ?THE PATIENT IS ENCOURAGED TO PRACTICE SOCIAL DISTANCING DUE TO THE COVID-19 PANDEMIC.   ?

## 2021-09-28 ENCOUNTER — Encounter: Payer: BC Managed Care – PPO | Admitting: Nurse Practitioner

## 2021-10-10 ENCOUNTER — Ambulatory Visit: Payer: BC Managed Care – PPO | Admitting: Gastroenterology

## 2021-10-19 ENCOUNTER — Emergency Department
Admission: EM | Admit: 2021-10-19 | Discharge: 2021-10-19 | Disposition: A | Discharge status: Home / Self Care | Payer: BC Managed Care – PPO | Attending: Emergency Medicine | Admitting: Emergency Medicine

## 2021-10-19 ENCOUNTER — Other Ambulatory Visit: Payer: Self-pay

## 2021-10-19 DIAGNOSIS — H9201 Otalgia, right ear: Secondary | ICD-10-CM | POA: Diagnosis present

## 2021-10-19 DIAGNOSIS — H6991 Unspecified Eustachian tube disorder, right ear: Secondary | ICD-10-CM | POA: Diagnosis not present

## 2021-10-19 DIAGNOSIS — R42 Dizziness and giddiness: Secondary | ICD-10-CM | POA: Diagnosis not present

## 2021-10-19 DIAGNOSIS — H6981 Other specified disorders of Eustachian tube, right ear: Secondary | ICD-10-CM

## 2021-10-19 MED ORDER — ONDANSETRON 8 MG PO TBDP
8.0000 mg | ORAL_TABLET | Freq: Once | ORAL | Status: AC
  Administered 2021-10-19: 8 mg via ORAL

## 2021-10-19 MED ORDER — CETIRIZINE HCL 10 MG PO TABS: 10.0000 mg | ORAL_TABLET | Freq: Every day | ORAL | 0 refills | Status: DC

## 2021-10-19 MED ORDER — ONDANSETRON 4 MG PO TBDP
ORAL_TABLET | ORAL | Status: AC
  Filled 2021-10-19: qty 1

## 2021-10-19 MED ORDER — ONDANSETRON 4 MG PO TBDP: 4.0000 mg | ORAL_TABLET | Freq: Three times a day (TID) | ORAL | 0 refills | Status: DC | PRN

## 2021-10-19 MED ORDER — FLUTICASONE PROPIONATE 50 MCG/ACT NA SUSP: 1.0000 | Freq: Two times a day (BID) | NASAL | 0 refills | Status: DC

## 2021-10-19 MED ORDER — MECLIZINE HCL 25 MG PO TABS: 25.0000 mg | ORAL_TABLET | Freq: Three times a day (TID) | ORAL | 0 refills | Status: DC | PRN

## 2021-10-19 MED ORDER — MECLIZINE HCL 25 MG PO TABS
25.0000 mg | ORAL_TABLET | Freq: Once | ORAL | Status: AC
  Administered 2021-10-19: 25 mg via ORAL
  Filled 2021-10-19: qty 1

## 2021-10-19 NOTE — ED Triage Notes (Signed)
Pt states she has R inner ear pain that started a couple days ago and since then she has developed HA and dizziness- pt also has a hx of vertigo- pt was seen at [facility] earlier and was tested for covid/flu and it was negative ?

## 2021-10-19 NOTE — ED Provider Notes (Signed)
? ?[facility] ?Provider Note ? ?Patient Contact: 6:52 PM (approximate) ? ? ?History  ? ?Otalgia ? ? ?HPI ? ?Veronica Lynch is a 56 y.o. female who presents to the emergency department with right ear pain and vertigo-like symptoms.  Patient states that she has had 2 to 3 days of dizziness, developed all mild headache today.  Patient has had a history of vertigo, went to urgent care for evaluation but given the headache they referred her to the emergency department for imaging for CVA work-up.  Patient does have a history of migraines.  She states that this is more an annoying headache versus a true painful headache.  She has no visual changes, unilateral weakness, difficulty formulating thoughts or words.  Patient arrives with a family member who states that she is at her baseline.  Per the patient and her family member believe that she has vertigo.  No other complaints such as headache or neck pain, chest pain, shortness of breath, fevers or chills, sore throat, cough, GI symptoms ?  ? ? ?Physical Exam  ? ?Triage Vital Signs: ?ED Triage Vitals  ?Enc Vitals Group  ?   BP 10/19/21 1732 136/80  ?   Pulse Rate 10/19/21 1732 81  ?   Resp 10/19/21 1732 18  ?   Temp 10/19/21 1735 98.4 ?F (36.9 ?C)  ?   Temp Source 10/19/21 1735 Oral  ?   SpO2 10/19/21 1735 97 %  ?   Weight 10/19/21 1733 199 lb (90.3 kg)  ?   Height 10/19/21 1733 5\' 6"  (1.676 m)  ?   Head Circumference --   ?   Peak Flow --   ?   Pain Score 10/19/21 1732 7  ?   Pain Loc --   ?   Pain Edu? --   ?   Excl. in GC? --   ? ? ?Most recent vital signs: ?Vitals:  ? 10/19/21 1732 10/19/21 1735  ?BP: 136/80   ?Pulse: 81   ?Resp: 18   ?Temp:  98.4 ?F (36.9 ?C)  ?SpO2:  97%  ? ? ? ?General: Alert and in no acute distress. ?Eyes:  PERRL. EOMI. ?ENT: ?     Ears: EACs are reassuring bilaterally.  TM on right is bulging with no injection. ?     Nose: No congestion/rhinnorhea. ?     Mouth/Throat: Mucous membranes are moist. ?Neck: No stridor. No  cervical spine tenderness to palpation  ?Cardiovascular:  Good peripheral perfusion ?Respiratory: Normal respiratory effort without tachypnea or retractions. Lungs CTAB.  ?Musculoskeletal: Full range of motion to all extremities.  ?Neurologic:  No gross focal neurologic deficits are appreciated.  Cranial nerves grossly intact ?Skin:   No rash noted ?Other: ? ? ?ED Results / Procedures / Treatments  ? ?Labs ?(all labs ordered are listed, but only abnormal results are displayed) ?Labs Reviewed - No data to display ? ? ?EKG ? ? ? ? ?RADIOLOGY ? ? ? ?No results found. ? ?PROCEDURES: ? ?Critical Care performed: No ? ?Procedures ? ? ?MEDICATIONS ORDERED IN ED: ?Medications  ?meclizine (ANTIVERT) tablet 25 mg (has no administration in time range)  ? ? ? ?IMPRESSION / MDM / ASSESSMENT AND PLAN / ED COURSE  ?I reviewed the triage vital signs and the nursing notes. ?             ?               ? ?Differential diagnosis includes, but is  not limited to, vertigo, CVA, eustachian tube dysfunction, otitis media ? ? ?Patient's diagnosis is consistent with vertigo.  Patient presents emergency department with right ear pain and vertigo symptoms for several days.  She developed a mild headache earlier today, went to urgent care and was referred to the ED for imaging.  On exam, patient is neurologically intact.  She has what appears to be eustachian tube dysfunction on the right side and vertigo-like symptoms. HINTS exam reveals findings consistent with peripheral symptoms.  Patient has no CVA history.  No bleeding or clotting disorders.  I discussed the differential with the patient including a work-up to include labs and CT scan of the head and neck.  Patient and her family member both believe that this is vertigo and would like medications for same without pursuing imaging.  She is neurologically intact, exam was reassuring with no indication of a central source of vertigo, I do feel that patient would be appropriate for outpatient  management.  Have given strict return precautions including any concerning signs and symptoms for CVA.  They both verbalized understanding of same but again verbalized that they do not want to pursue labs or imaging at this time.  I will provide the first dose of meclizine here.  Symptoms should hopefully improve with use of allergy medication such as Flonase and Zyrtec to reduce the eustachian tube dysfunction as well as meclizine to further reduce vertigo symptoms.  Follow-up primary care as needed..  Patient is given ED precautions to return to the ED for any worsening or new symptoms. ? ? ? ?  ? ? ?FINAL CLINICAL IMPRESSION(S) / ED DIAGNOSES  ? ?Final diagnoses:  ?Vertigo  ?Dysfunction of right eustachian tube  ? ? ? ?Rx / DC Orders  ? ?ED Discharge Orders   ? ?      Ordered  ?  meclizine (ANTIVERT) 25 MG tablet  3 times daily PRN       ? 10/19/21 1922  ?  fluticasone (FLONASE) 50 MCG/ACT nasal spray  2 times daily       ? 10/19/21 1922  ?  cetirizine (ZYRTEC) 10 MG tablet  Daily       ? 10/19/21 1922  ? ?  ?  ? ?  ? ? ? ?Note:  This document was prepared using Dragon voice recognition software and may include unintentional dictation errors. ?  ?Racheal Patches, PA-C ?10/19/21 1930 ? ?  ?Dionne Bucy, MD ?10/20/21 0013 ? ?

## 2021-10-23 ENCOUNTER — Telehealth: Payer: Self-pay

## 2021-10-23 NOTE — Telephone Encounter (Signed)
Transition Care Management Follow-up Telephone Call ?Date of discharge and from where: 10/19/2021 [place] ?How have you been since you were released from the hospital? Pt states she has gotten better, ears hurt. She still experiences dizziness, not as bad as before but she cannot move too quickly.  ?Any questions or concerns? No ? ?Items Reviewed: ?Did the pt receive and understand the discharge instructions provided? Yes  ?Medications obtained and verified? Yes  ?Other? Yes   ?Any new allergies since your discharge? No  ?Dietary orders reviewed? Yes ?Do you have support at home? Yes  ? ?Home Care and Equipment/Supplies: ?Were home health services ordered? no ?If so, what is the name of the agency? N/a  ?Has the agency set up a time to come to the patient's home? no ?Were any new equipment or medical supplies ordered?  No ?What is the name of the medical supply agency? N/a ?Were you able to get the supplies/equipment? no ?Do you have any questions related to the use of the equipment or supplies? No ? ?Functional Questionnaire: (I = Independent and D = Dependent) ?ADLs: i ? ?Bathing/Dressing- i ? ?Meal Prep- i ? ?Eating- i ? ?Maintaining continence- i ? ?Transferring/Ambulation- i ? ?Managing Meds- i ? ?Follow up appointments reviewed: ? ?PCP Hospital f/u appt confirmed? Yes  Scheduled to see n/a on n/a @ n/a. ?Specialist Hospital f/u appt confirmed? No  Scheduled to see n/a on n/a @ n/a. ?Are transportation arrangements needed? No  ?If their condition worsens, is the pt aware to call PCP or go to the Emergency Dept.? Yes ?Was the patient provided with contact information for the PCP's office or ED? Yes ?Was to pt encouraged to call back with questions or concerns? Yes  ?

## 2021-10-24 ENCOUNTER — Ambulatory Visit: Payer: BC Managed Care – PPO | Admitting: Nurse Practitioner

## 2021-10-24 ENCOUNTER — Encounter: Payer: Self-pay | Admitting: Nurse Practitioner

## 2021-10-24 VITALS — BP 126/80 | HR 78 | Temp 98.5°F | Ht 66.0 in | Wt 205.2 lb

## 2021-10-24 DIAGNOSIS — Z2821 Immunization not carried out because of patient refusal: Secondary | ICD-10-CM

## 2021-10-24 DIAGNOSIS — R42 Dizziness and giddiness: Secondary | ICD-10-CM

## 2021-10-24 DIAGNOSIS — Z5320 Procedure and treatment not carried out because of patient's decision for unspecified reasons: Secondary | ICD-10-CM

## 2021-10-24 DIAGNOSIS — R110 Nausea: Secondary | ICD-10-CM | POA: Diagnosis not present

## 2021-10-24 MED ORDER — ONDANSETRON HCL 4 MG PO TABS: 4.0000 mg | ORAL_TABLET | Freq: Every day | ORAL | 0 refills | Status: DC | PRN

## 2021-10-24 NOTE — Patient Instructions (Signed)
Vertigo Vertigo is the feeling that you or the things around you are moving when they are not. This feeling can come and go at any time. Vertigo often goes away on its own. This condition can be dangerous if it happens when you are doingactivities like driving or working with machines. Your doctor will do tests to find the cause of your vertigo. These tests willalso help your doctor decide on the best treatment for you. Follow these instructions at home: Eating and drinking     Drink enough fluid to keep your pee (urine) pale yellow. Do not drink alcohol. Activity Return to your normal activities when your doctor says that it is safe. In the morning, first sit up on the side of the bed. When you feel okay, stand slowly while you hold onto something until you know that your balance is fine. Move slowly. Avoid sudden body or head movements or certain positions, as told by your doctor. Use a cane if you have trouble standing or walking. Sit down right away if you feel dizzy. Avoid doing any tasks or activities that can cause danger to you or others if you get dizzy. Avoid bending down if you feel dizzy. Place items in your home so that they are easy for you to reach without bending or leaning over. Do not drive or use machinery if you feel dizzy. General instructions Take over-the-counter and prescription medicines only as told by your doctor. Keep all follow-up visits. Contact a doctor if: Your medicine does not help your vertigo. Your problems get worse or you have new symptoms. You have a fever. You feel like you may vomit (nauseous), or this feeling gets worse. You start to vomit. Your family or friends see changes in how you act. You lose feeling (have numbness) in part of your body. You feel prickling and tingling in a part of your body. Get help right away if: You are always dizzy. You faint. You get very bad headaches. You get a stiff neck. Bright light starts to bother  you. You have trouble moving or talking. You feel weak in your hands, arms, or legs. You have changes in your hearing or in how you see (vision). These symptoms may be an emergency. Get help right away. Call your local emergency services (911 in the [place] Do not wait to see if the symptoms will go away. Do not drive yourself to the hospital. Summary Vertigo is the feeling that you or the things around you are moving when they are not. Your doctor will do tests to find the cause of your vertigo. You may be told to avoid some tasks, positions, or movements. Contact a doctor if your medicine is not helping, or if you have a fever, new symptoms, or a change in how you act. Get help right away if you get very bad headaches, or if you have changes in how you speak, hear, or see. This information is not intended to replace advice given to you by your health care provider. Make sure you discuss any questions you have with your healthcare provider. Document Revised: 06/01/2020 Document Reviewed: 06/01/2020 Elsevier Patient Education  2022 Elsevier Inc.  

## 2021-10-24 NOTE — Progress Notes (Signed)
?[employer] as a Education administrator for [employer], FNP.,have documented all relevant documentation on the behalf of [name], FNP,as directed by  [name], FNP while in the presence of [name], [place].  ?This visit occurred during the SARS-CoV-2 public health emergency.  Safety protocols were in place, including screening questions prior to the visit, additional usage of staff PPE, and extensive cleaning of exam room while observing appropriate contact time as indicated for disinfecting solutions. ? ?Subjective:  ?  ? Patient ID: Veronica Lynch , female    DOB: [birth year] , 56 y.o.   MRN: [id number] ? ? ?Chief Complaint  ?Patient presents with  ? ER follow-up  ? ? ?HPI ? ?The patient is here today for an ER follow-up. On Tuesday last week she felt dizzy by Saturday her right ear was hurting. Then she was in the bed for 4 days. Briefly on Saturday she was up. She had pressure to her head. She is no longer having any dizziness. Intermittent headache. She was treated with meclizine, zyrtec and flonase ?  ? ?Past Medical History:  ?Diagnosis Date  ? CVA (cerebral vascular accident) ([place]) 2018  ? CVA (cerebral vascular accident) ([place]) 2017  ? Diabetes ([place])   ? Hyperlipidemia   ? Hypertension   ? Shingles   ?  ? ?Family History  ?Problem Relation Age of Onset  ? Hypertension Mother   ? Hyperlipidemia Mother   ? Osteoporosis Mother   ? Cancer Father   ? Sarcoidosis Sister   ? Diabetes Maternal Aunt   ? ? ? ?Current Outpatient Medications:  ?  Alirocumab (PRALUENT) 75 MG/ML SOAJ, Inject 1 mL into the skin every 14 (fourteen) days., Disp: 2 mL, Rfl: 3 ?  Ascorbic Acid (VITAMIN C) 1000 MG tablet, Take 1,000 mg by mouth daily., Disp: , Rfl:  ?  aspirin EC 81 MG tablet, Take 1 tablet (81 mg total) by mouth daily. Swallow whole., Disp: 90 tablet, Rfl: 3 ?  Biotin 10 MG TABS, Take 10 mg by mouth daily., Disp: , Rfl:  ?  cetirizine (ZYRTEC) 10 MG tablet, Take 1 tablet (10 mg total) by mouth daily., Disp: 30  tablet, Rfl: 0 ?  clopidogrel (PLAVIX) 75 MG tablet, Take 1 tablet (75 mg total) by mouth daily., Disp: 90 tablet, Rfl: 3 ?  fluticasone (FLONASE) 50 MCG/ACT nasal spray, Place 1 spray into both nostrils 2 (two) times daily., Disp: 16 g, Rfl: 0 ?  hydrOXYzine (ATARAX) 10 MG tablet, Take 1 tablet (10 mg total) by mouth 3 (three) times daily as needed., Disp: 30 tablet, Rfl: 0 ?  meclizine (ANTIVERT) 25 MG tablet, Take 1 tablet (25 mg total) by mouth 3 (three) times daily as needed for dizziness., Disp: 30 tablet, Rfl: 0 ?  metoprolol succinate (TOPROL-XL) 50 MG 24 hr tablet, TAKE 1 TABLET BY MOUTH DAILY. TAKE WITH OR IMMEDIATELY FOLLOWING A MEAL., Disp: 90 tablet, Rfl: 1 ?  nitroGLYCERIN (NITROSTAT) 0.4 MG SL tablet, Place 0.4 mg under the tongue every 5 (five) minutes as needed for chest pain., Disp: , Rfl:  ?  ondansetron (ZOFRAN) 4 MG tablet, Take 1 tablet (4 mg total) by mouth daily as needed for nausea or vomiting., Disp: 30 tablet, Rfl: 0 ?  TRUE METRIX BLOOD GLUCOSE TEST test strip, Check blood sugar once daily, Disp: , Rfl:  ?  NOREL AD 4-10-325 MG TABS, Take one tablet by mouth daily., Disp: 30 tablet, Rfl: 0  ? ?Allergies  ?Allergen Reactions  ?  Bactrim [Sulfamethoxazole-Trimethoprim] Hives and Itching  ? Codeine Other (See Comments)  ?  Pass out   ?  ? ?Review of Systems  ?Constitutional: Negative.   ?Respiratory: Negative.    ?Cardiovascular: Negative.   ?Gastrointestinal: Negative.   ?Psychiatric/Behavioral: Negative.    ?All other systems reviewed and are negative.  ? ?Today's Vitals  ? 10/24/21 1453  ?BP: 126/80  ?Pulse: 78  ?Temp: 98.5 ?F (36.9 ?C)  ?Weight: 205 lb 3.2 oz (93.1 kg)  ?Height: 5\' 6"  (1.676 m)  ? ?Body mass index is 33.12 kg/m?.  ?Wt Readings from Last 3 Encounters:  ?10/24/21 205 lb 3.2 oz (93.1 kg)  ?10/19/21 199 lb (90.3 kg)  ?09/26/21 207 lb 3.2 oz (94 kg)  ?  ?BP Readings from Last 3 Encounters:  ?10/24/21 126/80  ?10/19/21 136/80  ?09/26/21 124/82  ?  ?Objective:  ?Physical  Exam ?Vitals reviewed.  ?Constitutional:   ?   General: She is not in acute distress. ?   Appearance: Normal appearance. She is well-developed. She is obese.  ?HENT:  ?   Head: Normocephalic and atraumatic.  ?Cardiovascular:  ?   Rate and Rhythm: Normal rate and regular rhythm.  ?   Pulses: Normal pulses.  ?   Heart sounds: Normal heart sounds. No murmur heard. ?Pulmonary:  ?   Effort: Pulmonary effort is normal. No respiratory distress.  ?   Breath sounds: Normal breath sounds. No wheezing.  ?Skin: ?   General: Skin is warm and dry.  ?   Capillary Refill: Capillary refill takes less than 2 seconds.  ?   Findings: No rash.  ?Neurological:  ?   General: No focal deficit present.  ?   Mental Status: She is alert and oriented to person, place, and time.  ?   Cranial Nerves: No cranial nerve deficit.  ?   Motor: No weakness.  ?Psychiatric:     ?   Mood and Affect: Mood normal.     ?   Behavior: Behavior normal.     ?   Thought Content: Thought content normal.     ?   Judgment: Judgment normal.  ?  ? ?   ?Assessment And Plan:  ?   ?1. Vertigo ?Comments: This has resolved after taking meclizine taking 3 times a day.  ? ?2. Nausea ?Comments: She is doing better since taking the Zofran and Meclizine ? ?3. COVID-19 vaccination declined ?Declines covid 19 vaccine. Discussed risk of covid 70 and if she changes her mind about the vaccine to call the office.  Encouraged to take multivitamin, vitamin d, vitamin c and zinc to increase immune system. Aware can call office if would like to have vaccine here at office.  ? ?4. Mammogram declined ?Discussed the importance of early detection and the risks of breast cancer especially with African American women.   ? ? ?Patient was given opportunity to ask questions. Patient verbalized understanding of the plan and was able to repeat key elements of the plan. All questions were answered to their satisfaction.  ?[name], FNP  ? ?I, [name], FNP, have reviewed all documentation  for this visit. The documentation on 10/24/2021 for the exam, diagnosis, procedures, and orders are all accurate and complete.  ? ?IF YOU HAVE BEEN REFERRED TO A SPECIALIST, IT MAY TAKE 1-2 WEEKS TO SCHEDULE/PROCESS THE REFERRAL. IF YOU HAVE NOT HEARD FROM US/SPECIALIST IN TWO WEEKS, PLEASE GIVE [place] A CALL AT [phone].  ? ?THE PATIENT IS ENCOURAGED TO  PRACTICE SOCIAL DISTANCING DUE TO THE COVID-19 PANDEMIC.   ?

## 2021-10-26 ENCOUNTER — Other Ambulatory Visit: Payer: Self-pay

## 2021-10-26 MED ORDER — NOREL AD 4-10-325 MG PO TABS: ORAL_TABLET | ORAL | 0 refills | Status: DC

## 2021-11-08 ENCOUNTER — Encounter: Payer: Self-pay | Admitting: Nurse Practitioner

## 2021-11-30 ENCOUNTER — Ambulatory Visit: Payer: BC Managed Care – PPO | Admitting: Cardiology

## 2021-12-04 ENCOUNTER — Encounter: Payer: Self-pay | Admitting: Cardiology

## 2021-12-04 ENCOUNTER — Ambulatory Visit: Payer: BC Managed Care – PPO | Admitting: Cardiology

## 2021-12-04 VITALS — BP 132/70 | HR 85 | Temp 98.0°F | Resp 16 | Ht 66.0 in | Wt 205.0 lb

## 2021-12-04 DIAGNOSIS — I2510 Atherosclerotic heart disease of native coronary artery without angina pectoris: Secondary | ICD-10-CM

## 2021-12-04 DIAGNOSIS — E782 Mixed hyperlipidemia: Secondary | ICD-10-CM

## 2021-12-04 DIAGNOSIS — I10 Essential (primary) hypertension: Secondary | ICD-10-CM

## 2021-12-04 MED ORDER — CLOPIDOGREL BISULFATE 75 MG PO TABS: 75.0000 mg | ORAL_TABLET | Freq: Every day | ORAL | 3 refills | Status: AC

## 2021-12-04 NOTE — Progress Notes (Signed)
Patient referred by Bary Castilla, NP for hypertension  Subjective:   Veronica Lynch, female    DOB: [birth year], 56 y.o.   MRN: [id number]   Chief Complaint  Patient presents with   Coronary Artery Disease   Follow-up    70 month     56 y.o. African American female with hypertension, mixed hyperlipidemia, h/o stroke (2018), CAD with stable angina  Patient is doing well. Denies angina symptoms. She is trying to do more physical activity.  Initial consultation HPI 11/2020: Patient works as an Web designer at [employer].  She moved from [place] to [place].  Prior to that, she lived in [place].  In 2017, patient had a stroke, fortunately with no significant residual deficit, following a lap band procedure. She was admitted at [facility] and then transferred to [facility]  More recently, she is experienced retrosternal chest pain and shortness of breath with walking.  She used to walk up to 10 miles a week, but this is down significantly in the last few months due to the above symptoms.  She denies any presyncope syncope, orthopnea, PND.  She has occasional leg edema.  Her sister has noticed that patient's heart rate has been irregular.  Patient had an EKG performed at her PCP office, copy of which is not legible.  I will request another copy.    Current Outpatient Medications:    Alirocumab (PRALUENT) 75 MG/ML SOAJ, Inject 1 mL into the skin every 14 (fourteen) days., Disp: 2 mL, Rfl: 3   Ascorbic Acid (VITAMIN C) 1000 MG tablet, Take 1,000 mg by mouth daily., Disp: , Rfl:    aspirin EC 81 MG tablet, Take 1 tablet (81 mg total) by mouth daily. Swallow whole., Disp: 90 tablet, Rfl: 3   Biotin 10 MG TABS, Take 10 mg by mouth daily., Disp: , Rfl:    cetirizine (ZYRTEC) 10 MG tablet, Take 1 tablet (10 mg total) by mouth daily., Disp: 30 tablet, Rfl: 0   clopidogrel (PLAVIX) 75 MG tablet, Take 1 tablet (75 mg  total) by mouth daily., Disp: 90 tablet, Rfl: 3   fluticasone (FLONASE) 50 MCG/ACT nasal spray, Place 1 spray into both nostrils 2 (two) times daily., Disp: 16 g, Rfl: 0   hydrOXYzine (ATARAX) 10 MG tablet, Take 1 tablet (10 mg total) by mouth 3 (three) times daily as needed., Disp: 30 tablet, Rfl: 0   meclizine (ANTIVERT) 25 MG tablet, Take 1 tablet (25 mg total) by mouth 3 (three) times daily as needed for dizziness., Disp: 30 tablet, Rfl: 0   metoprolol succinate (TOPROL-XL) 50 MG 24 hr tablet, TAKE 1 TABLET BY MOUTH DAILY. TAKE WITH OR IMMEDIATELY FOLLOWING A MEAL., Disp: 90 tablet, Rfl: 1   nitroGLYCERIN (NITROSTAT) 0.4 MG SL tablet, Place 0.4 mg under the tongue every 5 (five) minutes as needed for chest pain., Disp: , Rfl:    NOREL AD 4-10-325 MG TABS, Take one tablet by mouth daily., Disp: 30 tablet, Rfl: 0   ondansetron (ZOFRAN) 4 MG tablet, Take 1 tablet (4 mg total) by mouth daily as needed for nausea or vomiting., Disp: 30 tablet, Rfl: 0   TRUE METRIX BLOOD GLUCOSE TEST test strip, Check blood sugar once daily, Disp: , Rfl:   Cardiovascular and other pertinent studies:  EKG 12/04/2021: Sinus rhythm 85 bpm Normal EKG  Coronary intervention 05/16/2021: LM: Normal LAD: 40% prox LAD, 50% prox diag disease  LAD wraps around apex and gives left-to-right collaterals to distal RCA Lcx: Prox focal 80% stenosis        Left-to-right collaterals to distal RCA RCA: Prox subtotal occlusion, followed by mid 100% occlusion         Collateralized up to mid RCA by LAD and Lcx   Successful OCT guided percutaneous coronary intervention prox Lcx        PTCA and stent placement 2.0 X 15 mm Onyx Frontier drug-eluting stent        Post dilatation using 2.25 mm balloon at 16 atm        80%---0% residual stenosis        112% stent expansion, MSA 3.6 mm2  CT Cardiac scoring 11/28/2020: LM: 0 LAD: 11 LCx: 10 RCA: 0 Total score: 21 (88th percentile)  Mobile cardiac telemetry 5 days 11/23/2020  - 11/28/2020: Dominant rhythm: Sinus. HR 60-135 bpm. Avg HR 91 bpm. 0 episodes of SVT 3.3% isolated SVE, 1.7% couplets, <1%triplets. 0 episodes of VT <% isolated VE, no couplet/triplets. No atrial fibrillation/atrial flutter/SVT/VT/high grade AV block, sinus pause >3sec noted. 0 patient triggered events.   Echocardiogram 11/23/2020:  Normal LV systolic function with visual EF 60-65%. Left ventricle cavity  is normal in size. Normal global wall motion. Normal diastolic filling  pattern, normal LAP.  Trace tricuspid regurgitation. No evidence of pulmonary hypertension.  No prior study for comparison.  Exercise Sestamibi stress test 11/21/2020: Exercise nuclear stress test was performed using Bruce protocol. Patient reached 7.3 METS, and 82% of age predicted maximum heart rate. Exercise capacity was low. No chest pain reported. Exertional dyspnea and dizziness reported. Heart rate and hemodynamic response were normal. Stress EKG revealed no ischemic changes. SPECT images showed small size, mild intensity, reversible perfusion defect in apical to basal, inferior myocardium.  Stress LVEF 62%. Low risk study.  11/2016 Acute CVA s/p TPA   MRI brain 11/2016:  Normal   CTA head/neck 11/2016: No carotid stenosis Small thrombus noted in Rt M2 segment, too small for safe retrieval   Echocardiogram 11/2016: Normal LV size. LVEF 55-65%. Mild LVH. Normal diastolic function.    Recent labs: 05/17/2021: Glucose 134, BUN/Cr 11/0.72. EGFR >60. Na/K 141/3.7. Rest of the CMP normal H/H 11/33. MCV 86. Platelets 189 HbA1C 6.3% Chol 130, TG 61, HDL 46, LDL 72 TSH NA  10/22/2020: Glucose 113, BUN/Cr 11/0.70. EGFR 98. Na/K 141/4.1. Rest of the CMP normal H/H 12.5/37.8 MCV 85.7. Platelets 246 HbA1C 7.0% Chol 247, TG 184, HDL 57, LDL 156 TSH 0.92 normal    Review of Systems  Constitutional: Positive for malaise/fatigue.  Cardiovascular:  Positive for chest pain (Only one episode). Negative for  dyspnea on exertion, leg swelling, palpitations and syncope.        Vitals:   12/04/21 1421  BP: 132/70  Pulse: 85  Resp: 16  Temp: 98 F (36.7 C)  SpO2: 96%    Body mass index is 33.09 kg/m. Filed Weights   12/04/21 1421  Weight: 205 lb (93 kg)     Objective:   Physical Exam Vitals and nursing note reviewed.  Constitutional:      General: She is not in acute distress. Neck:     Vascular: No JVD.  Cardiovascular:     Rate and Rhythm: Normal rate and regular rhythm.     Pulses: Normal pulses.     Heart sounds: Normal heart sounds. No murmur heard. Pulmonary:     Effort: Pulmonary effort is normal.  Breath sounds: Normal breath sounds. No wheezing or rales.  Musculoskeletal:     Right lower leg: No edema.     Left lower leg: No edema.        Assessment & Recommendations:   56 y.o. African American female with hypertension, mixed hyperlipidemia, h/o stroke (2018), CAD s/p pLCx PCI, known RCA CTO  CAD: RCA CTO. Successful pLCx PCI (04/2021) No angina symptoms. Completed 6 months DAPT. Recommend plavix monotherapy, stop Aspirin (PANTHER trial) Continue Praluent. Known statin myalgias. LDL down to 72. Continue metoprolol succinate 50 mg daily. Can be stopped in future.  Hypertension: Controlled  Mixed hyperlipidemia: Management as above  F/u in 1 year   Nigel Mormon, MD Pager: [phone] Office: [phone]

## 2021-12-19 ENCOUNTER — Other Ambulatory Visit (HOSPITAL_COMMUNITY): Payer: Self-pay

## 2022-01-14 ENCOUNTER — Other Ambulatory Visit: Payer: Self-pay | Admitting: Cardiology

## 2022-01-14 DIAGNOSIS — I25118 Atherosclerotic heart disease of native coronary artery with other forms of angina pectoris: Secondary | ICD-10-CM

## 2022-01-15 ENCOUNTER — Other Ambulatory Visit: Payer: Self-pay | Admitting: Cardiology

## 2022-01-15 DIAGNOSIS — I25118 Atherosclerotic heart disease of native coronary artery with other forms of angina pectoris: Secondary | ICD-10-CM

## 2022-01-17 ENCOUNTER — Other Ambulatory Visit: Payer: Self-pay | Admitting: Cardiology

## 2022-01-17 DIAGNOSIS — I25118 Atherosclerotic heart disease of native coronary artery with other forms of angina pectoris: Secondary | ICD-10-CM

## 2022-02-14 ENCOUNTER — Other Ambulatory Visit: Payer: Self-pay | Admitting: Nurse Practitioner

## 2022-02-14 ENCOUNTER — Other Ambulatory Visit: Payer: Self-pay | Admitting: Cardiology

## 2022-02-14 DIAGNOSIS — I25118 Atherosclerotic heart disease of native coronary artery with other forms of angina pectoris: Secondary | ICD-10-CM

## 2022-02-21 ENCOUNTER — Ambulatory Visit (INDEPENDENT_AMBULATORY_CARE_PROVIDER_SITE_OTHER): Payer: BC Managed Care – PPO | Admitting: Nurse Practitioner

## 2022-02-21 ENCOUNTER — Encounter: Payer: Self-pay | Admitting: Nurse Practitioner

## 2022-02-21 VITALS — BP 124/64 | HR 64 | Temp 98.1°F | Ht 66.0 in | Wt 208.0 lb

## 2022-02-21 DIAGNOSIS — Z9884 Bariatric surgery status: Secondary | ICD-10-CM

## 2022-02-21 DIAGNOSIS — Z1231 Encounter for screening mammogram for malignant neoplasm of breast: Secondary | ICD-10-CM

## 2022-02-21 DIAGNOSIS — I25118 Atherosclerotic heart disease of native coronary artery with other forms of angina pectoris: Secondary | ICD-10-CM

## 2022-02-21 DIAGNOSIS — R52 Pain, unspecified: Secondary | ICD-10-CM

## 2022-02-21 DIAGNOSIS — Z0000 Encounter for general adult medical examination without abnormal findings: Secondary | ICD-10-CM | POA: Diagnosis not present

## 2022-02-21 DIAGNOSIS — Z79899 Other long term (current) drug therapy: Secondary | ICD-10-CM

## 2022-02-21 DIAGNOSIS — Z789 Other specified health status: Secondary | ICD-10-CM

## 2022-02-21 DIAGNOSIS — E669 Obesity, unspecified: Secondary | ICD-10-CM

## 2022-02-21 DIAGNOSIS — Z1159 Encounter for screening for other viral diseases: Secondary | ICD-10-CM

## 2022-02-21 DIAGNOSIS — E782 Mixed hyperlipidemia: Secondary | ICD-10-CM | POA: Diagnosis not present

## 2022-02-21 DIAGNOSIS — Z1211 Encounter for screening for malignant neoplasm of colon: Secondary | ICD-10-CM

## 2022-02-21 DIAGNOSIS — I10 Essential (primary) hypertension: Secondary | ICD-10-CM

## 2022-02-21 DIAGNOSIS — I119 Hypertensive heart disease without heart failure: Secondary | ICD-10-CM

## 2022-02-21 DIAGNOSIS — E1169 Type 2 diabetes mellitus with other specified complication: Secondary | ICD-10-CM | POA: Diagnosis not present

## 2022-02-21 DIAGNOSIS — M7989 Other specified soft tissue disorders: Secondary | ICD-10-CM

## 2022-02-21 MED ORDER — HYDROCHLOROTHIAZIDE 25 MG PO TABS: 25.0000 mg | ORAL_TABLET | Freq: Every day | ORAL | 3 refills | Status: DC

## 2022-02-21 MED ORDER — CELECOXIB 200 MG PO CAPS: 200.0000 mg | ORAL_CAPSULE | Freq: Every day | ORAL | 2 refills | Status: DC

## 2022-02-21 NOTE — Patient Instructions (Signed)

## 2022-02-21 NOTE — Progress Notes (Signed)
I,Veronica Lynch,acting as a Education administrator for [employer], FNP.,have documented all relevant documentation on the behalf of Veronica Brine, FNP,as directed by  Veronica Brine, FNP while in the presence of Veronica Lynch, [place].  Subjective:     Patient ID: Veronica Lynch , female    DOB: 11-Aug-1965 , 56 y.o.   MRN: [id number]   Chief Complaint  Patient presents with   Annual Exam    HPI  Patient presents today for HM. She sits a lot when working. She is not wearing support socks. She is taking one of her sisters water pill.  She had taken furosemide and HCTZ years ago and after losing weight, she was on it due to fluid. Veronica Broad MD at [facility]     Past Medical History:  Diagnosis Date   CVA (cerebral vascular accident) [facility]) 2018   CVA (cerebral vascular accident) (Veronica Lynch) 2017   Diabetes ([place])    Hyperlipidemia    Hypertension    Shingles      Family History  Problem Relation Age of Onset   Hypertension Mother    Hyperlipidemia Mother    Osteoporosis Mother    Cancer Father    Sarcoidosis Sister    Diabetes Maternal Aunt      Current Outpatient Medications:    Ascorbic Acid (VITAMIN C) 1000 MG tablet, Take 1,000 mg by mouth daily., Disp: , Rfl:    Biotin 10 MG TABS, Take 10 mg by mouth daily., Disp: , Rfl:    celecoxib (CELEBREX) 200 MG capsule, Take 1 capsule (200 mg total) by mouth daily., Disp: 30 capsule, Rfl: 2   clopidogrel (PLAVIX) 75 MG tablet, Take 1 tablet (75 mg total) by mouth daily., Disp: 90 tablet, Rfl: 3   fluticasone (FLONASE) 50 MCG/ACT nasal spray, Place 1 spray into both nostrils 2 (two) times daily., Disp: 16 g, Rfl: 0   hydrochlorothiazide (HYDRODIURIL) 25 MG tablet, Take 1 tablet (25 mg total) by mouth daily. As needed for leg swelling, Disp: 30 tablet, Rfl: 3   metoprolol succinate (TOPROL-XL) 50 MG 24 hr tablet, TAKE 1 TABLET BY MOUTH EVERY DAY WITH OR IMMEDIATELY FOLLOWING A MEAL, Disp: 90 tablet, Rfl: 1   nitroGLYCERIN (NITROSTAT) 0.4 MG SL  tablet, Place 0.4 mg under the tongue every 5 (five) minutes as needed for chest pain., Disp: , Rfl:    NOREL AD 4-10-325 MG TABS, TAKE 1 TABLET BY MOUTH DAILY, Disp: 30 tablet, Rfl: 0   ondansetron (ZOFRAN) 4 MG tablet, TAKE 1 TABLET BY MOUTH DAILY AS NEEDED FOR NAUSEA OR VOMITING., Disp: 18 tablet, Rfl: 1   TRUE METRIX BLOOD GLUCOSE TEST test strip, Check blood sugar once daily, Disp: , Rfl:    PRALUENT 75 MG/ML SOAJ, INJECT 75 MG INTO THE SKIN 2 (TWO) TIMES A WEEK., Disp: 75 mL, Rfl: 3   Allergies  Allergen Reactions   Bactrim [Sulfamethoxazole-Trimethoprim] Hives and Itching   Codeine Other (See Comments)    Pass out       The patient states she is status post hysterectomy . Negative for: breast discharge, breast lump(s), breast pain and breast self exam. Associated symptoms include abnormal vaginal bleeding. Pertinent negatives include abnormal bleeding (hematology), anxiety, decreased libido, depression, difficulty falling sleep, dyspareunia, history of infertility, nocturia, sexual dysfunction, sleep disturbances, urinary incontinence, urinary urgency, vaginal discharge and vaginal itching. Diet regular - healthy options - healthy carbs, meats, fruit, vegetables. The patient states her exercise level is 6 days a week for at least  2-4 miles.  The patient's tobacco use is:  Social History   Tobacco Use  Smoking Status Never  Smokeless Tobacco Never   She has been exposed to passive smoke. The patient's alcohol use is:  Social History   Substance and Sexual Activity  Alcohol Use Yes   Comment: occ wine     Review of Systems  Constitutional: Negative.   HENT: Negative.    Eyes: Negative.   Respiratory: Negative.    Cardiovascular: Negative.   Gastrointestinal: Negative.   Endocrine: Negative.   Genitourinary: Negative.   Musculoskeletal: Negative.   Skin: Negative.   Allergic/Immunologic: Negative.   Neurological: Negative.   Hematological: Negative.    Psychiatric/Behavioral: Negative.       Today's Vitals   02/21/22 1116  BP: 124/64  Pulse: 64  Temp: 98.1 F (36.7 C)  TempSrc: Oral  Weight: 208 lb (94.3 kg)  Height: '5\' 6"'  (1.676 m)   Body mass index is 33.57 kg/m.   Objective:  Physical Exam Vitals reviewed.  Constitutional:      General: She is not in acute distress.    Appearance: Normal appearance. She is well-developed. She is obese.  HENT:     Head: Normocephalic and atraumatic.     Right Ear: Hearing, tympanic membrane, ear canal and external ear normal. There is no impacted cerumen.     Left Ear: Hearing, tympanic membrane, ear canal and external ear normal. There is no impacted cerumen.     Nose: Nose normal.     Mouth/Throat:     Mouth: Mucous membranes are moist.  Eyes:     General: Lids are normal.     Extraocular Movements: Extraocular movements intact.     Conjunctiva/sclera: Conjunctivae normal.     Pupils: Pupils are equal, round, and reactive to light.     Funduscopic exam:    Right eye: No papilledema.        Left eye: No papilledema.  Neck:     Thyroid: No thyroid mass.     Vascular: No carotid bruit.  Cardiovascular:     Rate and Rhythm: Normal rate and regular rhythm.     Pulses: Normal pulses.     Heart sounds: Normal heart sounds. No murmur heard. Pulmonary:     Effort: Pulmonary effort is normal. No respiratory distress.     Breath sounds: Normal breath sounds. No wheezing.  Chest:     Chest wall: No mass.  Breasts:    Tanner Score is 5.     Right: Normal. No mass or tenderness.     Left: Normal. No mass or tenderness.  Abdominal:     General: Abdomen is flat. Bowel sounds are normal. There is no distension.     Palpations: Abdomen is soft.     Tenderness: There is no abdominal tenderness.  Musculoskeletal:        General: No swelling or tenderness. Normal range of motion.     Cervical back: Full passive range of motion without pain, normal range of motion and neck supple.      Right lower leg: Edema present.     Left lower leg: Edema present.  Lymphadenopathy:     Upper Body:     Right upper body: No supraclavicular, axillary or pectoral adenopathy.     Left upper body: No supraclavicular, axillary or pectoral adenopathy.  Skin:    General: Skin is warm and dry.     Capillary Refill: Capillary refill takes less than 2 seconds.  Neurological:     General: No focal deficit present.     Mental Status: She is alert and oriented to person, place, and time.     Cranial Nerves: No cranial nerve deficit.     Sensory: No sensory deficit.     Motor: No weakness.  Psychiatric:        Mood and Affect: Mood normal.        Behavior: Behavior normal.        Thought Content: Thought content normal.        Judgment: Judgment normal.         Assessment And Plan:     1. Routine general medical examination at health care facility Behavior modifications discussed and diet history reviewed.   Pt will continue to exercise regularly and modify diet with low GI, plant based foods and decrease intake of processed foods.  Recommend intake of daily multivitamin, Vitamin D, and calcium.  Recommend mammogram and colonoscopy for preventive screenings, as well as recommend immunizations that include influenza, TDAP, and Shingles (declines vaccines)  2. Encounter for screening mammogram for breast cancer Pt instructed on Self Breast Exam.According to ACOG guidelines Women aged 51 and older are recommended to get an annual mammogram. Form completed and given to patient contact the [facility] for appointment scheduing.  Pt encouraged to get annual mammogram  3. Encounter for hepatitis C screening test for low risk patient Will check Hepatitis C screening due to recent recommendations to screen all adults 18 years and older - Hepatitis C antibody  4. History of laparoscopic adjustable gastric banding Comments: Done in 2010, palpable band to right mid abdomen  5. Screening  for colon cancer According to USPTF Colorectal cancer Screening guidelines. Colonoscopy is recommended every 10 years, starting at age 78 years. Will refer to GI for colon cancer screening. - Ambulatory referral to Gastroenterology  6. Other long term (current) drug therapy - CBC  7. Essential hypertension Comments: Blood pressure is well controlled, continue follow up with Cardiology  8. Mixed hyperlipidemia Comments: Better controlled, she was on praluent but is awaiting a change from Cardiology due to insurance no longer covering - CMP14+EGFR - Lipid panel  9. Statin intolerance Comments: She does not feel well when taking she had been on praluent until recently and the insurance does not cover  10. Coronary artery disease of native artery of native heart with stable angina pectoris [facility]) Comments: Continue follow up with Cardiology - Lipid panel  11. Diabetes mellitus type 2 in obese [facility]) Comments: She has had an elevated HgbA1c in the past, most recent was 6.2. Encouraged to continue regular exercise and healthy eating. - Hemoglobin A1c - CMP14+EGFR - Comprehensive metabolic panel with eGFR (no eGFR if sent to [facility])  12. Leg swelling Comments: Trace swelling to lower extremities, will provide HCTZ as needed for swelling encouraged to stay well hydrated when taking. Advised to avoid taking other meds - hydrochlorothiazide (HYDRODIURIL) 25 MG tablet; Take 1 tablet (25 mg total) by mouth daily. As needed for leg swelling  Dispense: 30 tablet; Refill: 3  13. Body aches Comments: Has been using celebrex given by Dr. Criss Rosales with good effect - celecoxib (CELEBREX) 200 MG capsule; Take 1 capsule (200 mg total) by mouth daily.  Dispense: 30 capsule; Refill: 2   Patient was given opportunity to ask questions. Patient verbalized understanding of the plan and was able to repeat key elements of the plan. All questions were answered to their satisfaction.  Veronica Brine, FNP   I,  Veronica Brine, FNP, have reviewed all documentation for this visit. The documentation on 02/21/22 for the exam, diagnosis, procedures, and orders are all accurate and complete.   THE PATIENT IS ENCOURAGED TO PRACTICE SOCIAL DISTANCING DUE TO THE COVID-19 PANDEMIC.

## 2022-02-22 ENCOUNTER — Other Ambulatory Visit: Payer: Self-pay | Admitting: Nurse Practitioner

## 2022-02-22 DIAGNOSIS — E669 Obesity, unspecified: Secondary | ICD-10-CM

## 2022-02-22 LAB — LIPID PANEL
Chol/HDL Ratio: 3.3 ratio (ref 0.0–4.4)
Cholesterol, Total: 197 mg/dL (ref 100–199)
HDL: 59 mg/dL (ref >39)
LDL Chol Calc (NIH): 102 mg/dL — ABNORMAL HIGH (ref 0–99)
Triglycerides: 212 mg/dL — ABNORMAL HIGH (ref 0–149)
VLDL Cholesterol Cal: 36 mg/dL (ref 5–40)

## 2022-02-22 LAB — CMP14+EGFR
ALT: 16 IU/L (ref 0–32)
AST: 15 IU/L (ref 0–40)
Albumin/Globulin Ratio: 1.4 (ref 1.2–2.2)
Albumin: 4.3 g/dL (ref 3.8–4.9)
Alkaline Phosphatase: 85 IU/L (ref 44–121)
BUN/Creatinine Ratio: 14 (ref 9–23)
BUN: 10 mg/dL (ref 6–24)
Bilirubin Total: 0.6 mg/dL (ref 0.0–1.2)
CO2: 26 mmol/L (ref 20–29)
Calcium: 9.6 mg/dL (ref 8.7–10.2)
Chloride: 99 mmol/L (ref 96–106)
Creatinine, Ser: 0.69 mg/dL (ref 0.57–1.00)
Globulin, Total: 3 g/dL (ref 1.5–4.5)
Glucose: 145 mg/dL — ABNORMAL HIGH (ref 70–99)
Potassium: 4.7 mmol/L (ref 3.5–5.2)
Sodium: 139 mmol/L (ref 134–144)
Total Protein: 7.3 g/dL (ref 6.0–8.5)
eGFR: 102 mL/min/{1.73_m2} (ref >59)

## 2022-02-22 LAB — CBC
Hematocrit: 37.9 % (ref 34.0–46.6)
Hemoglobin: 13 g/dL (ref 11.1–15.9)
MCH: 29.5 pg (ref 26.6–33.0)
MCHC: 34.3 g/dL (ref 31.5–35.7)
MCV: 86 fL (ref 79–97)
Platelets: 235 10*3/uL (ref 150–450)
RBC: 4.41 x10E6/uL (ref 3.77–5.28)
RDW: 12 % (ref 11.7–15.4)
WBC: 6.1 10*3/uL (ref 3.4–10.8)

## 2022-02-22 LAB — HEMOGLOBIN A1C
Est. average glucose Bld gHb Est-mCnc: 177 mg/dL
Hgb A1c MFr Bld: 7.8 % — ABNORMAL HIGH (ref 4.8–5.6)

## 2022-02-22 LAB — HEPATITIS C ANTIBODY: Hep C Virus Ab: NONREACTIVE

## 2022-02-22 MED ORDER — METFORMIN HCL 500 MG PO TABS: 500.0000 mg | ORAL_TABLET | Freq: Two times a day (BID) | ORAL | 2 refills | Status: DC

## 2022-05-16 ENCOUNTER — Other Ambulatory Visit: Payer: Self-pay | Admitting: Nurse Practitioner

## 2022-05-16 DIAGNOSIS — R52 Pain, unspecified: Secondary | ICD-10-CM

## 2022-05-19 ENCOUNTER — Other Ambulatory Visit: Payer: Self-pay | Admitting: Nurse Practitioner

## 2022-05-19 DIAGNOSIS — M7989 Other specified soft tissue disorders: Secondary | ICD-10-CM

## 2022-06-05 ENCOUNTER — Encounter: Payer: Self-pay | Admitting: Nurse Practitioner

## 2022-06-05 ENCOUNTER — Ambulatory Visit: Payer: BC Managed Care – PPO | Admitting: Nurse Practitioner

## 2022-06-05 VITALS — BP 108/78 | Temp 98.1°F | Ht 66.0 in | Wt 202.6 lb

## 2022-06-05 DIAGNOSIS — I119 Hypertensive heart disease without heart failure: Secondary | ICD-10-CM | POA: Diagnosis not present

## 2022-06-05 DIAGNOSIS — E782 Mixed hyperlipidemia: Secondary | ICD-10-CM | POA: Diagnosis not present

## 2022-06-05 DIAGNOSIS — I25118 Atherosclerotic heart disease of native coronary artery with other forms of angina pectoris: Secondary | ICD-10-CM | POA: Diagnosis not present

## 2022-06-05 DIAGNOSIS — E6609 Other obesity due to excess calories: Secondary | ICD-10-CM

## 2022-06-05 DIAGNOSIS — Z789 Other specified health status: Secondary | ICD-10-CM

## 2022-06-05 DIAGNOSIS — G4709 Other insomnia: Secondary | ICD-10-CM

## 2022-06-05 DIAGNOSIS — R232 Flushing: Secondary | ICD-10-CM

## 2022-06-05 DIAGNOSIS — E1169 Type 2 diabetes mellitus with other specified complication: Secondary | ICD-10-CM

## 2022-06-05 DIAGNOSIS — Z6832 Body mass index (BMI) 32.0-32.9, adult: Secondary | ICD-10-CM

## 2022-06-05 DIAGNOSIS — I10 Essential (primary) hypertension: Secondary | ICD-10-CM

## 2022-06-05 LAB — POCT URINALYSIS DIPSTICK
Bilirubin, UA: NEGATIVE
Blood, UA: NEGATIVE
Glucose, UA: NEGATIVE
Ketones, UA: NEGATIVE
Leukocytes, UA: NEGATIVE
Nitrite, UA: NEGATIVE
Protein, UA: NEGATIVE
Spec Grav, UA: 1.015 (ref 1.010–1.025)
Urobilinogen, UA: 0.2 E.U./dL
pH, UA: 6 (ref 5.0–8.0)

## 2022-06-05 MED ORDER — VEOZAH 45 MG PO TABS: 1.0000 | ORAL_TABLET | Freq: Every day | ORAL | 3 refills | Status: DC

## 2022-06-05 MED ORDER — FUROSEMIDE 20 MG PO TABS: 20.0000 mg | ORAL_TABLET | Freq: Every day | ORAL | 2 refills | Status: DC

## 2022-06-05 NOTE — Progress Notes (Signed)
I,Victoria T Hamilton,acting as a Neurosurgeon for [name], FNP.,have documented all relevant documentation on the behalf of [name], FNP,as directed by  [name], FNP while in the presence of [name], FNP.    Subjective:     Patient ID: Veronica Lynch , female    DOB: 1966/02/18 , 56 y.o.   MRN: [id number]   Chief Complaint  Patient presents with   Diabetes    HPI  Pt presents today for bp & dm f/u.  She does not want totake metformin 2 boiled eggs and oatmeal.  She eats mostly vegetables and eggs.  She tries to walk 2 miles a day.  She reports hctz she feels does not help pull enough of her fluid. She feels she wakes up and lays down with fluid.   She will take over the counter pms. When she would take tizanidine.  At night her legs hurt and would be restless. She has taken gabapentin in the past and caused her to be off balance.   She does sit a lot at her job. She will walk in the building occasionally.   She adds, not eating since 10:30am. Denies headache, SOB, blurred vision, chest pain.   She has taken Praluent in the past but insurance no longer covers.   She went through immediate menopause 25 years ago having her cervix and ovaries removed. She was on premarin at one point.   Diabetes She presents for her follow-up diabetic visit. She has type 2 diabetes mellitus. There are no hypoglycemic associated symptoms. There are no diabetic associated symptoms. There are no hypoglycemic complications. There are no diabetic complications. Risk factors for coronary artery disease include obesity, hypertension and diabetes mellitus. Current diabetic treatment includes diet. She is following a generally healthy diet. She has not had a previous visit with a dietitian. She participates in exercise daily. An ACE inhibitor/angiotensin II receptor blocker is being taken. She does not see a podiatrist.Eye exam is current.     Past Medical History:  Diagnosis Date   CVA  (cerebral vascular accident) ([facility]) 2018   CVA (cerebral vascular accident) ([facility]) 2017   Diabetes ([facility])    Hyperlipidemia    Hypertension    Shingles      Family History  Problem Relation Age of Onset   Hypertension Mother    Hyperlipidemia Mother    Osteoporosis Mother    Cancer Father    Sarcoidosis Sister    Diabetes Maternal Aunt      Current Outpatient Medications:    Ascorbic Acid (VITAMIN C) 1000 MG tablet, Take 1,000 mg by mouth daily., Disp: , Rfl:    ASPIRIN LOW DOSE 81 MG tablet, Take 81 mg by mouth daily., Disp: , Rfl:    Biotin 10 MG TABS, Take 10 mg by mouth daily., Disp: , Rfl:    celecoxib (CELEBREX) 200 MG capsule, TAKE 1 CAPSULE BY MOUTH EVERY DAY, Disp: 30 capsule, Rfl: 2   clopidogrel (PLAVIX) 75 MG tablet, Take 1 tablet (75 mg total) by mouth daily., Disp: 90 tablet, Rfl: 3   dapagliflozin propanediol (FARXIGA) 10 MG TABS tablet, Take 1 tablet (10 mg total) by mouth daily before breakfast., Disp: 30 tablet, Rfl: 2   Fezolinetant (VEOZAH) 45 MG TABS, Take 1 tablet by mouth daily., Disp: 30 tablet, Rfl: 3   fluticasone (FLONASE) 50 MCG/ACT nasal spray, Place 1 spray into both nostrils 2 (two) times daily., Disp: 16 g, Rfl: 0   furosemide (LASIX) 20 MG  tablet, Take 1 tablet (20 mg total) by mouth daily. As needed for leg swelling, Disp: 30 tablet, Rfl: 2   hydrochlorothiazide (HYDRODIURIL) 25 MG tablet, TAKE 1 TABLET (25 MG TOTAL) BY MOUTH DAILY. AS NEEDED FOR LEG SWELLING, Disp: 90 tablet, Rfl: 1   metoprolol succinate (TOPROL-XL) 50 MG 24 hr tablet, TAKE 1 TABLET BY MOUTH EVERY DAY WITH OR IMMEDIATELY FOLLOWING A MEAL, Disp: 90 tablet, Rfl: 1   nitroGLYCERIN (NITROSTAT) 0.4 MG SL tablet, Place 0.4 mg under the tongue every 5 (five) minutes as needed for chest pain., Disp: , Rfl:    NOREL AD 4-10-325 MG TABS, TAKE 1 TABLET BY MOUTH DAILY, Disp: 30 tablet, Rfl: 0   ondansetron (ZOFRAN) 4 MG tablet, TAKE 1 TABLET BY MOUTH DAILY AS NEEDED FOR NAUSEA OR VOMITING., Disp:  18 tablet, Rfl: 1   TRUE METRIX BLOOD GLUCOSE TEST test strip, Check blood sugar once daily, Disp: , Rfl:    Evolocumab (REPATHA SURECLICK) 140 MG/ML SOAJ, Inject 140 mg into the skin every 14 (fourteen) days., Disp: 6 mL, Rfl: 5   Allergies  Allergen Reactions   Bactrim [Sulfamethoxazole-Trimethoprim] Hives and Itching   Codeine Other (See Comments)    Pass out      Review of Systems  Constitutional: Negative.   Respiratory: Negative.    Cardiovascular: Negative.   Musculoskeletal:        Bilateral leg pain with touching. Right more than left  Neurological: Negative.   Psychiatric/Behavioral: Negative.       Today's Vitals   06/05/22 1501  BP: 108/78  Temp: 98.1 F (36.7 C)  SpO2: 98%  Weight: 202 lb 9.6 oz (91.9 kg)  Height: 5\' 6"  (1.676 m)   Body mass index is 32.7 kg/m.   Objective:  Physical Exam Vitals reviewed.  Constitutional:      General: She is not in acute distress.    Appearance: Normal appearance. She is obese.  Cardiovascular:     Rate and Rhythm: Normal rate and regular rhythm.     Pulses: Normal pulses.     Heart sounds: Normal heart sounds. No murmur heard. Pulmonary:     Effort: Pulmonary effort is normal. No respiratory distress.     Breath sounds: Normal breath sounds. No wheezing.  Neurological:     Mental Status: She is alert.         Assessment And Plan:     1. Diabetes mellitus type 2 in obese [facility]) Comments: She is not currently on any medications does not want to take metformin. Discussed risk of diabetes affecting kidneys and heart, will check levels today. - Ambulatory referral to Ophthalmology - Hemoglobin A1c  2. Essential hypertension Comments: Blood pressure is well controlled. Continue current medications. - CMP14 + Anion Gap - POCT Urinalysis Dipstick (81002)  3. Mixed hyperlipidemia Comments: Unable to tolerate statins, continue focusing on low fat diet and f/u with [facility] to see your options - Lipid panel -  CMP14 + Anion Gap  4. Statin intolerance  5. Hot flashes Comments: Will try her on Veozah due to low side effect profile - Fezolinetant (VEOZAH) 45 MG TABS; Take 1 tablet by mouth daily.  Dispense: 30 tablet; Refill: 3  6. Other insomnia Comments: This may be related to her menopause symptoms. Encouraged to take melatonin at bedtime. - Fezolinetant (VEOZAH) 45 MG TABS; Take 1 tablet by mouth daily.  Dispense: 30 tablet; Refill: 3 - TSH  7. Coronary artery disease of native artery of native  heart with stable angina pectoris [facility]) Comments: She is no longer taking Praluent due to insurance coverage.  8. Class 1 obesity due to excess calories without serious comorbidity with body mass index (BMI) of 32.0 to 32.9 in adult She is encouraged to strive for BMI less than 30 to decrease cardiac risk. Advised to aim for at least 150 minutes of exercise per week.      Patient was given opportunity to ask questions. Patient verbalized understanding of the plan and was able to repeat key elements of the plan. All questions were answered to their satisfaction.  [name], FNP    I, [name], FNP, have reviewed all documentation for this visit. The documentation on 06/05/22 for the exam, diagnosis, procedures, and orders are all accurate and complete.  IF YOU HAVE BEEN REFERRED TO A SPECIALIST, IT MAY TAKE 1-2 WEEKS TO SCHEDULE/PROCESS THE REFERRAL. IF YOU HAVE NOT HEARD FROM US/SPECIALIST IN TWO WEEKS, PLEASE GIVE [place] A CALL AT [phone].   THE PATIENT IS ENCOURAGED TO PRACTICE SOCIAL DISTANCING DUE TO THE COVID-19 PANDEMIC.

## 2022-06-05 NOTE — Patient Instructions (Signed)
Diabetes Mellitus Action Plan Following a diabetes action plan is a way for you to manage your diabetes (diabetes mellitus) symptoms. The plan is color-coded to help you understand what actions you need to take based on any symptoms you are having. If you have symptoms in the red zone, you need medical care right away. If you have symptoms in the yellow zone, you are having problems. If you have symptoms in the green zone, you are doing well. Learning about and understanding diabetes can take time. Follow the plan that you develop with your health care provider. Know the target range for your blood sugar (glucose) level, and review your treatment plan with your health care provider at each visit. The target range for my blood sugar level is __________________________ mg/dL. Red zone Get medical help right away if you have any of the following symptoms: A blood sugar test result that is below 54 mg/dL (3 mmol/L). A blood sugar test result that is at or above 240 mg/dL (13.3 mmol/L) for 2 days in a row. Confusion or trouble thinking clearly. Difficulty breathing. Sickness or a fever for 2 or more days that is not getting better. Moderate or large ketone levels in your urine. Feeling tired or having no energy. If you have any red zone symptoms, do not wait to see if the symptoms will go away. Get medical help right away. Call your local emergency services (911 in the [place] Do not drive yourself to the hospital. If you have severely low blood sugar (severe hypoglycemia) and you cannot eat or drink, you may need glucagon. Make sure a family member or close friend knows how to check your blood sugar and how to give you glucagon. You may need to be treated in a hospital for this condition. Yellow zone If you have any of the following symptoms, your diabetes is not under control and you may need to make some changes: A blood sugar test result that is at or above 240 mg/dL (13.3 mmol/L) for 2 days in a  row. Blood sugar test results that are below 70 mg/dL (3.9 mmol/L). Other symptoms of hypoglycemia, such as: Shaking or feeling light-headed. Confusion or irritability. Feeling hungry. Having a fast heartbeat. If you have any yellow zone symptoms: Treat your hypoglycemia by eating or drinking 15 grams of a rapid-acting carbohydrate. Follow the 15:15 rule: Take 15 grams of a rapid-acting carbohydrate, such as: 1 tube of glucose gel. 4 glucose pills. 4 oz (120 mL) of fruit juice. 4 oz (120 mL) of regular (not diet) soda. Check your blood sugar 15 minutes after you take the carbohydrate. If the repeat blood sugar test is still at or below 70 mg/dL (3.9 mmol/L), take 15 grams of a carbohydrate again. If your blood sugar does not increase above 70 mg/dL (3.9 mmol/L) after 3 tries, get medical help right away. After your blood sugar returns to normal, eat a meal or a snack within 1 hour. Keep taking your daily medicines as told by your health care provider. Check your blood sugar more often than you normally would. Write down your results. Call your health care provider if you have trouble keeping your blood sugar in your target range.  Green zone These signs mean you are doing well and you can continue what you are doing to manage your diabetes: Your blood sugar is within your personal target range. For most people, a blood sugar level before a meal (preprandial) should be 80-130 mg/dL (4.4-7.2 mmol/L). You feel   well, and you are able to do daily activities. If you are in the green zone, continue to manage your diabetes as told by your health care provider. To do this: Eat a healthy diet. Exercise regularly. Check your blood sugar as told by your health care provider. Take your medicines as told by your health care provider.  Where to find more information American Diabetes Association (ADA): diabetes.org Association of Diabetes Care & Education Specialists (ADCES):  diabeteseducator.org Summary Following a diabetes action plan is a way for you to manage your diabetes symptoms. The plan is color-coded to help you understand what actions you need to take based on any symptoms you are having. Follow the plan that you develop with your health care provider. Make sure you know your personal target blood sugar level. Review your treatment plan with your health care provider at each visit. This information is not intended to replace advice given to you by your health care provider. Make sure you discuss any questions you have with your health care provider. Document Revised: 01/07/2020 Document Reviewed: 01/07/2020 Elsevier Patient Education  2023 [employer]vier Inc.  

## 2022-06-06 ENCOUNTER — Encounter: Payer: Self-pay | Admitting: Cardiology

## 2022-06-06 ENCOUNTER — Telehealth: Payer: Self-pay

## 2022-06-06 DIAGNOSIS — I25118 Atherosclerotic heart disease of native coronary artery with other forms of angina pectoris: Secondary | ICD-10-CM

## 2022-06-06 DIAGNOSIS — E782 Mixed hyperlipidemia: Secondary | ICD-10-CM

## 2022-06-06 LAB — CMP14 + ANION GAP
ALT: 21 IU/L (ref 0–32)
AST: 20 IU/L (ref 0–40)
Albumin/Globulin Ratio: 1.6 (ref 1.2–2.2)
Albumin: 4.6 g/dL (ref 3.8–4.9)
Alkaline Phosphatase: 77 IU/L (ref 44–121)
Anion Gap: 15 mmol/L (ref 10.0–18.0)
BUN/Creatinine Ratio: 14 (ref 9–23)
BUN: 11 mg/dL (ref 6–24)
Bilirubin Total: 0.7 mg/dL (ref 0.0–1.2)
CO2: 27 mmol/L (ref 20–29)
Calcium: 10.1 mg/dL (ref 8.7–10.2)
Chloride: 98 mmol/L (ref 96–106)
Creatinine, Ser: 0.76 mg/dL (ref 0.57–1.00)
Globulin, Total: 2.8 g/dL (ref 1.5–4.5)
Glucose: 122 mg/dL — ABNORMAL HIGH (ref 70–99)
Potassium: 4.7 mmol/L (ref 3.5–5.2)
Sodium: 140 mmol/L (ref 134–144)
Total Protein: 7.4 g/dL (ref 6.0–8.5)
eGFR: 92 mL/min/{1.73_m2} (ref >59)

## 2022-06-06 LAB — TSH: TSH: 1.91 u[IU]/mL (ref 0.450–4.500)

## 2022-06-06 LAB — HEMOGLOBIN A1C
Est. average glucose Bld gHb Est-mCnc: 166 mg/dL
Hgb A1c MFr Bld: 7.4 % — ABNORMAL HIGH (ref 4.8–5.6)

## 2022-06-06 LAB — LIPID PANEL
Chol/HDL Ratio: 4.2 ratio (ref 0.0–4.4)
Cholesterol, Total: 258 mg/dL — ABNORMAL HIGH (ref 100–199)
HDL: 61 mg/dL (ref >39)
LDL Chol Calc (NIH): 155 mg/dL — ABNORMAL HIGH (ref 0–99)
Triglycerides: 230 mg/dL — ABNORMAL HIGH (ref 0–149)
VLDL Cholesterol Cal: 42 mg/dL — ABNORMAL HIGH (ref 5–40)

## 2022-06-06 MED ORDER — REPATHA SURECLICK 140 MG/ML ~~LOC~~ SOAJ: 140.0000 mg | SUBCUTANEOUS | 5 refills | Status: DC

## 2022-06-06 NOTE — Telephone Encounter (Signed)
Sure

## 2022-06-06 NOTE — Telephone Encounter (Signed)
Patients insurance prefers repatha can you send repatha?

## 2022-06-13 ENCOUNTER — Other Ambulatory Visit: Payer: Self-pay

## 2022-06-13 DIAGNOSIS — E782 Mixed hyperlipidemia: Secondary | ICD-10-CM

## 2022-06-13 DIAGNOSIS — I25118 Atherosclerotic heart disease of native coronary artery with other forms of angina pectoris: Secondary | ICD-10-CM

## 2022-06-13 MED ORDER — REPATHA SURECLICK 140 MG/ML ~~LOC~~ SOAJ: 140.0000 mg | SUBCUTANEOUS | 5 refills | Status: DC

## 2022-06-15 ENCOUNTER — Other Ambulatory Visit: Payer: Self-pay | Admitting: Nurse Practitioner

## 2022-06-15 DIAGNOSIS — E782 Mixed hyperlipidemia: Secondary | ICD-10-CM

## 2022-06-15 DIAGNOSIS — E1169 Type 2 diabetes mellitus with other specified complication: Secondary | ICD-10-CM

## 2022-06-15 MED ORDER — DAPAGLIFLOZIN PROPANEDIOL 10 MG PO TABS: 10.0000 mg | ORAL_TABLET | Freq: Every day | ORAL | 2 refills | Status: DC

## 2022-07-12 ENCOUNTER — Encounter: Payer: Self-pay | Admitting: Cardiology

## 2022-07-12 ENCOUNTER — Other Ambulatory Visit: Payer: Self-pay

## 2022-07-12 ENCOUNTER — Encounter: Payer: Self-pay | Admitting: Nurse Practitioner

## 2022-07-12 DIAGNOSIS — R52 Pain, unspecified: Secondary | ICD-10-CM

## 2022-07-12 DIAGNOSIS — R232 Flushing: Secondary | ICD-10-CM

## 2022-07-12 DIAGNOSIS — E1169 Type 2 diabetes mellitus with other specified complication: Secondary | ICD-10-CM

## 2022-07-12 DIAGNOSIS — G4709 Other insomnia: Secondary | ICD-10-CM

## 2022-07-12 DIAGNOSIS — M7989 Other specified soft tissue disorders: Secondary | ICD-10-CM

## 2022-07-12 MED ORDER — VEOZAH 45 MG PO TABS: 1.0000 | ORAL_TABLET | Freq: Every day | ORAL | 3 refills | Status: DC

## 2022-07-12 MED ORDER — FUROSEMIDE 20 MG PO TABS: 20.0000 mg | ORAL_TABLET | Freq: Every day | ORAL | 2 refills | Status: DC

## 2022-07-12 MED ORDER — ONDANSETRON HCL 4 MG PO TABS: ORAL_TABLET | ORAL | 1 refills | Status: DC

## 2022-07-12 MED ORDER — HYDROCHLOROTHIAZIDE 25 MG PO TABS: 25.0000 mg | ORAL_TABLET | Freq: Every day | ORAL | 1 refills | Status: DC

## 2022-07-12 MED ORDER — DAPAGLIFLOZIN PROPANEDIOL 10 MG PO TABS: 10.0000 mg | ORAL_TABLET | Freq: Every day | ORAL | 2 refills | Status: DC

## 2022-07-18 ENCOUNTER — Other Ambulatory Visit: Payer: Self-pay | Admitting: Nurse Practitioner

## 2022-07-18 ENCOUNTER — Encounter: Payer: Self-pay | Admitting: Nurse Practitioner

## 2022-07-18 DIAGNOSIS — G4709 Other insomnia: Secondary | ICD-10-CM

## 2022-07-18 DIAGNOSIS — E1169 Type 2 diabetes mellitus with other specified complication: Secondary | ICD-10-CM

## 2022-07-18 DIAGNOSIS — M7989 Other specified soft tissue disorders: Secondary | ICD-10-CM

## 2022-07-18 DIAGNOSIS — R52 Pain, unspecified: Secondary | ICD-10-CM

## 2022-07-18 DIAGNOSIS — R232 Flushing: Secondary | ICD-10-CM

## 2022-07-18 MED ORDER — HYDROCHLOROTHIAZIDE 25 MG PO TABS: 25.0000 mg | ORAL_TABLET | Freq: Every day | ORAL | 1 refills | Status: DC

## 2022-07-18 MED ORDER — FUROSEMIDE 20 MG PO TABS: 20.0000 mg | ORAL_TABLET | Freq: Every day | ORAL | 2 refills | Status: DC

## 2022-07-18 MED ORDER — CELECOXIB 200 MG PO CAPS: 200.0000 mg | ORAL_CAPSULE | Freq: Every day | ORAL | 2 refills | Status: DC

## 2022-07-18 MED ORDER — DAPAGLIFLOZIN PROPANEDIOL 10 MG PO TABS: 10.0000 mg | ORAL_TABLET | Freq: Every day | ORAL | 2 refills | Status: DC

## 2022-07-18 MED ORDER — ONDANSETRON HCL 4 MG PO TABS: ORAL_TABLET | ORAL | 1 refills | Status: DC

## 2022-07-18 MED ORDER — NOREL AD 4-10-325 MG PO TABS: ORAL_TABLET | ORAL | 0 refills | Status: DC

## 2022-08-14 ENCOUNTER — Other Ambulatory Visit: Payer: Self-pay | Admitting: Cardiology

## 2022-08-14 DIAGNOSIS — I25118 Atherosclerotic heart disease of native coronary artery with other forms of angina pectoris: Secondary | ICD-10-CM

## 2022-08-31 ENCOUNTER — Other Ambulatory Visit: Payer: Self-pay | Admitting: Nurse Practitioner

## 2022-09-04 ENCOUNTER — Ambulatory Visit: Payer: Self-pay | Admitting: Nurse Practitioner

## 2022-10-09 ENCOUNTER — Encounter: Payer: Self-pay | Admitting: Nurse Practitioner

## 2022-10-09 ENCOUNTER — Ambulatory Visit: Payer: BC Managed Care – PPO | Admitting: Nurse Practitioner

## 2022-10-09 VITALS — BP 128/68 | HR 83 | Temp 98.2°F | Ht 66.0 in | Wt 204.0 lb

## 2022-10-09 DIAGNOSIS — E6609 Other obesity due to excess calories: Secondary | ICD-10-CM

## 2022-10-09 DIAGNOSIS — I119 Hypertensive heart disease without heart failure: Secondary | ICD-10-CM | POA: Diagnosis not present

## 2022-10-09 DIAGNOSIS — M609 Myositis, unspecified: Secondary | ICD-10-CM

## 2022-10-09 DIAGNOSIS — E785 Hyperlipidemia, unspecified: Secondary | ICD-10-CM

## 2022-10-09 DIAGNOSIS — E1169 Type 2 diabetes mellitus with other specified complication: Secondary | ICD-10-CM

## 2022-10-09 DIAGNOSIS — Z5320 Procedure and treatment not carried out because of patient's decision for unspecified reasons: Secondary | ICD-10-CM

## 2022-10-09 DIAGNOSIS — E782 Mixed hyperlipidemia: Secondary | ICD-10-CM

## 2022-10-09 DIAGNOSIS — T466X5A Adverse effect of antihyperlipidemic and antiarteriosclerotic drugs, initial encounter: Secondary | ICD-10-CM

## 2022-10-09 DIAGNOSIS — I10 Essential (primary) hypertension: Secondary | ICD-10-CM

## 2022-10-09 DIAGNOSIS — I25118 Atherosclerotic heart disease of native coronary artery with other forms of angina pectoris: Secondary | ICD-10-CM

## 2022-10-09 DIAGNOSIS — E669 Obesity, unspecified: Secondary | ICD-10-CM

## 2022-10-09 DIAGNOSIS — Z6832 Body mass index (BMI) 32.0-32.9, adult: Secondary | ICD-10-CM

## 2022-10-09 NOTE — Progress Notes (Signed)
I,Sheena H Holbrook,acting as a Education administrator for [name], FNP.,have documented all relevant documentation on the behalf of [name], FNP,as directed by  [name], FNP while in the presence of [name], [place].    Subjective:     Patient ID: Veronica Lynch , female    DOB: [birth year] , 57 y.o.   MRN: [id number]   Chief Complaint  Patient presents with   Medical Management of Chronic Issues    HPI  Patient presents today for DM and hypertension follow up. Patient has no other complaints or concerns. Patient declines COVID and all other vaccines at this time. She does admit to not exercising as much in the last 7 months due to her heart issues. She had a CVA October 2022, had stent placed later in that year.   Wt Readings from Last 3 Encounters: 10/09/22 : 204 lb (92.5 kg) 06/05/22 : 202 lb 9.6 oz (91.9 kg) 02/21/22 : 208 lb (94.3 kg)        Past Medical History:  Diagnosis Date   CVA (cerebral vascular accident) ([place]) 2018   CVA (cerebral vascular accident) ([place]) 2017   Diabetes ([place])    Hyperlipidemia    Hypertension    Shingles      Family History  Problem Relation Age of Onset   Hypertension Mother    Hyperlipidemia Mother    Osteoporosis Mother    Cancer Father    Sarcoidosis Sister    Diabetes Maternal Aunt      Current Outpatient Medications:    Ascorbic Acid (VITAMIN C) 1000 MG tablet, Take 1,000 mg by mouth daily., Disp: , Rfl:    ASPIRIN LOW DOSE 81 MG tablet, TAKE 1 TABLET (81 MG TOTAL) BY MOUTH DAILY. SWALLOW WHOLE., Disp: 90 tablet, Rfl: 3   Biotin 10 MG TABS, Take 10 mg by mouth daily., Disp: , Rfl:    celecoxib (CELEBREX) 200 MG capsule, Take 1 capsule (200 mg total) by mouth daily., Disp: 30 capsule, Rfl: 2   clopidogrel (PLAVIX) 75 MG tablet, Take 1 tablet (75 mg total) by mouth daily., Disp: 90 tablet, Rfl: 3   Evolocumab (REPATHA SURECLICK) XX123456 MG/ML SOAJ, Inject 140 mg into the skin every 14 (fourteen) days., Disp: 6 mL, Rfl: 5    fluticasone (FLONASE) 50 MCG/ACT nasal spray, Place 1 spray into both nostrils 2 (two) times daily., Disp: 16 g, Rfl: 0   furosemide (LASIX) 20 MG tablet, TAKE 1 TABLET (20 MG TOTAL) BY MOUTH DAILY. AS NEEDED FOR LEG SWELLING, Disp: 90 tablet, Rfl: 1   hydrochlorothiazide (HYDRODIURIL) 25 MG tablet, Take 1 tablet (25 mg total) by mouth daily. As needed for leg swelling, Disp: 90 tablet, Rfl: 1   metoprolol succinate (TOPROL-XL) 50 MG 24 hr tablet, TAKE 1 TABLET BY MOUTH EVERY DAY WITH OR IMMEDIATELY FOLLOWING A MEAL, Disp: 90 tablet, Rfl: 1   nitroGLYCERIN (NITROSTAT) 0.4 MG SL tablet, Place 0.4 mg under the tongue every 5 (five) minutes as needed for chest pain., Disp: , Rfl:    NOREL AD 4-10-325 MG TABS, TAKE 1 TABLET BY MOUTH DAILY, Disp: 30 tablet, Rfl: 0   ondansetron (ZOFRAN) 4 MG tablet, TAKE 1 TABLET BY MOUTH DAILY AS NEEDED FOR NAUSEA OR VOMITING., Disp: 18 tablet, Rfl: 1   TRUE METRIX BLOOD GLUCOSE TEST test strip, Check blood sugar once daily, Disp: , Rfl:    Allergies  Allergen Reactions   Bactrim [Sulfamethoxazole-Trimethoprim] Hives and Itching   Codeine Other (See Comments)  Pass out      Review of Systems  Constitutional: Negative.   Respiratory: Negative.    Cardiovascular: Negative.   Neurological: Negative.   Psychiatric/Behavioral: Negative.       Today's Vitals   10/09/22 1408  BP: 128/68  Pulse: 83  Temp: 98.2 F (36.8 C)  TempSrc: Oral  SpO2: 99%  Weight: 204 lb (92.5 kg)  Height: 5\' 6"  (1.676 m)   Body mass index is 32.93 kg/m.   Objective:  Physical Exam Vitals reviewed.  Constitutional:      General: She is not in acute distress.    Appearance: Normal appearance. She is obese.  Cardiovascular:     Rate and Rhythm: Normal rate and regular rhythm.     Pulses: Normal pulses.     Heart sounds: Normal heart sounds. No murmur heard. Pulmonary:     Effort: Pulmonary effort is normal. No respiratory distress.     Breath sounds: Normal breath  sounds. No wheezing.  Skin:    General: Skin is warm and dry.     Capillary Refill: Capillary refill takes less than 2 seconds.  Neurological:     General: No focal deficit present.     Mental Status: She is alert and oriented to person, place, and time.     Cranial Nerves: No cranial nerve deficit.     Motor: No weakness.         Assessment And Plan:     1. Diabetes mellitus type 2 in obese [facility]) Comments: hgbA1c was 7.4 patient declined Jardiance, this will help with her diabetes and cardiac health. - Hemoglobin A1c - Microalbumin / Creatinine Urine Ratio  2. Essential hypertension Comments: Blood pressure is well controlled, continue current medications.  3. Mixed hyperlipidemia Comments: Unable to tolerate statin, continue Repatha. - Lipid panel - CMP14+EGFR  4. Coronary artery disease of native artery of native heart with stable angina pectoris [facility]) Comments: Continue f/u with Cardiology  5. Class 1 obesity due to excess calories without serious comorbidity with body mass index (BMI) of 32.0 to 32.9 in adult  6. Colon cancer screening declined Comments: Discussed risk of Colon Cancer and importance of screening, offered cologuard declined  7. Statin-induced myositis Comments: Unable to tolerate statin.     Patient was given opportunity to ask questions. Patient verbalized understanding of the plan and was able to repeat key elements of the plan. All questions were answered to their satisfaction.  [name], FNP   I, [name], FNP, have reviewed all documentation for this visit. The documentation on 10/09/22 for the exam, diagnosis, procedures, and orders are all accurate and complete.  IF YOU HAVE BEEN REFERRED TO A SPECIALIST, IT MAY TAKE 1-2 WEEKS TO SCHEDULE/PROCESS THE REFERRAL. IF YOU HAVE NOT HEARD FROM US/SPECIALIST IN TWO WEEKS, PLEASE GIVE [place] A CALL AT [phone].   THE PATIENT IS ENCOURAGED TO PRACTICE SOCIAL DISTANCING DUE TO THE  COVID-19 PANDEMIC.

## 2022-10-10 LAB — LIPID PANEL
Chol/HDL Ratio: 3.8 ratio (ref 0.0–4.4)
Cholesterol, Total: 212 mg/dL — ABNORMAL HIGH (ref 100–199)
HDL: 56 mg/dL (ref >39)
LDL Chol Calc (NIH): 116 mg/dL — ABNORMAL HIGH (ref 0–99)
Triglycerides: 228 mg/dL — ABNORMAL HIGH (ref 0–149)
VLDL Cholesterol Cal: 40 mg/dL (ref 5–40)

## 2022-10-10 LAB — CMP14+EGFR
ALT: 22 IU/L (ref 0–32)
AST: 20 IU/L (ref 0–40)
Albumin/Globulin Ratio: 1.8 (ref 1.2–2.2)
Albumin: 4.8 g/dL (ref 3.8–4.9)
Alkaline Phosphatase: 85 IU/L (ref 44–121)
BUN/Creatinine Ratio: 16 (ref 9–23)
BUN: 11 mg/dL (ref 6–24)
Bilirubin Total: 0.6 mg/dL (ref 0.0–1.2)
CO2: 25 mmol/L (ref 20–29)
Calcium: 9.9 mg/dL (ref 8.7–10.2)
Chloride: 97 mmol/L (ref 96–106)
Creatinine, Ser: 0.69 mg/dL (ref 0.57–1.00)
Globulin, Total: 2.7 g/dL (ref 1.5–4.5)
Glucose: 131 mg/dL — ABNORMAL HIGH (ref 70–99)
Potassium: 4.2 mmol/L (ref 3.5–5.2)
Sodium: 139 mmol/L (ref 134–144)
Total Protein: 7.5 g/dL (ref 6.0–8.5)
eGFR: 101 mL/min/{1.73_m2} (ref >59)

## 2022-10-10 LAB — MICROALBUMIN / CREATININE URINE RATIO
Creatinine, Urine: 50.4 mg/dL
Microalb/Creat Ratio: <6 mg/g creat (ref 0–29)
Microalbumin, Urine: <3 ug/mL

## 2022-10-10 LAB — HEMOGLOBIN A1C
Est. average glucose Bld gHb Est-mCnc: 177 mg/dL
Hgb A1c MFr Bld: 7.8 % — ABNORMAL HIGH (ref 4.8–5.6)

## 2022-10-21 ENCOUNTER — Encounter: Payer: Self-pay | Admitting: Nurse Practitioner

## 2022-11-02 ENCOUNTER — Other Ambulatory Visit: Payer: Self-pay

## 2022-11-02 ENCOUNTER — Encounter: Payer: Self-pay | Admitting: Nurse Practitioner

## 2022-11-06 ENCOUNTER — Other Ambulatory Visit: Payer: Self-pay | Admitting: Nurse Practitioner

## 2022-11-06 DIAGNOSIS — R52 Pain, unspecified: Secondary | ICD-10-CM

## 2022-11-06 DIAGNOSIS — E1169 Type 2 diabetes mellitus with other specified complication: Secondary | ICD-10-CM

## 2022-11-06 MED ORDER — EMPAGLIFLOZIN 25 MG PO TABS
25.0000 mg | ORAL_TABLET | Freq: Every day | ORAL | 2 refills | Status: DC
Start: 2022-11-06 — End: 2023-01-16

## 2022-12-05 ENCOUNTER — Encounter: Payer: Self-pay | Admitting: Cardiology

## 2022-12-05 ENCOUNTER — Ambulatory Visit: Payer: BC Managed Care – PPO | Admitting: Cardiology

## 2022-12-05 VITALS — BP 115/64 | HR 84 | Ht 66.0 in | Wt 196.0 lb

## 2022-12-05 DIAGNOSIS — I10 Essential (primary) hypertension: Secondary | ICD-10-CM

## 2022-12-05 DIAGNOSIS — I25118 Atherosclerotic heart disease of native coronary artery with other forms of angina pectoris: Secondary | ICD-10-CM

## 2022-12-05 DIAGNOSIS — E782 Mixed hyperlipidemia: Secondary | ICD-10-CM

## 2022-12-05 MED ORDER — ISOSORBIDE MONONITRATE ER 30 MG PO TB24: 30.0000 mg | ORAL_TABLET | Freq: Every day | ORAL | 3 refills | Status: DC

## 2022-12-05 NOTE — Progress Notes (Signed)
Patient referred by [name], FNP for hypertension  Subjective:   Veronica Lynch, female    DOB: Feb 17, 1966, 57 y.o.   MRN: [id number]   Chief Complaint  Patient presents with   Coronary Artery Disease   Follow-up    57 y.o. African American female with hypertension, mixed hyperlipidemia, h/o stroke (2018), CAD with stable angina  Patient has recently had exertional dyspnea and chest tightness at the top of the [place]. She has had to take SL NTG. Symptoms are similar to prior to her PCI in 05/2021. Reviewed recent test results with the patient, details below. It appears that she had been out of Repatha due to insurance issues, is back on it now.    Initial consultation HPI 11/2020: Patient works as an Environmental health practitioner at [employer].  She moved from [place] to [place].  Prior to that, she lived in [place].  In 2017, patient had a stroke, fortunately with no significant residual deficit, following a lap band procedure. She was admitted at [facility] and then transferred to [facility] the [facility] at [facility] [address]  More recently, she is experienced retrosternal chest pain and shortness of breath with walking.  She used to walk up to 10 miles a week, but this is down significantly in the last few months due to the above symptoms.  She denies any presyncope syncope, orthopnea, PND.  She has occasional leg edema.  Her sister has noticed that patient's heart rate has been irregular.  Patient had an EKG performed at her PCP office, copy of which is not legible.  I will request another copy.    Current Outpatient Medications:    empagliflozin (JARDIANCE) 25 MG TABS tablet, Take 1 tablet (25 mg total) by mouth daily before breakfast., Disp: 30 tablet, Rfl: 2   Ascorbic Acid (VITAMIN C) 1000 MG tablet, Take 1,000 mg by mouth daily., Disp: , Rfl:    ASPIRIN LOW DOSE 81 MG tablet, TAKE 1 TABLET (81 MG TOTAL) BY MOUTH DAILY. SWALLOW WHOLE., Disp: 90 tablet,  Rfl: 3   Biotin 10 MG TABS, Take 10 mg by mouth daily., Disp: , Rfl:    celecoxib (CELEBREX) 200 MG capsule, TAKE 1 CAPSULE BY MOUTH EVERY DAY, Disp: 30 capsule, Rfl: 2   Evolocumab (REPATHA SURECLICK) 140 MG/ML SOAJ, Inject 140 mg into the skin every 14 (fourteen) days., Disp: 6 mL, Rfl: 5   fluticasone (FLONASE) 50 MCG/ACT nasal spray, Place 1 spray into both nostrils 2 (two) times daily., Disp: 16 g, Rfl: 0   furosemide (LASIX) 20 MG tablet, TAKE 1 TABLET (20 MG TOTAL) BY MOUTH DAILY. AS NEEDED FOR LEG SWELLING, Disp: 90 tablet, Rfl: 1   hydrochlorothiazide (HYDRODIURIL) 25 MG tablet, Take 1 tablet (25 mg total) by mouth daily. As needed for leg swelling, Disp: 90 tablet, Rfl: 1   metoprolol succinate (TOPROL-XL) 50 MG 24 hr tablet, TAKE 1 TABLET BY MOUTH EVERY DAY WITH OR IMMEDIATELY FOLLOWING A MEAL, Disp: 90 tablet, Rfl: 1   nitroGLYCERIN (NITROSTAT) 0.4 MG SL tablet, Place 0.4 mg under the tongue every 5 (five) minutes as needed for chest pain., Disp: , Rfl:    NOREL AD 4-10-325 MG TABS, TAKE 1 TABLET BY MOUTH DAILY, Disp: 30 tablet, Rfl: 0   ondansetron (ZOFRAN) 4 MG tablet, TAKE 1 TABLET BY MOUTH DAILY AS NEEDED FOR NAUSEA OR VOMITING., Disp: 18 tablet, Rfl: 1   TRUE METRIX BLOOD GLUCOSE TEST test strip, Check blood sugar once  daily, Disp: , Rfl:   Cardiovascular and other pertinent studies:  EKG 12/05/2022: Sinus rhythm 89 bpm Nonspecific T-abnormality  Coronary intervention 05/16/2021: LM: Normal LAD: 40% prox LAD, 50% prox diag disease         LAD wraps around apex and gives left-to-right collaterals to distal RCA Lcx: Prox focal 80% stenosis        Left-to-right collaterals to distal RCA RCA: Prox subtotal occlusion, followed by mid 100% occlusion         Collateralized up to mid RCA by LAD and Lcx   Successful OCT guided percutaneous coronary intervention prox Lcx        PTCA and stent placement 2.0 X 15 mm Onyx Frontier drug-eluting stent        Post dilatation using 2.25  mm balloon at 16 atm        80%---0% residual stenosis        112% stent expansion, MSA 3.6 mm2  CT Cardiac scoring 11/28/2020: LM: 0 LAD: 11 LCx: 10 RCA: 0 Total score: 21 (88th percentile)  Mobile cardiac telemetry 5 days 11/23/2020 - 11/28/2020: Dominant rhythm: Sinus. HR 60-135 bpm. Avg HR 91 bpm. 0 episodes of SVT 3.3% isolated SVE, 1.7% couplets, <1%triplets. 0 episodes of VT <% isolated VE, no couplet/triplets. No atrial fibrillation/atrial flutter/SVT/VT/high grade AV block, sinus pause >3sec noted. 0 patient triggered events.   Echocardiogram 11/23/2020:  Normal LV systolic function with visual EF 60-65%. Left ventricle cavity  is normal in size. Normal global wall motion. Normal diastolic filling  pattern, normal LAP.  Trace tricuspid regurgitation. No evidence of pulmonary hypertension.  No prior study for comparison.  Exercise Sestamibi stress test 11/21/2020: Exercise nuclear stress test was performed using Bruce protocol. Patient reached 7.3 METS, and 82% of age predicted maximum heart rate. Exercise capacity was low. No chest pain reported. Exertional dyspnea and dizziness reported. Heart rate and hemodynamic response were normal. Stress EKG revealed no ischemic changes. SPECT images showed small size, mild intensity, reversible perfusion defect in apical to basal, inferior myocardium.  Stress LVEF 62%. Low risk study.  11/2016 Acute CVA s/p TPA   MRI brain 11/2016:  Normal   CTA head/neck 11/2016: No carotid stenosis Small thrombus noted in Rt M2 segment, too small for safe retrieval   Recent labs: 10/09/2022: Glucose 131, BUN/Cr 11/0.69. EGFR 101. Na/K 139/4.2. Rest of the CMP normal HbA1C 7.8% Chol 212, TG 228, HDL 56, LDL 116 TSH 1.9 normal  02/21/2022: H/H 13/37. MCV 86. Platelets 235  05/17/2021: Glucose 134, BUN/Cr 11/0.72. EGFR >60. Na/K 141/3.7. Rest of the CMP normal H/H 11/33. MCV 86. Platelets 189 HbA1C 6.3% Chol 130, TG 61, HDL 46, LDL 72 TSH  NA  10/22/2020: Glucose 113, BUN/Cr 11/0.70. EGFR 98. Na/K 141/4.1. Rest of the CMP normal H/H 12.5/37.8 MCV 85.7. Platelets 246 HbA1C 7.0% Chol 247, TG 184, HDL 57, LDL 156 TSH 0.92 normal    Review of Systems  Constitutional: Positive for malaise/fatigue.  Cardiovascular:  Positive for chest pain (Only one episode). Negative for dyspnea on exertion, leg swelling, palpitations and syncope.         Vitals:   12/05/22 1341  BP: 115/64  Pulse: 84  SpO2: 97%    Body mass index is 31.64 kg/m. Filed Weights   12/05/22 1341  Weight: 196 lb (88.9 kg)     Objective:   Physical Exam Vitals and nursing note reviewed.  Constitutional:      General: She is not in acute  distress. Neck:     Vascular: No JVD.  Cardiovascular:     Rate and Rhythm: Normal rate and regular rhythm.     Pulses: Normal pulses.     Heart sounds: Normal heart sounds. No murmur heard. Pulmonary:     Effort: Pulmonary effort is normal.     Breath sounds: Normal breath sounds. No wheezing or rales.  Musculoskeletal:     Right lower leg: No edema.     Left lower leg: No edema.         Assessment & Recommendations:   57 y.o. African American female with hypertension, mixed hyperlipidemia, h/o stroke (2018), CAD s/p pLCx PCI, known RCA CTO  CAD: RCA CTO. Successful pLCx PCI (04/2021) Recent exertional angina and dyspnea symptoms. Check exercise nuclear stress test, echocardiogram. Completed 6 months DAPT. Tolerating Plavix monotherapy. Chol 212, TG 228, HDL 56, LDL 116 (09/2022) Likely uncontrolled due to not being on Repatha.She is back on it now. Continue metoprolol succinate 50 mg daily. Added Imdur 30 mg daily.  Hypertension: Controlled  Mixed hyperlipidemia: Management as above  F/u in 4 weeks    Elder Negus, MD Pager: [phone] Office: [phone]

## 2022-12-11 ENCOUNTER — Ambulatory Visit: Payer: BC Managed Care – PPO

## 2022-12-11 DIAGNOSIS — I25118 Atherosclerotic heart disease of native coronary artery with other forms of angina pectoris: Secondary | ICD-10-CM

## 2022-12-14 ENCOUNTER — Ambulatory Visit: Payer: BC Managed Care – PPO | Admitting: Dietician

## 2022-12-15 ENCOUNTER — Other Ambulatory Visit: Payer: Self-pay | Admitting: Nurse Practitioner

## 2022-12-15 ENCOUNTER — Other Ambulatory Visit: Payer: Self-pay | Admitting: Cardiology

## 2022-12-15 DIAGNOSIS — I25118 Atherosclerotic heart disease of native coronary artery with other forms of angina pectoris: Secondary | ICD-10-CM

## 2023-01-08 ENCOUNTER — Ambulatory Visit: Payer: BC Managed Care – PPO | Admitting: Nurse Practitioner

## 2023-01-09 ENCOUNTER — Ambulatory Visit: Payer: BC Managed Care – PPO

## 2023-01-09 DIAGNOSIS — I25118 Atherosclerotic heart disease of native coronary artery with other forms of angina pectoris: Secondary | ICD-10-CM

## 2023-01-16 ENCOUNTER — Ambulatory Visit: Payer: BC Managed Care – PPO | Admitting: Cardiology

## 2023-01-16 ENCOUNTER — Encounter: Payer: Self-pay | Admitting: Cardiology

## 2023-01-16 VITALS — BP 135/58 | HR 80 | Ht 66.0 in | Wt 204.0 lb

## 2023-01-16 DIAGNOSIS — E782 Mixed hyperlipidemia: Secondary | ICD-10-CM

## 2023-01-16 DIAGNOSIS — I10 Essential (primary) hypertension: Secondary | ICD-10-CM

## 2023-01-16 DIAGNOSIS — I25118 Atherosclerotic heart disease of native coronary artery with other forms of angina pectoris: Secondary | ICD-10-CM

## 2023-01-16 MED ORDER — NITROGLYCERIN 0.4 MG SL SUBL: 0.4000 mg | SUBLINGUAL_TABLET | SUBLINGUAL | 2 refills | Status: AC | PRN

## 2023-01-16 NOTE — Progress Notes (Signed)
Patient referred by [name], FNP for hypertension  Subjective:   Veronica Lynch, female    DOB: 06-08-1966, 57 y.o.   MRN: [id number]   Chief Complaint  Patient presents with   Coronary Artery Disease   Follow-up   Results    57 y.o. African American female with hypertension, mixed hyperlipidemia, h/o stroke (2018), CAD with stable angina  Reviewed recent test results with the patient, details below.  Patient still has episodes of chest pain from time to time, with exertion, but not limiting.  She still able to do some water aerobics, walking, as well as stationary bike.  Imdur 30 mg has not made significant difference.  Chest pain is better than what it was 2 years ago even after the PCI.   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  Prior to that, she lived in [place].  In 2017, patient had a stroke, fortunately with no significant residual deficit, following a lap band procedure. She was admitted at [facility] and then transferred to [facility] the [facility].  More recently, she is experienced retrosternal chest pain and shortness of breath with walking.  She used to walk up to 10 miles a week, but this is down significantly in the last few months due to the above symptoms.  She denies any presyncope syncope, orthopnea, PND.  She has occasional leg edema.  Her sister has noticed that patient's heart rate has been irregular.  Patient had an EKG performed at her PCP office, copy of which is not legible.  I will request another copy.    Current Outpatient Medications:    Ascorbic Acid (VITAMIN C) 1000 MG tablet, Take 1,000 mg by mouth daily., Disp: , Rfl:    ASPIRIN LOW DOSE 81 MG tablet, TAKE 1 TABLET (81 MG TOTAL) BY MOUTH DAILY. SWALLOW WHOLE. (Patient not taking: Reported on 12/05/2022), Disp: 90 tablet, Rfl: 3   Biotin 10 MG TABS, Take 10 mg by  mouth daily., Disp: , Rfl:    celecoxib (CELEBREX) 200 MG capsule, TAKE 1 CAPSULE BY MOUTH EVERY DAY (Patient taking differently: Take 200 mg by mouth as needed.), Disp: 30 capsule, Rfl: 2   clopidogrel (PLAVIX) 75 MG tablet, Take 75 mg by mouth daily., Disp: , Rfl:    empagliflozin (JARDIANCE) 25 MG TABS tablet, Take 1 tablet (25 mg total) by mouth daily before breakfast., Disp: 30 tablet, Rfl: 2   Evolocumab (REPATHA SURECLICK) 140 MG/ML SOAJ, Inject 140 mg into the skin every 14 (fourteen) days., Disp: 6 mL, Rfl: 5   fluticasone (FLONASE) 50 MCG/ACT nasal spray, Place 1 spray into both nostrils 2 (two) times daily., Disp: 16 g, Rfl: 0   furosemide (LASIX) 20 MG tablet, TAKE 1 TABLET (20 MG TOTAL) BY MOUTH DAILY. AS NEEDED FOR LEG SWELLING, Disp: 90 tablet, Rfl: 1   hydrochlorothiazide (HYDRODIURIL) 25 MG tablet, Take 1 tablet (25 mg total) by mouth daily. As needed for leg swelling, Disp: 90 tablet, Rfl: 1   isosorbide mononitrate (IMDUR) 30 MG 24 hr tablet, TAKE 1 TABLET BY MOUTH EVERY DAY, Disp: 90 tablet, Rfl: 1   metoprolol succinate (TOPROL-XL) 50 MG 24 hr tablet, TAKE 1 TABLET BY MOUTH EVERY DAY WITH OR IMMEDIATELY FOLLOWING A MEAL, Disp: 90 tablet, Rfl: 1   nitroGLYCERIN (NITROSTAT) 0.4 MG SL tablet, Place 0.4 mg under the tongue every 5 (five) minutes  as needed for chest pain., Disp: , Rfl:    NOREL AD 4-10-325 MG TABS, TAKE 1 TABLET BY MOUTH DAILY, Disp: 30 tablet, Rfl: 0   ondansetron (ZOFRAN) 4 MG tablet, TAKE 1 TABLET BY MOUTH DAILY AS NEEDED FOR NAUSEA OR VOMITING., Disp: 18 tablet, Rfl: 1   TRUE METRIX BLOOD GLUCOSE TEST test strip, Check blood sugar once daily, Disp: , Rfl:   Cardiovascular and other pertinent studies:  EKG 12/05/2022: Sinus rhythm 89 bpm Nonspecific T-abnormality  Echocardiogram 01/09/2023:  Left ventricle cavity is normal in size. Mild concentric hypertrophy of  the left ventricle. Normal global wall motion. Normal LV systolic function  with EF 56%. Doppler  evidence of grade I (impaired) diastolic dysfunction,  normal LAP.  No significant valvular abnormality.  No evidence of pulmonary hypertension.  Previous study on 11/23/2020 reported normal diastolic function.   Regadenoson (with Mod Bruce protocol) Nuclear stress test 12/11/2022: Soft tissue attenuation artifact in the inferior wall. No ischemia or scar noted.   Overall LV systolic function is normal without regional wall motion abnormalities. Stress LV EF: 61%.  Nondiagnostic ECG stress. No ST-T changes of ischemia at 81% MPHR. The heart rate response was consistent with Regadenoson. Patient experienced nausea and chest pain wit infusion.  Personally compared with previous stress test in 2022 that had showed mild intensity reversible perfusion defect in apical to basal inferior myocardium.  Since reversible defect not seen on recent study, but noted to have likely attenuation artifact that is more prominent in rest images than stress images.     Coronary intervention 05/16/2021: LM: Normal LAD: 40% prox LAD, 50% prox diag disease         LAD wraps around apex and gives left-to-right collaterals to distal RCA Lcx: Prox focal 80% stenosis        Left-to-right collaterals to distal RCA RCA: Prox subtotal occlusion, followed by mid 100% occlusion         Collateralized up to mid RCA by LAD and Lcx   Successful OCT guided percutaneous coronary intervention prox Lcx        PTCA and stent placement 2.0 X 15 mm Onyx Frontier drug-eluting stent        Post dilatation using 2.25 mm balloon at 16 atm        80%---0% residual stenosis        112% stent expansion, MSA 3.6 mm2  CT Cardiac scoring 11/28/2020: LM: 0 LAD: 11 LCx: 10 RCA: 0 Total score: 21 (88th percentile)  Mobile cardiac telemetry 5 days 11/23/2020 - 11/28/2020: Dominant rhythm: Sinus. HR 60-135 bpm. Avg HR 91 bpm. 0 episodes of SVT 3.3% isolated SVE, 1.7% couplets, <1%triplets. 0 episodes of VT <% isolated VE, no  couplet/triplets. No atrial fibrillation/atrial flutter/SVT/VT/high grade AV block, sinus pause >3sec noted. 0 patient triggered events.    Recent labs: 10/09/2022: Glucose 131, BUN/Cr 11/0.69. EGFR 101. Na/K 139/4.2. Rest of the CMP normal HbA1C 7.8% Chol 212, TG 228, HDL 56, LDL 116 TSH 1.9 normal    Review of Systems  Constitutional: Positive for malaise/fatigue.  Cardiovascular:  Positive for chest pain. Negative for dyspnea on exertion, leg swelling, palpitations and syncope.         Vitals:   01/16/23 0827  BP: (!) 135/58  Pulse: 80    Body mass index is 32.93 kg/m. Filed Weights   01/16/23 0827  Weight: 204 lb (92.5 kg)     Objective:   Physical Exam Vitals and nursing note reviewed.  Constitutional:      General: She is not in acute distress. Neck:     Vascular: No JVD.  Cardiovascular:     Rate and Rhythm: Normal rate and regular rhythm.     Pulses: Normal pulses.     Heart sounds: Normal heart sounds. No murmur heard. Pulmonary:     Effort: Pulmonary effort is normal.     Breath sounds: Normal breath sounds. No wheezing or rales.  Musculoskeletal:     Right lower leg: No edema.     Left lower leg: No edema.         Assessment & Recommendations:   57 y.o. African American female with hypertension, mixed hyperlipidemia, h/o stroke (2018), CAD s/p pLCx PCI, known RCA CTO  CAD: RCA CTO. Successful pLCx PCI (04/2021) Recent exertional angina and dyspnea symptoms. No ischemia on stress testing (11/2022), normal EF, mild LVH, grade 1 DD (echocardiogram 12/2022). Currently on metoprolol succinate 50 mg daily, Imdur 30 mg daily. We discussed increasing the dose of Imdur.  However, Imdur 30 mg has not helped her chest pain March, May meaning inadequate dose or nonanginal pain.  She would like to use sublingual nitroglycerin as needed at this time, and consider increasing the dose of Imdur if she has >2 episodes of chest pain requiring sublingual  nitroglycerin use. If there is increasing requirement artery stent, will then consider coronary angiogram. Completed 6 months DAPT. Tolerating Plavix monotherapy. Chol 212, TG 228, HDL 56, LDL 116 (09/2022) Likely uncontrolled due to not being on Repatha.She is back on it now. Repeat lipid panel in 03/2023.  Hypertension: Controlled  Mixed hyperlipidemia: Management as above  F/u in 3 months    Elder Negus, MD Pager: [phone] Office: [phone]

## 2023-01-18 ENCOUNTER — Ambulatory Visit: Payer: BC Managed Care – PPO | Admitting: Cardiology

## 2023-01-30 ENCOUNTER — Ambulatory Visit: Payer: BC Managed Care – PPO | Admitting: Nurse Practitioner

## 2023-01-30 ENCOUNTER — Encounter: Payer: Self-pay | Admitting: Nurse Practitioner

## 2023-01-30 VITALS — BP 136/70 | HR 81 | Temp 98.1°F | Ht 66.0 in | Wt 198.6 lb

## 2023-01-30 DIAGNOSIS — Z1231 Encounter for screening mammogram for malignant neoplasm of breast: Secondary | ICD-10-CM

## 2023-01-30 DIAGNOSIS — Z0120 Encounter for dental examination and cleaning without abnormal findings: Secondary | ICD-10-CM

## 2023-01-30 DIAGNOSIS — E6609 Other obesity due to excess calories: Secondary | ICD-10-CM

## 2023-01-30 DIAGNOSIS — Z6832 Body mass index (BMI) 32.0-32.9, adult: Secondary | ICD-10-CM

## 2023-01-30 DIAGNOSIS — I25118 Atherosclerotic heart disease of native coronary artery with other forms of angina pectoris: Secondary | ICD-10-CM | POA: Diagnosis not present

## 2023-01-30 DIAGNOSIS — E782 Mixed hyperlipidemia: Secondary | ICD-10-CM

## 2023-01-30 DIAGNOSIS — I119 Hypertensive heart disease without heart failure: Secondary | ICD-10-CM

## 2023-01-30 DIAGNOSIS — E1169 Type 2 diabetes mellitus with other specified complication: Secondary | ICD-10-CM

## 2023-01-30 DIAGNOSIS — I10 Essential (primary) hypertension: Secondary | ICD-10-CM

## 2023-01-30 DIAGNOSIS — E785 Hyperlipidemia, unspecified: Secondary | ICD-10-CM

## 2023-01-30 DIAGNOSIS — M609 Myositis, unspecified: Secondary | ICD-10-CM

## 2023-01-30 DIAGNOSIS — T466X5A Adverse effect of antihyperlipidemic and antiarteriosclerotic drugs, initial encounter: Secondary | ICD-10-CM

## 2023-01-30 MED ORDER — TRUE METRIX BLOOD GLUCOSE TEST VI STRP: 1.0000 | ORAL_STRIP | Freq: Every day | 3 refills | Status: DC

## 2023-01-30 MED ORDER — EMPAGLIFLOZIN 25 MG PO TABS: 25.0000 mg | ORAL_TABLET | Freq: Every day | ORAL | 1 refills | Status: DC

## 2023-01-30 NOTE — Assessment & Plan Note (Addendum)
Hemoglobin A1c was slightly improved at last visit.  She has been without Jardiance for 2 months due to back order per pharmacy, will have some available tomorrow.  Given samples in the event she needs a prior authorization.  I have not received anything at this time.

## 2023-01-30 NOTE — Assessment & Plan Note (Signed)
Would like a referral to a dentist for her routine dental care.

## 2023-01-30 NOTE — Assessment & Plan Note (Signed)
Continue follow-up with cardiology 

## 2023-01-30 NOTE — Assessment & Plan Note (Signed)
She is encouraged to strive for BMI less than 30 to decrease cardiac risk. Advised to aim for at least 150 minutes of exercise per week.  

## 2023-01-30 NOTE — Assessment & Plan Note (Signed)
Pt instructed on Self Breast Exam.According to ACOG guidelines Women aged 58 and older are recommended to get an annual mammogram.  [facility] and [facility] for appointment

## 2023-01-30 NOTE — Assessment & Plan Note (Signed)
Cholesterol levels are elevated, statin intolerance.  Continue with Repatha given by cardiology.

## 2023-01-30 NOTE — Assessment & Plan Note (Addendum)
Blood pressure is fairly controlled.  Continue lifestyle modifications.  Continue current medications and was recently started on Imdur by cardiology

## 2023-01-30 NOTE — Progress Notes (Addendum)
Veronica Lynch, CMA,acting as a Neurosurgeon for Veronica Felts, FNP.,have documented all relevant documentation on the behalf of Veronica Felts, FNP,as directed by  Veronica Felts, FNP while in the presence of Veronica Felts, FNP.  Subjective:  Patient ID: Veronica Lynch , female    DOB: 12/30/1965 , 57 y.o.   MRN: [id number]  Chief Complaint  Patient presents with   Hypertension    HPI  Patient presents today for a BP, and DM follow up. Patient reports compliance with medications, patient denies any chest pains, SOB, or headaches. Patient has no other concerns today. She has seen Cardiology who started her on Imdur, next appt in October.  She has been out of Jardiance for the last 2 months reports did not have refills.  BP Readings from Last 3 Encounters: 01/30/23 : 136/70 01/16/23 : (!) 135/58 12/05/22 : 115/64    Diabetes She presents for her follow-up diabetic visit. She has type 2 diabetes mellitus. There are no hypoglycemic associated symptoms. There are no diabetic associated symptoms. Pertinent negatives for diabetes include no fatigue. There are no hypoglycemic complications. Diabetic complications include a CVA and heart disease. Risk factors for coronary artery disease include obesity, hypertension and diabetes mellitus. Current diabetic treatment includes diet. She is following a generally healthy diet. She has not had a previous visit with a dietitian. She participates in exercise daily. (She did not bring her glucometer to share her readings.) An ACE inhibitor/angiotensin II receptor blocker is being taken. She does not see a podiatrist.Eye exam is current.     Past Medical History:  Diagnosis Date   CVA (cerebral vascular accident) (HCC) 2018   CVA (cerebral vascular accident) (HCC) 2017   Diabetes (HCC)    Hyperlipidemia    Hypertension    Shingles      Family History  Problem Relation Age of Onset   Hypertension Mother    Hyperlipidemia Mother    Osteoporosis Mother     Cancer Father    Sarcoidosis Sister    Diabetes Maternal Aunt      Current Outpatient Medications:    Ascorbic Acid (VITAMIN C) 1000 MG tablet, Take 1,000 mg by mouth daily., Disp: , Rfl:    Biotin 10 MG TABS, Take 10 mg by mouth daily., Disp: , Rfl:    clopidogrel (PLAVIX) 75 MG tablet, Take 75 mg by mouth daily., Disp: , Rfl:    Evolocumab (REPATHA SURECLICK) 140 MG/ML SOAJ, Inject 140 mg into the skin every 14 (fourteen) days., Disp: 6 mL, Rfl: 5   fluticasone (FLONASE) 50 MCG/ACT nasal spray, Place 1 spray into both nostrils 2 (two) times daily., Disp: 16 g, Rfl: 0   furosemide (LASIX) 20 MG tablet, TAKE 1 TABLET (20 MG TOTAL) BY MOUTH DAILY. AS NEEDED FOR LEG SWELLING, Disp: 90 tablet, Rfl: 1   hydrochlorothiazide (HYDRODIURIL) 25 MG tablet, Take 1 tablet (25 mg total) by mouth daily. As needed for leg swelling, Disp: 90 tablet, Rfl: 1   isosorbide mononitrate (IMDUR) 30 MG 24 hr tablet, TAKE 1 TABLET BY MOUTH EVERY DAY, Disp: 90 tablet, Rfl: 1   metoprolol succinate (TOPROL-XL) 50 MG 24 hr tablet, TAKE 1 TABLET BY MOUTH EVERY DAY WITH OR IMMEDIATELY FOLLOWING A MEAL, Disp: 90 tablet, Rfl: 1   nitroGLYCERIN (NITROSTAT) 0.4 MG SL tablet, Place 1 tablet (0.4 mg total) under the tongue every 5 (five) minutes as needed for chest pain., Disp: 30 tablet, Rfl: 2   NOREL AD 4-10-325 MG TABS, TAKE 1  TABLET BY MOUTH DAILY, Disp: 30 tablet, Rfl: 0   ondansetron (ZOFRAN) 4 MG tablet, TAKE 1 TABLET BY MOUTH DAILY AS NEEDED FOR NAUSEA OR VOMITING., Disp: 18 tablet, Rfl: 1   celecoxib (CELEBREX) 200 MG capsule, TAKE 1 CAPSULE BY MOUTH EVERY DAY, Disp: 30 capsule, Rfl: 2   empagliflozin (JARDIANCE) 25 MG TABS tablet, Take 1 tablet (25 mg total) by mouth daily before breakfast., Disp: 90 tablet, Rfl: 1   potassium chloride SA (KLOR-CON M) 20 MEQ tablet, Take 1 tablet (20 mEq total) by mouth daily., Disp: 3 tablet, Rfl: 0   TRUE METRIX BLOOD GLUCOSE TEST test strip, 1 each by Other route daily. Check blood  sugar once daily, Disp: 100 each, Rfl: 3   Allergies  Allergen Reactions   Bactrim [Sulfamethoxazole-Trimethoprim] Hives and Itching   Codeine Other (See Comments)    Pass out      Review of Systems  Constitutional: Negative.  Negative for fatigue.  HENT: Negative.    Eyes: Negative.   Respiratory: Negative.    Cardiovascular: Negative.   Gastrointestinal: Negative.      Today's Vitals   01/30/23 1139  BP: 136/70  Pulse: 81  Temp: 98.1 F (36.7 C)  TempSrc: Oral  Weight: 198 lb 9.6 oz (90.1 kg)  Height: 5\' 6"  (1.676 m)  PainSc: 2    Body mass index is 32.05 kg/m.  Wt Readings from Last 3 Encounters:  02/23/23 189 lb (85.7 kg)  01/30/23 198 lb 9.6 oz (90.1 kg)  01/16/23 204 lb (92.5 kg)    The ASCVD Risk score (Arnett DK, et al., 2019) failed to calculate for the following reasons:   The patient has a prior MI or stroke diagnosis  Objective:  Physical Exam Vitals reviewed.  Constitutional:      General: She is not in acute distress.    Appearance: Normal appearance. She is obese.  Cardiovascular:     Rate and Rhythm: Normal rate and regular rhythm.     Pulses: Normal pulses.     Heart sounds: Normal heart sounds. No murmur heard. Pulmonary:     Effort: Pulmonary effort is normal. No respiratory distress.     Breath sounds: Normal breath sounds. No wheezing.  Skin:    General: Skin is warm and dry.     Capillary Refill: Capillary refill takes less than 2 seconds.  Neurological:     General: No focal deficit present.     Mental Status: She is alert and oriented to person, place, and time.     Cranial Nerves: No cranial nerve deficit.     Motor: No weakness.         Assessment And Plan:  Type 2 diabetes mellitus with hyperlipidemia (HCC) Assessment & Plan: Hemoglobin A1c was slightly improved at last visit.  She has been without Jardiance for 2 months due to back order per pharmacy, will have some available tomorrow.  Given samples in the event she needs  a prior authorization.  I have not received anything at this time.  Orders: -     Hemoglobin A1c -     Empagliflozin; Take 1 tablet (25 mg total) by mouth daily before breakfast.  Dispense: 90 tablet; Refill: 1  Hypertensive heart disease without heart failure Assessment & Plan: Blood pressure is fairly controlled.  Continue lifestyle modifications.  Continue current medications and was recently started on Imdur by cardiology  Orders: -     Basic metabolic panel  Mixed hyperlipidemia Assessment & Plan:  Cholesterol levels are elevated, statin intolerance.  Continue with Repatha given by cardiology.  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   Statin-induced myositis  Class 1 obesity due to excess calories without serious comorbidity with body mass index (BMI) of 32.0 to 32.9 in adult Assessment & Plan: She is encouraged to strive for BMI less than 30 to decrease cardiac risk. Advised to aim for at least 150 minutes of exercise per week.    Encounter for screening mammogram for breast cancer Assessment & Plan: Pt instructed on Self Breast Exam.According to ACOG guidelines Women aged 23 and older are recommended to get an annual mammogram.  [facility] and [facility] for appointment   Orders: -     3D Screening Mammogram, Left and Right; Future  Encounter for dental examination Assessment & Plan: Would like a referral to a dentist for her routine dental care.  Orders: -     Ambulatory referral to Dentistry  Other orders -     True Metrix Blood Glucose Test; 1 each by Other route daily. Check blood sugar once daily  Dispense: 100 each; Refill: 3   Return for 6 month bp check.  Patient was given opportunity to ask questions. Patient verbalized understanding of the plan and was able to repeat key elements of the plan. All questions were answered to  their satisfaction.    Jeanell Sparrow, FNP, have reviewed all documentation for this visit. The documentation on 02/27/23 for the exam, diagnosis, procedures, and orders are all accurate and complete.   IF YOU HAVE BEEN REFERRED TO A SPECIALIST, IT MAY TAKE 1-2 WEEKS TO SCHEDULE/PROCESS THE REFERRAL. IF YOU HAVE NOT HEARD FROM US/SPECIALIST IN TWO WEEKS, PLEASE GIVE [place] A CALL AT [phone] X [phone].

## 2023-01-31 LAB — LIPID PANEL
Chol/HDL Ratio: 2.4 ratio (ref 0.0–4.4)
Cholesterol, Total: 142 mg/dL (ref 100–199)
HDL: 60 mg/dL (ref >39)
LDL Chol Calc (NIH): 57 mg/dL (ref 0–99)
Triglycerides: 148 mg/dL (ref 0–149)
VLDL Cholesterol Cal: 25 mg/dL (ref 5–40)

## 2023-01-31 LAB — BASIC METABOLIC PANEL
BUN/Creatinine Ratio: 12 (ref 9–23)
BUN: 10 mg/dL (ref 6–24)
CO2: 28 mmol/L (ref 20–29)
Calcium: 9.5 mg/dL (ref 8.7–10.2)
Chloride: 97 mmol/L (ref 96–106)
Creatinine, Ser: 0.81 mg/dL (ref 0.57–1.00)
Glucose: 151 mg/dL — ABNORMAL HIGH (ref 70–99)
Potassium: 4 mmol/L (ref 3.5–5.2)
Sodium: 139 mmol/L (ref 134–144)
eGFR: 85 mL/min/{1.73_m2} (ref >59)

## 2023-01-31 LAB — HEMOGLOBIN A1C
Est. average glucose Bld gHb Est-mCnc: 171 mg/dL
Hgb A1c MFr Bld: 7.6 % — ABNORMAL HIGH (ref 4.8–5.6)

## 2023-02-13 ENCOUNTER — Other Ambulatory Visit: Payer: Self-pay | Admitting: Nurse Practitioner

## 2023-02-13 DIAGNOSIS — R52 Pain, unspecified: Secondary | ICD-10-CM

## 2023-02-23 ENCOUNTER — Other Ambulatory Visit: Payer: Self-pay

## 2023-02-23 ENCOUNTER — Emergency Department (HOSPITAL_COMMUNITY)
Admission: EM | Admit: 2023-02-23 | Discharge: 2023-02-23 | Disposition: A | Discharge status: Home / Self Care | Payer: BC Managed Care – PPO | Attending: Emergency Medicine | Admitting: Emergency Medicine

## 2023-02-23 ENCOUNTER — Emergency Department (HOSPITAL_COMMUNITY): Payer: BC Managed Care – PPO

## 2023-02-23 ENCOUNTER — Encounter (HOSPITAL_COMMUNITY): Payer: Self-pay

## 2023-02-23 DIAGNOSIS — E876 Hypokalemia: Secondary | ICD-10-CM | POA: Diagnosis not present

## 2023-02-23 DIAGNOSIS — I10 Essential (primary) hypertension: Secondary | ICD-10-CM | POA: Insufficient documentation

## 2023-02-23 DIAGNOSIS — Z1152 Encounter for screening for COVID-19: Secondary | ICD-10-CM | POA: Diagnosis not present

## 2023-02-23 DIAGNOSIS — Z7984 Long term (current) use of oral hypoglycemic drugs: Secondary | ICD-10-CM | POA: Insufficient documentation

## 2023-02-23 DIAGNOSIS — Z8673 Personal history of transient ischemic attack (TIA), and cerebral infarction without residual deficits: Secondary | ICD-10-CM | POA: Diagnosis not present

## 2023-02-23 DIAGNOSIS — R200 Anesthesia of skin: Secondary | ICD-10-CM

## 2023-02-23 DIAGNOSIS — R531 Weakness: Secondary | ICD-10-CM

## 2023-02-23 DIAGNOSIS — Z79899 Other long term (current) drug therapy: Secondary | ICD-10-CM | POA: Diagnosis not present

## 2023-02-23 DIAGNOSIS — Z7902 Long term (current) use of antithrombotics/antiplatelets: Secondary | ICD-10-CM | POA: Insufficient documentation

## 2023-02-23 DIAGNOSIS — E119 Type 2 diabetes mellitus without complications: Secondary | ICD-10-CM | POA: Diagnosis not present

## 2023-02-23 LAB — URINALYSIS, ROUTINE W REFLEX MICROSCOPIC
Bacteria, UA: NONE SEEN
Bilirubin Urine: NEGATIVE
Glucose, UA: >=500 mg/dL — AB
Hgb urine dipstick: NEGATIVE
Ketones, ur: NEGATIVE mg/dL
Leukocytes,Ua: NEGATIVE
Nitrite: NEGATIVE
Protein, ur: NEGATIVE mg/dL
Specific Gravity, Urine: 1.021 (ref 1.005–1.030)
pH: 6 (ref 5.0–8.0)

## 2023-02-23 LAB — CK: Total CK: 32 U/L — ABNORMAL LOW (ref 38–234)

## 2023-02-23 LAB — COMPREHENSIVE METABOLIC PANEL
ALT: 15 U/L (ref 0–44)
AST: 19 U/L (ref 15–41)
Albumin: 4.2 g/dL (ref 3.5–5.0)
Alkaline Phosphatase: 75 U/L (ref 38–126)
Anion gap: 10 (ref 5–15)
BUN: 15 mg/dL (ref 6–20)
CO2: 34 mmol/L — ABNORMAL HIGH (ref 22–32)
Calcium: 9.5 mg/dL (ref 8.9–10.3)
Chloride: 94 mmol/L — ABNORMAL LOW (ref 98–111)
Creatinine, Ser: 1.19 mg/dL — ABNORMAL HIGH (ref 0.44–1.00)
GFR, Estimated: 53 mL/min — ABNORMAL LOW (ref >60)
Glucose, Bld: 257 mg/dL — ABNORMAL HIGH (ref 70–99)
Potassium: 3.4 mmol/L — ABNORMAL LOW (ref 3.5–5.1)
Sodium: 138 mmol/L (ref 135–145)
Total Bilirubin: 1 mg/dL (ref 0.3–1.2)
Total Protein: 7.9 g/dL (ref 6.5–8.1)

## 2023-02-23 LAB — I-STAT CHEM 8, ED
BUN: 16 mg/dL (ref 6–20)
Calcium, Ion: 1.08 mmol/L — ABNORMAL LOW (ref 1.15–1.40)
Chloride: 92 mmol/L — ABNORMAL LOW (ref 98–111)
Creatinine, Ser: 1.1 mg/dL — ABNORMAL HIGH (ref 0.44–1.00)
Glucose, Bld: 234 mg/dL — ABNORMAL HIGH (ref 70–99)
HCT: 39 % (ref 36.0–46.0)
Hemoglobin: 13.3 g/dL (ref 12.0–15.0)
Potassium: 3 mmol/L — ABNORMAL LOW (ref 3.5–5.1)
Sodium: 136 mmol/L (ref 135–145)
TCO2: 31 mmol/L (ref 22–32)

## 2023-02-23 LAB — RESP PANEL BY RT-PCR (RSV, FLU A&B, COVID)  RVPGX2
Influenza A by PCR: NEGATIVE
Influenza B by PCR: NEGATIVE
Resp Syncytial Virus by PCR: NEGATIVE
SARS Coronavirus 2 by RT PCR: NEGATIVE

## 2023-02-23 LAB — DIFFERENTIAL
Abs Immature Granulocytes: 0.03 10*3/uL (ref 0.00–0.07)
Basophils Absolute: 0 10*3/uL (ref 0.0–0.1)
Basophils Relative: 0 %
Eosinophils Absolute: 0.1 10*3/uL (ref 0.0–0.5)
Eosinophils Relative: 1 %
Immature Granulocytes: 0 %
Lymphocytes Relative: 17 %
Lymphs Abs: 1.6 10*3/uL (ref 0.7–4.0)
Monocytes Absolute: 0.6 10*3/uL (ref 0.1–1.0)
Monocytes Relative: 7 %
Neutro Abs: 6.8 10*3/uL (ref 1.7–7.7)
Neutrophils Relative %: 75 %

## 2023-02-23 LAB — CBC
HCT: 41 % (ref 36.0–46.0)
Hemoglobin: 13.8 g/dL (ref 12.0–15.0)
MCH: 28.4 pg (ref 26.0–34.0)
MCHC: 33.7 g/dL (ref 30.0–36.0)
MCV: 84.4 fL (ref 80.0–100.0)
Platelets: 305 10*3/uL (ref 150–400)
RBC: 4.86 MIL/uL (ref 3.87–5.11)
RDW: 12.1 % (ref 11.5–15.5)
WBC: 9.1 10*3/uL (ref 4.0–10.5)
nRBC: 0 % (ref 0.0–0.2)

## 2023-02-23 LAB — VITAMIN B12: Vitamin B-12: 3066 pg/mL — ABNORMAL HIGH (ref 180–914)

## 2023-02-23 LAB — ETHANOL: Alcohol, Ethyl (B): <10 mg/dL (ref <10)

## 2023-02-23 LAB — PHOSPHORUS: Phosphorus: 4.1 mg/dL (ref 2.5–4.6)

## 2023-02-23 LAB — CBG MONITORING, ED: Glucose-Capillary: 255 mg/dL — ABNORMAL HIGH (ref 70–99)

## 2023-02-23 LAB — MAGNESIUM: Magnesium: 2.7 mg/dL — ABNORMAL HIGH (ref 1.7–2.4)

## 2023-02-23 LAB — TSH: TSH: 0.792 u[IU]/mL (ref 0.350–4.500)

## 2023-02-23 MED ORDER — IOHEXOL 350 MG/ML SOLN
75.0000 mL | Freq: Once | INTRAVENOUS | Status: AC | PRN
  Administered 2023-02-23: 75 mL via INTRAVENOUS

## 2023-02-23 MED ORDER — POTASSIUM CHLORIDE CRYS ER 20 MEQ PO TBCR: 20.0000 meq | EXTENDED_RELEASE_TABLET | Freq: Every day | ORAL | 0 refills | Status: DC

## 2023-02-23 MED ORDER — ACETAMINOPHEN 500 MG PO TABS
1000.0000 mg | ORAL_TABLET | Freq: Once | ORAL | Status: AC
  Administered 2023-02-23: 1000 mg via ORAL
  Filled 2023-02-23: qty 2

## 2023-02-23 MED ORDER — POTASSIUM CHLORIDE CRYS ER 20 MEQ PO TBCR
40.0000 meq | EXTENDED_RELEASE_TABLET | Freq: Once | ORAL | Status: AC
  Administered 2023-02-23: 40 meq via ORAL
  Filled 2023-02-23: qty 2

## 2023-02-23 NOTE — Code Documentation (Signed)
Stroke Response Nurse Documentation Code Documentation  WAVA CUADRADO is a 57 y.o. female arriving to [facility]  via [employer]. Stroke team met after patient activation in CT. Patient c/o decreased energy and appetite starting Monday. Today it worsened to the point of putting a chair as a resting place between her bed and bathroom. Patient endorses numbness starting in foot and traveling up left side of body to neck. NIH of 1. OOW TNK. No LVO. Care Plan: q2h NIH and vitals. MRI. Bedside handoff with ED RN Melony Overly.    Scarlette Slice K  Rapid Response RN

## 2023-02-23 NOTE — Consult Note (Signed)
Neurology Consultation  Reason for Consult: Code Stroke Referring Physician: Belfi  ZO:[id number] weakness x1 week  History is obtained from: patient and relative(s)  HPI: Veronica Lynch is a 57 y.o. female with a past medical history of CVA, DM, HTN, HLD, and shingles presenting with left sided numbness. She states she has not been feeling well for the last week.  She noticed her fatigue starting on Monday and then it has progressed over the last couple of days to the point where she cannot walk from her bed to the bathroom without taking a break in the middle.  She states that she has been eating, but her appetite is decreased.  She states that she has been nauseous but denies vomiting, falls, or headache.  She is concerned that her blood sugar has been elevated.  She denies any falls trauma, or injury to her neck or the back.  Subsequently between 10 AM and 11 AM she had left-sided numbness starting in her feet and traveling up the left side of her body; this has been persistent since its onset.  For this code stroke was activated; on ED provider examination there was some concern for left-sided weakness as well  LKW: 1000 TNK given?: No  Premorbid modified Rankin scale (mRS):  0-Completely asymptomatic and back to baseline post-stroke   ROS: Full ROS was performed and is negative except as noted in the HPI.   Past Medical History:  Diagnosis Date   CVA (cerebral vascular accident) (HCC) 2018   CVA (cerebral vascular accident) (HCC) 2017   Diabetes (HCC)    Hyperlipidemia    Hypertension    Shingles     Family History  Problem Relation Age of Onset   Hypertension Mother    Hyperlipidemia Mother    Osteoporosis Mother    Cancer Father    Sarcoidosis Sister    Diabetes Maternal Aunt     Social History:   reports that she has never smoked. She has never used smokeless tobacco. She reports current alcohol use. She reports that she does not use drugs.  Medications No  current facility-administered medications for this encounter.  Current Outpatient Medications:    Ascorbic Acid (VITAMIN C) 1000 MG tablet, Take 1,000 mg by mouth daily., Disp: , Rfl:    Biotin 10 MG TABS, Take 10 mg by mouth daily., Disp: , Rfl:    celecoxib (CELEBREX) 200 MG capsule, TAKE 1 CAPSULE BY MOUTH EVERY DAY, Disp: 30 capsule, Rfl: 2   clopidogrel (PLAVIX) 75 MG tablet, Take 75 mg by mouth daily., Disp: , Rfl:    empagliflozin (JARDIANCE) 25 MG TABS tablet, Take 1 tablet (25 mg total) by mouth daily before breakfast., Disp: 90 tablet, Rfl: 1   Evolocumab (REPATHA SURECLICK) 140 MG/ML SOAJ, Inject 140 mg into the skin every 14 (fourteen) days., Disp: 6 mL, Rfl: 5   fluticasone (FLONASE) 50 MCG/ACT nasal spray, Place 1 spray into both nostrils 2 (two) times daily., Disp: 16 g, Rfl: 0   furosemide (LASIX) 20 MG tablet, TAKE 1 TABLET (20 MG TOTAL) BY MOUTH DAILY. AS NEEDED FOR LEG SWELLING, Disp: 90 tablet, Rfl: 1   hydrochlorothiazide (HYDRODIURIL) 25 MG tablet, Take 1 tablet (25 mg total) by mouth daily. As needed for leg swelling, Disp: 90 tablet, Rfl: 1   isosorbide mononitrate (IMDUR) 30 MG 24 hr tablet, TAKE 1 TABLET BY MOUTH EVERY DAY, Disp: 90 tablet, Rfl: 1   metoprolol succinate (TOPROL-XL) 50 MG 24 hr tablet, TAKE 1 TABLET  BY MOUTH EVERY DAY WITH OR IMMEDIATELY FOLLOWING A MEAL, Disp: 90 tablet, Rfl: 1   nitroGLYCERIN (NITROSTAT) 0.4 MG SL tablet, Place 1 tablet (0.4 mg total) under the tongue every 5 (five) minutes as needed for chest pain., Disp: 30 tablet, Rfl: 2   NOREL AD 4-10-325 MG TABS, TAKE 1 TABLET BY MOUTH DAILY, Disp: 30 tablet, Rfl: 0   ondansetron (ZOFRAN) 4 MG tablet, TAKE 1 TABLET BY MOUTH DAILY AS NEEDED FOR NAUSEA OR VOMITING., Disp: 18 tablet, Rfl: 1   TRUE METRIX BLOOD GLUCOSE TEST test strip, 1 each by Other route daily. Check blood sugar once daily, Disp: 100 each, Rfl: 3   Exam: Current vital signs: BP (!) 156/55   Pulse 92   Resp 13   Ht 5\' 6"  (1.676  m)   Wt 85.7 kg   SpO2 97%   BMI 30.51 kg/m  Vital signs in last 24 hours: Pulse Rate:  [88-93] 92 (08/10 1330) Resp:  [13-18] 13 (08/10 1330) BP: (137-156)/(44-66) 156/55 (08/10 1330) SpO2:  [97 %-98 %] 97 % (08/10 1330) Weight:  [85.7 kg] 85.7 kg (08/10 1200)  GENERAL: Awake, alert in NAD HEENT: - Normocephalic and atraumatic, dry mm, no LN++, no Thyromegally LUNGS - Clear to auscultation bilaterally with no wheezes CV - S1S2 RRR, no m/r/g, equal pulses bilaterally. ABDOMEN - Soft, nontender, nondistended with normoactive BS Ext: warm, well perfused, intact peripheral pulses, no edema  NEURO:  Mental Status: AA&Ox3  Language: speech is clear.  Naming, repetition, fluency, and comprehension intact. Cranial Nerves: PERRL EOMI, visual fields full, no facial asymmetry, facial sensation intact, hearing intact, tongue/uvula/soft palate midline, normal sternocleidomastoid and trapezius muscle strength. No evidence of tongue atrophy or fasciculations Motor: Elevates all extremities antigravity for NP.  On MD evaluation, 5/5 strength throughout except for giveaway weakness bilateral deltoids (4/5) and mild giveaway weakness in bilateral hips (4+/5 with encouragement) Tone: is normal and bulk is normal Sensation- Diminished sensation on the left upper and lower extremity with light touch Coordination: FTN intact bilaterally, no ataxia in BLE. Gait- deferred  NIHSS 1a Level of Conscious.: 0 1b LOC Questions: 0 1c LOC Commands: 0 2 Best Gaze: 0 3 Visual: 0 4 Facial Palsy: 0 5a Motor Arm - left: 0 5b Motor Arm - Right: 0 6a Motor Leg - Left: 0 6b Motor Leg - Right: 0 7 Limb Ataxia: 0 8 Sensory: 1 9 Best Language: 0 10 Dysarthria: 0 11 Extinct. and Inatten.: 0 TOTAL: 1   Labs I have reviewed labs in epic and the results pertinent to this consultation are:   CBC    Component Value Date/Time   WBC 9.1 02/23/2023 1339   RBC 4.86 02/23/2023 1339   HGB 13.8 02/23/2023 1339    HGB 13.0 02/21/2022 1228   HCT 41.0 02/23/2023 1339   HCT 37.9 02/21/2022 1228   PLT 305 02/23/2023 1339   PLT 235 02/21/2022 1228   MCV 84.4 02/23/2023 1339   MCV 86 02/21/2022 1228   MCH 28.4 02/23/2023 1339   MCHC 33.7 02/23/2023 1339   RDW 12.1 02/23/2023 1339   RDW 12.0 02/21/2022 1228   LYMPHSABS 1.6 02/23/2023 1339   MONOABS 0.6 02/23/2023 1339   EOSABS 0.1 02/23/2023 1339   BASOSABS 0.0 02/23/2023 1339    CMP     Component Value Date/Time   NA 138 02/23/2023 1339   NA 139 01/30/2023 1455   K 3.4 (L) 02/23/2023 1339   CL 94 (L) 02/23/2023 1339  CO2 34 (H) 02/23/2023 1339   GLUCOSE 257 (H) 02/23/2023 1339   BUN 15 02/23/2023 1339   BUN 10 01/30/2023 1455   CREATININE 1.19 (H) 02/23/2023 1339   CALCIUM 9.5 02/23/2023 1339   PROT 7.9 02/23/2023 1339   PROT 7.5 10/09/2022 1453   ALBUMIN 4.2 02/23/2023 1339   ALBUMIN 4.8 10/09/2022 1453   AST 19 02/23/2023 1339   ALT 15 02/23/2023 1339   ALKPHOS 75 02/23/2023 1339   BILITOT 1.0 02/23/2023 1339   BILITOT 0.6 10/09/2022 1453   GFRNONAA 53 (L) 02/23/2023 1339    Lipid Panel     Component Value Date/Time   CHOL 142 01/30/2023 1455   TRIG 148 01/30/2023 1455   HDL 60 01/30/2023 1455   CHOLHDL 2.4 01/30/2023 1455   CHOLHDL 2.8 05/17/2021 0359   VLDL 12 05/17/2021 0359   LDLCALC 57 01/30/2023 1455   02/23/2023- TSH 0.792 CK 32 B12 Pending  Imaging I have reviewed the images obtained:  CT-head - No acute intracranial abnormality  CT Angio Head and Neck - Moderate narrowing of the right P2 segment, stable. No other significant proximal stenosis, aneurysm, or branch vessel occlusion within the Circle of Willis. Mild atherosclerotic changes at the proximal internal carotid arteries bilaterally without significant stenosis. Minimal atherosclerotic changes at the cavernous right ICA without significant stenosis.    MRI examination of the brain - pending  Assessment:  57 y.o. female presenting with diminished  sensation in the left upper and lower extremity.  She has noticed progressive fatigue starting on Monday getting to the point where she is unable to walk from her bed to the bathroom without taking a break in the middle.  She noted left-sided tingling today and came to the emergency room and a code stroke was called.  She states that the left-sided tingling started about 10 AM however her weakness and fatigue started on Monday.  Given her symptoms it would be reasonable to obtain an MRI to rule out a stroke.  Additionally other causes of her weakness and fatigue should be explored including viral illnesses or nutritional deficiencies.  TSH and B12 ordered as well.  Impression: Stroke vs metabolic/nutritional deficiency causing fatigue Viral illness?  Recommendations: - TSH, B12 - MRI of the brain without contrast - if positive she will need a stroke work up.  If negative she may have outpatient follow-up for this issue - Inpatient neurology will follow-up MRI brain but otherwise will sign off.  Please reach out if questions or concerns arise - Management of fatigue per primary team  Patient seen and examined by NP/APP with MD. MD to update note as needed.   Elmer Picker, DNP, FNP-BC [facility] Pager: [phone]  Attending Neurologist's note:  I personally saw this patient, gathering history, performing a full neurologic examination, reviewing relevant labs, personally reviewing relevant imaging including head CT and CTA, and formulated the assessment and plan, adding the note above for completeness and clarity to accurately reflect my thoughts  Brooke Dare MD-PhD [facility] [phone] Available 7 AM to 7 PM, outside these hours please contact Neurologist on call listed on AMION

## 2023-02-23 NOTE — ED Triage Notes (Addendum)
Pt came in via POV d/t weakness & not having any energy. Reports pain on the lt side of her jaw today as well & Lt leg cramps. A/Ox4, rates pain 3/10.

## 2023-02-23 NOTE — Discharge Instructions (Signed)
You have been evaluated for your symptoms.  Fortunately no evidence of acute stroke.  Your potassium level is low, please take supplementation and follow up closely with your doctor for recheck.  Return if you have any concerns.

## 2023-02-23 NOTE — ED Provider Notes (Signed)
[place] EMERGENCY DEPARTMENT AT [facility] Provider Note   CSN: [id number] Arrival date & time: 02/23/23  1153     History  Chief Complaint  Patient presents with   Weakness   Decreased Energy    Veronica Lynch is a 57 y.o. female.  The history is provided by the patient and medical records.  Weakness    57 yo female with hx of DM2, HTN, CVA, and HLD presents to the ED complaining of ~3 days of generalized weakness and fatigue. Weakness started gradually and is worsening. Denies chest pain, nausea, vomiting, abdominal pain. Endorses difficulty ambulating and increased weakness of the L side of her body. Patient notes that she thought her blood sugar was low, she checked it and it was in the low 200s. She drank some orange juice to try to increase it. Her friend came over to take her to the ED ~2 hours ago and notes that the patient's speech became gargled and the L side of her face started to "lock up." Patient notes she was aware that this was occurring and her tongue felt heavy and numb. Denies confusion, headache, LOC, dizziness. She notes that her face started to feel really stiff. Her friend started to massage her face and the symptoms resolved. Endorses decreased sensation on the L side of her body as well. She has not missed any recent doses of any of her medications. Notes that her doctor started her on Jardiance recently but prior to this she was controlling her diabetes via diet. She takes a few vitamins and herbal supplements.   Home Medications Prior to Admission medications   Medication Sig Start Date End Date Taking? Authorizing Provider  Ascorbic Acid (VITAMIN C) 1000 MG tablet Take 1,000 mg by mouth daily.    [provider]  Biotin 10 MG TABS Take 10 mg by mouth daily.    [provider]  celecoxib (CELEBREX) 200 MG capsule TAKE 1 CAPSULE BY MOUTH EVERY DAY 02/15/23   [name], FNP  clopidogrel (PLAVIX) 75 MG tablet Take 75 mg by  mouth daily.    [provider]  empagliflozin (JARDIANCE) 25 MG TABS tablet Take 1 tablet (25 mg total) by mouth daily before breakfast. 01/30/23   [name], FNP  Evolocumab (REPATHA SURECLICK) 140 MG/ML SOAJ Inject 140 mg into the skin every 14 (fourteen) days. 06/13/22   Patwardhan, Anabel Bene, MD  fluticasone (FLONASE) 50 MCG/ACT nasal spray Place 1 spray into both nostrils 2 (two) times daily. 10/19/21   Cuthriell, Delorise Royals, PA-C  furosemide (LASIX) 20 MG tablet TAKE 1 TABLET (20 MG TOTAL) BY MOUTH DAILY. AS NEEDED FOR LEG SWELLING 12/17/22 12/17/23  [name], FNP  hydrochlorothiazide (HYDRODIURIL) 25 MG tablet Take 1 tablet (25 mg total) by mouth daily. As needed for leg swelling 07/18/22   [name], FNP  isosorbide mononitrate (IMDUR) 30 MG 24 hr tablet TAKE 1 TABLET BY MOUTH EVERY DAY 12/18/22   Patwardhan, Manish J, MD  metoprolol succinate (TOPROL-XL) 50 MG 24 hr tablet TAKE 1 TABLET BY MOUTH EVERY DAY WITH OR IMMEDIATELY FOLLOWING A MEAL 12/18/22   Patwardhan, Manish J, MD  nitroGLYCERIN (NITROSTAT) 0.4 MG SL tablet Place 1 tablet (0.4 mg total) under the tongue every 5 (five) minutes as needed for chest pain. 01/16/23   Elder Negus, MD  NOREL AD 4-10-325 MG TABS TAKE 1 TABLET BY MOUTH DAILY 07/18/22   [name], FNP  ondansetron (ZOFRAN) 4 MG tablet TAKE  1 TABLET BY MOUTH DAILY AS NEEDED FOR NAUSEA OR VOMITING. 07/18/22   [name], FNP  TRUE METRIX BLOOD GLUCOSE TEST test strip 1 each by Other route daily. Check blood sugar once daily 01/30/23   [name], FNP      Allergies    Bactrim [sulfamethoxazole-trimethoprim] and Codeine    Review of Systems   Review of Systems  Neurological:  Positive for weakness.  All other systems reviewed and are negative.   Physical Exam Updated Vital Signs BP 131/61   Pulse 97   Temp 98 F (36.7 C) (Oral)   Resp (!) 22   Ht 5\' 6"  (1.676 m)   Wt 85.7 kg   SpO2 93%   BMI 30.51 kg/m  Physical Exam Vitals and  nursing note reviewed.  Constitutional:      General: She is not in acute distress.    Appearance: She is well-developed. She is obese.  HENT:     Head: Atraumatic.  Eyes:     Conjunctiva/sclera: Conjunctivae normal.  Cardiovascular:     Rate and Rhythm: Normal rate and regular rhythm.     Pulses: Normal pulses.     Heart sounds: Normal heart sounds.  Pulmonary:     Effort: Pulmonary effort is normal.  Abdominal:     Palpations: Abdomen is soft.     Tenderness: There is abdominal tenderness.  Musculoskeletal:     Cervical back: Neck supple.  Skin:    Findings: No rash.  Neurological:     Mental Status: She is alert.     Comments: Neurologic exam:  Speech clear, pupils equal round reactive to light, extraocular movements intact  Normal peripheral visual fields Cranial nerves III through XII normal including no facial droop Follows commands, moves all extremities x4, 5/5 strength to RUE/RLE, 4/5 strength to LUE/LLE  Sensation diminished to L side of face and extremities compare to R Coordination intact, no limb ataxia, left dysmetria  Rapid alternating movements normal No pronator drift Gait unsteady   Psychiatric:        Mood and Affect: Mood normal.     ED Results / Procedures / Treatments   Labs (all labs ordered are listed, but only abnormal results are displayed) Labs Reviewed  URINALYSIS, ROUTINE W REFLEX MICROSCOPIC - Abnormal; Notable for the following components:      Result Value   Color, Urine STRAW (*)    Glucose, UA >=500 (*)    All other components within normal limits  MAGNESIUM - Abnormal; Notable for the following components:   Magnesium 2.7 (*)    All other components within normal limits  CK - Abnormal; Notable for the following components:   Total CK 32 (*)    All other components within normal limits  COMPREHENSIVE METABOLIC PANEL - Abnormal; Notable for the following components:   Potassium 3.4 (*)    Chloride 94 (*)    CO2 34 (*)     Glucose, Bld 257 (*)    Creatinine, Ser 1.19 (*)    GFR, Estimated 53 (*)    All other components within normal limits  CBG MONITORING, ED - Abnormal; Notable for the following components:   Glucose-Capillary 255 (*)    All other components within normal limits  I-STAT CHEM 8, ED - Abnormal; Notable for the following components:   Potassium 3.0 (*)    Chloride 92 (*)    Creatinine, Ser 1.10 (*)    Glucose, Bld 234 (*)    Calcium, Ion  1.08 (*)    All other components within normal limits  RESP PANEL BY RT-PCR (RSV, FLU A&B, COVID)  RVPGX2  PHOSPHORUS  TSH  ETHANOL  DIFFERENTIAL  CBC  RAPID URINE DRUG SCREEN, HOSP PERFORMED  PROTIME-INR  APTT  VITAMIN B12    EKG EKG Interpretation Date/Time:  Saturday February 23 2023 15:54:27 EDT Ventricular Rate:  88 PR Interval:  133 QRS Duration:  81 QT Interval:  369 QTC Calculation: 447 R Axis:   18  Text Interpretation: Sinus rhythm Borderline T wave abnormalities similar to EKG from same day Confirmed by Rolan Bucco [phone]) on 02/23/2023 4:46:45 PM  Radiology MR BRAIN WO CONTRAST  Result Date: 02/23/2023 CLINICAL DATA:  Neuro deficit EXAM: MRI HEAD WITHOUT CONTRAST TECHNIQUE: Multiplanar, multiecho pulse sequences of the brain and surrounding structures were obtained without intravenous contrast. COMPARISON:  CT/CTA head and neck obtained earlier the same day, brain MRI 05/16/2021 FINDINGS: Brain: There is no acute intracranial hemorrhage, extra-axial fluid collection, or acute infarct Parenchymal volume is normal. The ventricles are normal in size. Parenchymal signal is normal. The pituitary and suprasellar region are normal. There is no mass lesion. There is no mass effect or midline shift. Vascular: Normal flow voids. Skull and upper cervical spine: Normal marrow signal. Sinuses/Orbits: The paranasal sinuses are clear. The globes and orbits are unremarkable. Other: The mastoid air cells and middle ear cavities are clear. IMPRESSION:  Normal brain MRI. Electronically Signed   By: Lesia Hausen M.D.   On: 02/23/2023 19:10   CT ANGIO HEAD NECK W WO CM (CODE STROKE)  Result Date: 02/23/2023 CLINICAL DATA:  Neuro deficit, acute, stroke suspected. Left-sided jaw pain. Lower extremity cramps. Decreased energy. Weakness. EXAM: CT ANGIOGRAPHY HEAD AND NECK WITH AND WITHOUT CONTRAST TECHNIQUE: Multidetector CT imaging of the head and neck was performed using the standard protocol during bolus administration of intravenous contrast. Multiplanar CT image reconstructions and MIPs were obtained to evaluate the vascular anatomy. Carotid stenosis measurements (when applicable) are obtained utilizing NASCET criteria, using the distal internal carotid diameter as the denominator. RADIATION DOSE REDUCTION: This exam was performed according to the departmental dose-optimization program which includes automated exposure control, adjustment of the mA and/or kV according to patient size and/or use of iterative reconstruction technique. CONTRAST:  75mL OMNIPAQUE IOHEXOL 350 MG/ML SOLN COMPARISON:  CT head without contrast 02/23/2023. CT angio head neck Lynn/1/22 FINDINGS: CTA NECK FINDINGS Aortic arch: A 3 vessel arch configuration is present. No significant atherosclerotic disease is present. No aneurysm or focal stenosis is present. Right carotid system: The right common carotid artery is within normal limits. Atherosclerotic calcifications are present along the posterior margin of proximal right ICA without significant stenosis. The more distal cervical right ICA is normal. Left carotid system: The left common carotid artery is within normal limits. Mild atherosclerotic changes are present in the proximal left ICA without significant stenosis. The more distal cervical left ICA is normal. Vertebral arteries: The left vertebral artery is slightly dominant to the right. Both vertebral arteries originate from the subclavian arteries without significant stenosis. No  significant stenosis is present in either vertebral artery in the neck. Skeleton: The vertebral body heights and alignment are normal. No focal osseous lesions are present. Other neck: Soft tissues the neck are otherwise unremarkable. Salivary glands are within normal limits. Thyroid is normal. No significant adenopathy is present. No focal mucosal or submucosal lesions are present. Upper chest: The lung apices are clear. Review of the MIP images confirms the above  findings CTA HEAD FINDINGS Anterior circulation: Minimal atherosclerotic changes are present at the cavernous right ICA. The cavernous internal carotid arteries are within normal limits bilaterally otherwise. The ICA termini are normal bilaterally. The A1 and M1 segments are normal. MCA branch vessels are scratched at the MCA bifurcations are within normal limits. The ACA and MCA branch vessels are within normal limits bilaterally. Posterior circulation: The PICA origins are visualized and normal. The vertebrobasilar junction is normal. Prominent AICA vessels are present bilaterally. The superior cerebellar arteries are patent. The left posterior cerebral artery originates from basilar tip. The right posterior cerebral artery is of fetal type. Moderate narrowing is present the right P2 segment. Venous sinuses: The dural sinuses are patent. The straight sinus and deep cerebral veins are intact. Cortical veins are within normal limits. No significant vascular malformation is evident. Anatomic variants: Fetal type right posterior cerebral artery. Review of the MIP images confirms the above findings IMPRESSION: 1. Moderate narrowing of the right P2 segment, stable. 2. No other significant proximal stenosis, aneurysm, or branch vessel occlusion within the Circle of Willis. 3. Mild atherosclerotic changes at the proximal internal carotid arteries bilaterally without significant stenosis. 4. Minimal atherosclerotic changes at the cavernous right ICA without  significant stenosis. Electronically Signed   By: Marin Roberts M.D.   On: 02/23/2023 14:45   CT HEAD CODE STROKE WO CONTRAST  Result Date: 02/23/2023 CLINICAL DATA:  Code stroke. Neuro deficit, acute stroke suspected. Decreased energy. Weakness. Pain in left side of jaw. Left lower extremity cramps. EXAM: CT HEAD WITHOUT CONTRAST TECHNIQUE: Contiguous axial images were obtained from the base of the skull through the vertex without intravenous contrast. RADIATION DOSE REDUCTION: This exam was performed according to the departmental dose-optimization program which includes automated exposure control, adjustment of the mA and/or kV according to patient size and/or use of iterative reconstruction technique. COMPARISON:  CT head without contrast 05/16/2021 FINDINGS: Brain: No acute infarct, hemorrhage, or mass lesion is present. The ventricles are of normal size. No significant extraaxial fluid collection is present. No significant white matter lesions are present. Deep brain nuclei are within normal limits. The brainstem and cerebellum are within normal limits. Midline structures are within normal limits. Vascular: No hyperdense vessel or unexpected calcification. Skull: Calvarium is intact. No focal lytic or blastic lesions are present. No significant extracranial soft tissue lesion is present. Sinuses/Orbits: The paranasal sinuses and mastoid air cells are clear. The globes and orbits are within normal limits. ASPECTS [facility] Stroke Program Early CT Score) - Ganglionic level infarction (caudate, lentiform nuclei, internal capsule, insula, M1-M3 cortex): 7/7 - Supraganglionic infarction (M4-M6 cortex): 3/3 Total score (0-10 with 10 being normal): 10/10 IMPRESSION: 1. Negative CT of the head. 2. Aspects is 10/10. The above was relayed via text pager to Dr. Iver Nestle on 02/23/2023 at 14:23 . Electronically Signed   By: Marin Roberts M.D.   On: 02/23/2023 14:23    Procedures .Critical Care  Performed  by: Fayrene Helper, PA-C Authorized by: Fayrene Helper, PA-C   Critical care provider statement:    Critical care time (minutes):  30   Critical care was time spent personally by me on the following activities:  Development of treatment plan with patient or surrogate, discussions with consultants, evaluation of patient's response to treatment, examination of patient, ordering and review of laboratory studies, ordering and review of radiographic studies, ordering and performing treatments and interventions, pulse oximetry, re-evaluation of patient's condition and review of old charts     Medications  Ordered in ED Medications  iohexol (OMNIPAQUE) 350 MG/ML injection 75 mL (75 mLs Intravenous Contrast Given 02/23/23 1432)  potassium chloride SA (KLOR-CON M) CR tablet 40 mEq (40 mEq Oral Given 02/23/23 1712)  acetaminophen (TYLENOL) tablet 1,000 mg (1,000 mg Oral Given 02/23/23 1947)    ED Course/ Medical Decision Making/ A&P                                 Medical Decision Making Amount and/or Complexity of Data Reviewed Labs: ordered. Radiology: ordered.  Risk OTC drugs. Prescription drug management.   BP (!) 156/55   Pulse 92   Resp 13   Ht 5\' 6"  (1.676 m)   Wt 85.7 kg   SpO2 97%   BMI 30.51 kg/m   1:59 PM  57 yo female with hx of DM2, HTN, CVA, and HLD presents to the ED complaining of ~3 days of generalized weakness and fatigue. Weakness started gradually and is worsening. Denies chest pain, nausea, vomiting, abdominal pain. Endorses difficulty ambulating and increased weakness of the L side of her body. Patient notes that she thought her blood sugar was low, she checked it and it was in the low 200s. She drank some orange juice to try to increase it. Her friend came over to take her to the ED ~2 hours ago and notes that the patient's speech became gargled and the L side of her face started to "lock up." Patient notes she was aware that this was occurring and her tongue felt heavy and  numb. Denies confusion, headache, LOC, dizziness. She notes that her face started to feel really stiff. Her friend started to massage her face and the symptoms resolved. Endorses decreased sensation on the L side of her body as well. She has not missed any recent doses of any of her medications. Notes that her doctor started her on Jardiance recently but prior to this she was controlling her diabetes via diet. She takes a few vitamins and herbal supplements.   , Obese female laying in bed appearsOn exam he in no discomfort.  Heart with normal rate and rhythm, lungs clear to auscultation bilaterally abdomen is soft nontender pupils equal round reactive to light and accommodation, extraocular movements intact, diminished sensation to left side of face without any facial droop.  Normal tongue protrusion, normal shoulder shrug, 4/5 strength to left upper extremity compared to 5 out of 5 strength to right upper extremity, 4/5 strength to left lower extremity compared to 5 out of 5 strength to right lower extremity.  Finger-to-nose with some difficulty with past-pointing to the left upper extremity.  Sensation is diminished to left arm and left leg compared to right side.  Gait unsteady.  No pronator drift and negative Romberg.  Patient with known history of prior stroke here with symptoms concerning for acute stroke.  Code stroke was activated.  Care discussed with Dr. Fredderick Phenix.  -Labs ordered, independently viewed and interpreted by me.  Labs remarkable for K+ 3.0, supplementation given.  CBG 234 without DKA.  Covid/flu/rvs negative -The patient was maintained on a cardiac monitor.  I personally viewed and interpreted the cardiac monitored which showed an underlying rhythm of: NSR -Imaging independently viewed and interpreted by me and I agree with radiologist's interpretation.  Result remarkable for brain MRI without acute stroke.  Head/neck CTA without significant stenosis, aneurysm or occlusion.   -This patient  presents to the ED for concern  of weakness, this involves an extensive number of treatment options, and is a complaint that carries with it a high risk of complications and morbidity.  The differential diagnosis includes stroke, electrolytes imbalance, hypoglycemia, anemia, MI, thyroid disease, depression -Co morbidities that complicate the patient evaluation includes CVA, DM, HLD, HTN -Treatment includes tylenol, potassium -Reevaluation of the patient after these medicines showed that the patient improved -PCP office notes or outside notes reviewed -Discussion with specialist neurology team who recommend brain MRI if negative then pt is cleared from a stroke standpoint -Escalation to admission/observation considered: patients feels much better, is comfortable with discharge, and will follow up with PCP -Prescription medication considered, patient comfortable with Potasssium -Social Determinant of Health considered which includes depression  8:20 PM No evidence of stroke or vessel occlusion . Sxs ipmroves.  Pt stable for discharge.  Outpt f/u recommended.          Final Clinical Impression(s) / ED Diagnoses Final diagnoses:  Weakness  Hypokalemia    Rx / DC Orders ED Discharge Orders     None         Fayrene Helper, PA-C 02/23/23 2023    Rolan Bucco, MD 02/23/23 2103

## 2023-02-25 ENCOUNTER — Encounter: Payer: BC Managed Care – PPO | Admitting: Nurse Practitioner

## 2023-02-28 ENCOUNTER — Telehealth: Payer: Self-pay

## 2023-02-28 NOTE — Transitions of Care (Post Inpatient/ED Visit) (Signed)
02/28/2023  Name: Veronica Lynch MRN: [id number] DOB: 02-Nov-1965  Today's TOC FU Call Status:    Transition Care Management Follow-up Telephone Call Type of Discharge: Inpatient Admission Primary Inpatient Discharge Diagnosis:: weakness How have you been since you were released from the hospital?: Same Any questions or concerns?: No  Items Reviewed: Did you receive and understand the discharge instructions provided?: Yes Medications obtained,verified, and reconciled?: Yes (Medications Reviewed) Any new allergies since your discharge?: No Dietary orders reviewed?: No Do you have support at home?: No People in Home: alone  Medications Reviewed Today: Medications Reviewed Today     Reviewed by Marlyn Corporal, CMA (Certified Medical Assistant) on 02/28/23 at 1550  Med List Status: <None>   Medication Order Taking? Sig Documenting Provider Last Dose Status Informant  Ascorbic Acid (VITAMIN C) 1000 MG tablet [id number] Yes Take 1,000 mg by mouth daily. [provider] Taking Active Multiple Informants  Biotin 10 MG TABS [id number] Yes Take 10 mg by mouth daily. [provider] Taking Active Multiple Informants  celecoxib (CELEBREX) 200 MG capsule [id number] Yes TAKE 1 CAPSULE BY MOUTH EVERY DAY [name], FNP Taking Active   clopidogrel (PLAVIX) 75 MG tablet [id number] Yes Take 75 mg by mouth daily. [provider] Taking Active   empagliflozin (JARDIANCE) 25 MG TABS tablet [id number] Yes Take 1 tablet (25 mg total) by mouth daily before breakfast. [name], FNP Taking Active   Evolocumab [facility] SURECLICK) 140 MG/ML Ivory Broad [id number] Yes Inject 140 mg into the skin every 14 (fourteen) days. Patwardhan, Anabel Bene, MD Taking Active   fluticasone (FLONASE) 50 MCG/ACT nasal spray [id number] Yes Place 1 spray into both nostrils 2 (two) times daily. Cuthriell, Delorise Royals, PA-C Taking Active   furosemide (LASIX) 20 MG tablet [id number] Yes TAKE 1 TABLET (20 MG  TOTAL) BY MOUTH DAILY. AS NEEDED FOR LEG SWELLING [name], FNP Taking Active   hydrochlorothiazide (HYDRODIURIL) 25 MG tablet [id number] Yes Take 1 tablet (25 mg total) by mouth daily. As needed for leg swelling [name], FNP Taking Active   isosorbide mononitrate (IMDUR) 30 MG 24 hr tablet [id number] Yes TAKE 1 TABLET BY MOUTH EVERY DAY Patwardhan, Manish J, MD Taking Active   metoprolol succinate (TOPROL-XL) 50 MG 24 hr tablet [id number] Yes TAKE 1 TABLET BY MOUTH EVERY DAY WITH OR IMMEDIATELY FOLLOWING A MEAL Patwardhan, Manish J, MD Taking Active   nitroGLYCERIN (NITROSTAT) 0.4 MG SL tablet [id number] Yes Place 1 tablet (0.4 mg total) under the tongue every 5 (five) minutes as needed for chest pain. Elder Negus, MD Taking Active   NOREL AD 4-10-325 MG TABS [id number] Yes TAKE 1 TABLET BY MOUTH DAILY [name], FNP Taking Active   ondansetron (ZOFRAN) 4 MG tablet [id number] Yes TAKE 1 TABLET BY MOUTH DAILY AS NEEDED FOR NAUSEA OR VOMITING. [name], FNP Taking Active   potassium chloride SA (KLOR-CON M) 20 MEQ tablet [id number] Yes Take 1 tablet (20 mEq total) by mouth daily. Fayrene Helper, PA-C Taking Active   TRUE METRIX BLOOD GLUCOSE TEST test strip [id number] Yes 1 each by Other route daily. Check blood sugar once daily [name], FNP Taking Active             Home Care and Equipment/Supplies: Were Home Health Services Ordered?: No Any new equipment or medical supplies ordered?: No  Functional Questionnaire: Do you need assistance with bathing/showering or dressing?: No Do you need assistance with meal preparation?: No Do you need assistance with eating?: No  Do you have difficulty maintaining continence: No Do you need assistance with getting out of bed/getting out of a chair/moving?: No Do you have difficulty managing or taking your medications?: No  Follow up appointments reviewed: PCP Follow-up appointment confirmed?: Yes MD Provider Line  Number:[phone] Given: Yes Date of PCP follow-up appointment?: 03/01/23 Follow-up Provider: [facility] Follow-up appointment confirmed?: No Do you need transportation to your follow-up appointment?: No Do you understand care options if your condition(s) worsen?: Yes-patient verbalized understanding    SIGNATURE Lisabeth Devoid, CMA

## 2023-03-01 ENCOUNTER — Ambulatory Visit: Payer: BC Managed Care – PPO | Admitting: Nurse Practitioner

## 2023-03-01 ENCOUNTER — Encounter: Payer: Self-pay | Admitting: Nurse Practitioner

## 2023-03-01 VITALS — BP 120/76 | HR 92 | Temp 97.7°F | Ht 66.0 in | Wt 196.4 lb

## 2023-03-01 DIAGNOSIS — E876 Hypokalemia: Secondary | ICD-10-CM

## 2023-03-01 DIAGNOSIS — E6609 Other obesity due to excess calories: Secondary | ICD-10-CM

## 2023-03-01 DIAGNOSIS — I25118 Atherosclerotic heart disease of native coronary artery with other forms of angina pectoris: Secondary | ICD-10-CM | POA: Diagnosis not present

## 2023-03-01 DIAGNOSIS — E1169 Type 2 diabetes mellitus with other specified complication: Secondary | ICD-10-CM | POA: Diagnosis not present

## 2023-03-01 DIAGNOSIS — E785 Hyperlipidemia, unspecified: Secondary | ICD-10-CM

## 2023-03-01 DIAGNOSIS — R531 Weakness: Secondary | ICD-10-CM | POA: Diagnosis not present

## 2023-03-01 DIAGNOSIS — Z6831 Body mass index (BMI) 31.0-31.9, adult: Secondary | ICD-10-CM

## 2023-03-01 MED ORDER — SEMAGLUTIDE(0.25 OR 0.5MG/DOS) 2 MG/3ML ~~LOC~~ SOPN
0.5000 mg | PEN_INJECTOR | SUBCUTANEOUS | 1 refills | Status: DC
Start: 2023-03-01 — End: 2023-08-28

## 2023-03-01 NOTE — Progress Notes (Signed)
Veronica Lynch, CMA,acting as a Neurosurgeon for [name], FNP.,have documented all relevant documentation on the behalf of [name], FNP,as directed by  [name], FNP while in the presence of [name], FNP.  Subjective:  Patient ID: Veronica Lynch , female    DOB: 29-Sep-1965 , 57 y.o.   MRN: [id number]  Chief Complaint  Patient presents with   Hospitalization Follow-up    HPI  Patient presents today for a hospital follow up, patient went to the hospital on 02/23/2023. She went to ER due to not being able to hold her own self up. "I felt zapped out". At home her chest cramped, and face kind of locked up. Patient reports since her visit she is still having low energy, patient wants to know what she can do for her energy to return.   When she took her blood sugar at home was 276. She has been taking the jardiance daily. Blood sugar was 163 this morning.   Patient reports compliance with medications, patient denies any headache, chest pain or SOB.  She admits getting dizzy off & on.  She reports only receiving 3 pills of the potassium chloride. She admits taking all pills. She was told to eat bananas - she already eats bananas daily. She is drinking 2-3 16 oz bottles of water day. Average blood sugars in last 7 days is 243, last 30 days 203. She had a complete hysterectomy in 1991.   Orthostatics performed.   She has been sleeping normal for her. Will sometimes wake up. This has happened in the past, the last time was in her 60's. She took a spoonful of sugar and felt better.      Past Medical History:  Diagnosis Date   CVA (cerebral vascular accident) (HCC) 2018   CVA (cerebral vascular accident) (HCC) 2017   Diabetes (HCC)    Hyperlipidemia    Hypertension    Shingles      Family History  Problem Relation Age of Onset   Hypertension Mother    Hyperlipidemia Mother    Osteoporosis Mother    Cancer Father    Sarcoidosis Sister    Diabetes Maternal Aunt       Current Outpatient Medications:    Ascorbic Acid (VITAMIN C) 1000 MG tablet, Take 1,000 mg by mouth daily., Disp: , Rfl:    Biotin 10 MG TABS, Take 10 mg by mouth daily., Disp: , Rfl:    celecoxib (CELEBREX) 200 MG capsule, TAKE 1 CAPSULE BY MOUTH EVERY DAY, Disp: 30 capsule, Rfl: 2   clopidogrel (PLAVIX) 75 MG tablet, Take 75 mg by mouth daily., Disp: , Rfl:    empagliflozin (JARDIANCE) 25 MG TABS tablet, Take 1 tablet (25 mg total) by mouth daily before breakfast., Disp: 90 tablet, Rfl: 1   Evolocumab (REPATHA SURECLICK) 140 MG/ML SOAJ, Inject 140 mg into the skin every 14 (fourteen) days., Disp: 6 mL, Rfl: 5   fluticasone (FLONASE) 50 MCG/ACT nasal spray, Place 1 spray into both nostrils 2 (two) times daily., Disp: 16 g, Rfl: 0   furosemide (LASIX) 20 MG tablet, TAKE 1 TABLET (20 MG TOTAL) BY MOUTH DAILY. AS NEEDED FOR LEG SWELLING, Disp: 90 tablet, Rfl: 1   hydrochlorothiazide (HYDRODIURIL) 25 MG tablet, Take 1 tablet (25 mg total) by mouth daily. As needed for leg swelling, Disp: 90 tablet, Rfl: 1   isosorbide mononitrate (IMDUR) 30 MG 24 hr tablet, TAKE 1 TABLET BY MOUTH EVERY DAY, Disp: 90 tablet, Rfl: 1  metoprolol succinate (TOPROL-XL) 50 MG 24 hr tablet, TAKE 1 TABLET BY MOUTH EVERY DAY WITH OR IMMEDIATELY FOLLOWING A MEAL, Disp: 90 tablet, Rfl: 1   nitroGLYCERIN (NITROSTAT) 0.4 MG SL tablet, Place 1 tablet (0.4 mg total) under the tongue every 5 (five) minutes as needed for chest pain., Disp: 30 tablet, Rfl: 2   ondansetron (ZOFRAN) 4 MG tablet, TAKE 1 TABLET BY MOUTH DAILY AS NEEDED FOR NAUSEA OR VOMITING., Disp: 18 tablet, Rfl: 1   Semaglutide,0.25 or 0.5MG /DOS, 2 MG/3ML SOPN, Inject 0.5 mg into the skin once a week., Disp: 9 mL, Rfl: 1   TRUE METRIX BLOOD GLUCOSE TEST test strip, 1 each by Other route daily. Check blood sugar once daily, Disp: 100 each, Rfl: 3   NOREL AD 4-10-325 MG TABS, TAKE 1 TABLET BY MOUTH DAILY (Patient not taking: Reported on 03/01/2023), Disp: 30 tablet,  Rfl: 0   potassium chloride SA (KLOR-CON M) 20 MEQ tablet, Take 1 tablet (20 mEq total) by mouth daily. (Patient not taking: Reported on 03/01/2023), Disp: 3 tablet, Rfl: 0   Vitamin D, Ergocalciferol, (DRISDOL) 1.25 MG (50000 UNIT) CAPS capsule, Take 1 capsule (50,000 Units total) by mouth every 7 (seven) days., Disp: 12 capsule, Rfl: 1   Allergies  Allergen Reactions   Bactrim [Sulfamethoxazole-Trimethoprim] Hives and Itching   Codeine Other (See Comments)    Pass out      Review of Systems  Constitutional: Negative.   Respiratory: Negative.    Cardiovascular: Negative.   Neurological: Negative.   Psychiatric/Behavioral: Negative.       Today's Vitals   03/01/23 0904 03/01/23 0908 03/01/23 0909  BP: 120/80 120/80 120/76  Pulse: 90 63 92  Temp: 97.7 F (36.5 C)    SpO2: 98%    Weight: 196 lb 6.4 oz (89.1 kg)    Height: 5\' 6"  (1.676 m)     Body mass index is 31.7 kg/m.  Wt Readings from Last 3 Encounters:  03/01/23 196 lb 6.4 oz (89.1 kg)  02/23/23 189 lb (85.7 kg)  01/30/23 198 lb 9.6 oz (90.1 kg)    The ASCVD Risk score (Arnett DK, et al., 2019) failed to calculate for the following reasons:   The patient has a prior MI or stroke diagnosis  Objective:  Physical Exam Vitals reviewed.  Constitutional:      General: She is not in acute distress.    Appearance: Normal appearance. She is obese.  Cardiovascular:     Rate and Rhythm: Normal rate and regular rhythm.     Pulses: Normal pulses.     Heart sounds: Normal heart sounds. No murmur heard. Pulmonary:     Effort: Pulmonary effort is normal. No respiratory distress.     Breath sounds: Normal breath sounds. No wheezing.  Skin:    General: Skin is warm and dry.     Capillary Refill: Capillary refill takes less than 2 seconds.  Neurological:     General: No focal deficit present.     Mental Status: She is alert and oriented to person, place, and time.     Cranial Nerves: No cranial nerve deficit.     Motor: No  weakness.  Psychiatric:        Mood and Affect: Mood normal.        Behavior: Behavior normal.        Thought Content: Thought content normal.        Judgment: Judgment normal.         Assessment And  Plan:  Weakness Assessment & Plan: Recent hospitalization for weakness, no abnormal findings to explain the weakness. Will check iron levels, thyroid peroxidase and vitamin d. Encouraged to stay well hydrated.   Orders: -     Iron, TIBC and Ferritin Panel -     Thyroid peroxidase antibody -     VITAMIN D 25 Hydroxy (Vit-D Deficiency, Fractures) -     BMP8+eGFR  Hypokalemia Assessment & Plan: Will recheck potassium level. Encouraged to eat high potassium foods.   Orders: -     BMP8+eGFR  Coronary artery disease of native artery of native heart with stable angina pectoris [facility]) Assessment & Plan: Continue repatha. Statin intolerant. Will also try to get her approved for [facility]. Discussed side effects to include nausea and constipation. She can take a stool softner up to daily as needed.    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   Orders: -     Semaglutide(0.25 or 0.5MG /DOS); Inject 0.5 mg into the skin once a week.  Dispense: 9 mL; Refill: 1  Class 1 obesity due to excess calories with serious comorbidity and body mass index (BMI) of 31.0 to 31.9 in adult Assessment & Plan: She is encouraged to strive for BMI less than 30 to decrease cardiac risk. Advised to aim for at least 150 minutes of exercise per week as tolerated.      Return if symptoms worsen or fail to improve, for keep same next appt..  Patient was given opportunity to ask questions. Patient verbalized understanding of the plan and was able to repeat key elements of the plan. All questions were answered to their satisfaction.     Jeanell Sparrow, FNP, have reviewed all documentation for this visit. The documentation on 03/01/23 for the exam, diagnosis, procedures, and orders are all accurate and complete.   IF YOU HAVE BEEN REFERRED TO A SPECIALIST, IT MAY TAKE 1-2 WEEKS TO SCHEDULE/PROCESS THE REFERRAL. IF YOU HAVE NOT HEARD FROM US/SPECIALIST IN TWO WEEKS, PLEASE GIVE [place] A CALL AT [phone].

## 2023-03-01 NOTE — Patient Instructions (Addendum)
Will start Ozempic 0.25 mg for 4 weeks then increase to 0.5 mg for 2 weeks the initial pen will last for 6 weeks.  Side effects to include nausea, difficulty swallowing and abdominal pain. When you are full from eating stop and avoid fried, fatty and sweets.

## 2023-03-02 LAB — IRON,TIBC AND FERRITIN PANEL
Ferritin: 184 ng/mL — ABNORMAL HIGH (ref 15–150)
Iron Saturation: 23 % (ref 15–55)
Iron: 79 ug/dL (ref 27–159)
Total Iron Binding Capacity: 338 ug/dL (ref 250–450)
UIBC: 259 ug/dL (ref 131–425)

## 2023-03-02 LAB — BMP8+EGFR
BUN/Creatinine Ratio: 15 (ref 9–23)
BUN: 12 mg/dL (ref 6–24)
CO2: 24 mmol/L (ref 20–29)
Calcium: 9.9 mg/dL (ref 8.7–10.2)
Chloride: 104 mmol/L (ref 96–106)
Creatinine, Ser: 0.79 mg/dL (ref 0.57–1.00)
Glucose: 166 mg/dL — ABNORMAL HIGH (ref 70–99)
Potassium: 4.8 mmol/L (ref 3.5–5.2)
Sodium: 144 mmol/L (ref 134–144)
eGFR: 87 mL/min/{1.73_m2} (ref >59)

## 2023-03-02 LAB — THYROID PEROXIDASE ANTIBODY: Thyroperoxidase Ab SerPl-aCnc: 15 [IU]/mL (ref 0–34)

## 2023-03-02 LAB — VITAMIN D 25 HYDROXY (VIT D DEFICIENCY, FRACTURES): Vit D, 25-Hydroxy: 27.9 ng/mL — ABNORMAL LOW (ref 30.0–100.0)

## 2023-03-04 ENCOUNTER — Other Ambulatory Visit: Payer: Self-pay | Admitting: Nurse Practitioner

## 2023-03-04 ENCOUNTER — Encounter: Payer: Self-pay | Admitting: Nurse Practitioner

## 2023-03-04 DIAGNOSIS — E559 Vitamin D deficiency, unspecified: Secondary | ICD-10-CM

## 2023-03-04 MED ORDER — VITAMIN D (ERGOCALCIFEROL) 1.25 MG (50000 UNIT) PO CAPS
50000.0000 [IU] | ORAL_CAPSULE | ORAL | 1 refills | Status: DC
Start: 2023-03-04 — End: 2023-05-20

## 2023-03-12 ENCOUNTER — Ambulatory Visit: Admission: RE | Admit: 2023-03-12 | Payer: BC Managed Care – PPO | Source: Ambulatory Visit

## 2023-03-12 DIAGNOSIS — Z1231 Encounter for screening mammogram for malignant neoplasm of breast: Secondary | ICD-10-CM

## 2023-03-12 LAB — AMB RESULTS CONSOLE CBG: Glucose: 141

## 2023-03-12 NOTE — Progress Notes (Signed)
Please follow up on PCP need. No SDOH needs

## 2023-03-15 DIAGNOSIS — E6609 Other obesity due to excess calories: Secondary | ICD-10-CM | POA: Insufficient documentation

## 2023-03-15 NOTE — Assessment & Plan Note (Signed)
Continue Jardiance, HgbA1c is slightly improving.

## 2023-03-15 NOTE — Assessment & Plan Note (Addendum)
Continue repatha. Statin intolerant. Will also try to get her approved for [facility]. Discussed side effects to include nausea and constipation. She can take a stool softner up to daily as needed.

## 2023-03-15 NOTE — Assessment & Plan Note (Signed)
Will recheck potassium level. Encouraged to eat high potassium foods.

## 2023-03-15 NOTE — Assessment & Plan Note (Signed)
Continue repatha.  

## 2023-03-15 NOTE — Assessment & Plan Note (Signed)
Recent hospitalization for weakness, no abnormal findings to explain the weakness. Will check iron levels, thyroid peroxidase and vitamin d. Encouraged to stay well hydrated.

## 2023-03-15 NOTE — Assessment & Plan Note (Signed)
She is encouraged to strive for BMI less than 30 to decrease cardiac risk. Advised to aim for at least 150 minutes of exercise per week as tolerated.

## 2023-03-27 LAB — HM DIABETES EYE EXAM

## 2023-04-10 NOTE — Progress Notes (Signed)
 The patient attended 03/12/23 screening event where her BP screening results were wnl. Her blood glucose was 141 mg/dl, one mg/dl out of normal range. At the event the patient noted she has insurance. Nothing was listed for sdoh insecurities.   Per chart review pt has a pcp and the last office visit was 03/01/23. Chart review also indicates that the pcp and pt is aware of the elevated blood sugar and is taking jardiance  daily. Type two diabetes is listed in the pts problem list. Chart review also indicates a future appt with pcp on 05/07/23 and 08/06/23. No additional Health equity team support indicated at this time.

## 2023-04-18 ENCOUNTER — Encounter: Payer: Self-pay | Admitting: Cardiology

## 2023-04-18 ENCOUNTER — Ambulatory Visit: Payer: BC Managed Care – PPO | Attending: Cardiology | Admitting: Cardiology

## 2023-04-18 DIAGNOSIS — I25118 Atherosclerotic heart disease of native coronary artery with other forms of angina pectoris: Secondary | ICD-10-CM

## 2023-04-18 DIAGNOSIS — E782 Mixed hyperlipidemia: Secondary | ICD-10-CM | POA: Diagnosis not present

## 2023-04-18 MED ORDER — REPATHA SURECLICK 140 MG/ML ~~LOC~~ SOAJ
140.0000 mg | SUBCUTANEOUS | 5 refills | Status: AC
Start: 2023-04-18 — End: ?

## 2023-04-18 MED ORDER — ISOSORBIDE MONONITRATE ER 30 MG PO TB24: 30.0000 mg | ORAL_TABLET | Freq: Every day | ORAL | 3 refills | Status: DC

## 2023-04-18 MED ORDER — METOPROLOL SUCCINATE ER 50 MG PO TB24
50.0000 mg | ORAL_TABLET | Freq: Every day | ORAL | 3 refills | Status: DC
Start: 2023-04-18 — End: 2024-05-13

## 2023-04-18 MED ORDER — CLOPIDOGREL BISULFATE 75 MG PO TABS: 75.0000 mg | ORAL_TABLET | Freq: Every day | ORAL | 3 refills | Status: AC

## 2023-04-18 NOTE — Patient Instructions (Signed)
Medication Instructions:  Your physician recommends that you continue on your current medications as directed. Please refer to the Current Medication list given to you today.  *If you need a refill on your cardiac medications before your next appointment, please call your pharmacy*   Lab Work: None.  If you have labs (blood work) drawn today and your tests are completely normal, you will receive your results only by: MyChart Message (if you have MyChart) OR A paper copy in the mail If you have any lab test that is abnormal or we need to change your treatment, we will call you to review the results.   Testing/Procedures: None.   Follow-Up:  Your next appointment:   1 year(s)  Provider:   Dr. Truett Mainland

## 2023-04-18 NOTE — Progress Notes (Signed)
Cardiology Office Note:  .   Date:  04/18/2023  ID:  Veronica Lynch, DOB [birth year], MRN [id number] PCP: [name], FNP  [facility] Providers Cardiologist:  Truett Mainland, MD PCP: [name], FNP  Chief Complaint  Patient presents with   Coronary Artery Disease     History of Present Illness: Marland Kitchen    Veronica Lynch is a 57 y.o. female with hypertension, mixed hyperlipidemia, h/o stroke (2018), CAD s/p pLCx PCI, known RCA CTO   Patient is doing well.  She has not had any recurrent chest pain on current antianginal regimen.  She was recently seen in ER for generalized weakness.  She underwent MRI brain for concern for stroke, which was fortunately normal. Reviewed recent test results with the patient, details below.    Vitals:   04/18/23 1409  BP: 116/64  Pulse: 70  SpO2: 97%     ROS:  Review of Systems  Cardiovascular:  Negative for chest pain, dyspnea on exertion, leg swelling, palpitations and syncope.     Studies Reviewed: Marland Kitchen       MRI brain 02/23/2023: Normal  01/30/2023: Chol 142, TG 148, HDL 60, LDL 57   Physical Exam:   Physical Exam Vitals and nursing note reviewed.  Constitutional:      General: She is not in acute distress. Neck:     Vascular: No JVD.  Cardiovascular:     Rate and Rhythm: Normal rate and regular rhythm.     Heart sounds: Normal heart sounds. No murmur heard. Pulmonary:     Effort: Pulmonary effort is normal.     Breath sounds: Normal breath sounds. No wheezing or rales.  Musculoskeletal:     Right lower leg: No edema.     Left lower leg: No edema.      VISIT DIAGNOSES:   ICD-10-CM   1. Coronary artery disease of native artery of native heart with stable angina pectoris (HCC)  I25.118 metoprolol succinate (TOPROL-XL) 50 MG 24 hr tablet    Evolocumab (REPATHA SURECLICK) 140 MG/ML SOAJ    2. Mixed hyperlipidemia  E78.2 Evolocumab (REPATHA SURECLICK) 140 MG/ML SOAJ       ASSESSMENT AND PLAN: .    Veronica Lynch is a 57 y.o. female with hypertension, mixed hyperlipidemia, h/o stroke (2018), CAD s/p pLCx PCI, known RCA CTO   CAD: RCA CTO. Successful pLCx PCI (04/2021). S/p pLCx PCI, known RCA CTO Recent exertional angina and dyspnea symptoms. No ischemia on stress testing (11/2022), normal EF, mild LVH, grade 1 DD (echocardiogram 12/2022). Currently on metoprolol succinate 50 mg daily, Imdur 30 mg daily. Continue the same, Completed 6 months DAPT. Tolerating Plavix monotherapy. Chol 212, TG 228, HDL 56, LDL 116 (09/2022) Chol 142, TG 148, HDL 60, LDL 57 (01/2023). Continue Repatha.   Hypertension: Controlled   Mixed hyperlipidemia: Management as above   Meds ordered this encounter  Medications   clopidogrel (PLAVIX) 75 MG tablet    Sig: Take 1 tablet (75 mg total) by mouth daily.    Dispense:  90 tablet    Refill:  3   metoprolol succinate (TOPROL-XL) 50 MG 24 hr tablet    Sig: Take 1 tablet (50 mg total) by mouth daily. Take with or immediately following a meal.    Dispense:  90 tablet    Refill:  3   Evolocumab (REPATHA SURECLICK) 140 MG/ML SOAJ    Sig: Inject 140 mg into the skin every 14 (fourteen) days.  Dispense:  6 mL    Refill:  5    Your PA request has been approved. Additional information will be provided in the approval communication   isosorbide mononitrate (IMDUR) 30 MG 24 hr tablet    Sig: Take 1 tablet (30 mg total) by mouth daily.    Dispense:  90 tablet    Refill:  3     F/u in 1 year  Signed, Elder Negus, MD

## 2023-05-06 NOTE — Progress Notes (Signed)
[name], CMA,acting as a Neurosurgeon for Veronica Felts, FNP.,have documented all relevant documentation on the behalf of Veronica Felts, FNP,as directed by  Veronica Felts, FNP while in the presence of Veronica Felts, FNP.  Subjective:    Patient ID: Veronica Lynch , female    DOB: 1966-01-12 , 57 y.o.   MRN: [id number]  Chief Complaint  Patient presents with   Annual Exam    HPI  Patient presents today for HM, Patient reports compliance with medication. Patient denies any chest pain, SOB, or headaches. Patient already had EKG. Patient reports her right arm is in a lot of pain she can't do much range of motion, she reports it goes numb and sometimes has shooting pains.   She has been to Cardiology and next appt is in one year.   BP Readings from Last 3 Encounters: 05/07/23 : 120/80 04/18/23 : 116/64 03/12/23 : 120/70    Shoulder Pain  The pain is present in the right shoulder. This is a new problem. The current episode started more than 1 year ago. The quality of the pain is described as aching. Associated symptoms include a limited range of motion (when she cleans her shoulder should not bother her) and numbness. Pertinent negatives include no fever or stiffness. The symptoms are aggravated by lying down. She has tried OTC ointments and OTC pain meds for the symptoms.     Past Medical History:  Diagnosis Date   Allergy    CVA (cerebral vascular accident) (HCC) 2018   CVA (cerebral vascular accident) (HCC) 2017   Diabetes (HCC)    Exertional chest pain 11/17/2020   Exertional dyspnea 11/17/2020   GERD (gastroesophageal reflux disease) 2010   Hyperlipidemia    Hypertension    Palpitations 11/17/2020   Shingles      Family History  Problem Relation Age of Onset   Hypertension Mother    Hyperlipidemia Mother    Osteoporosis Mother    Cancer Father    Sarcoidosis Sister    Diabetes Maternal Aunt      Current Outpatient Medications:    Ascorbic Acid (VITAMIN C) 1000 MG  tablet, Take 1,000 mg by mouth daily., Disp: , Rfl:    Biotin 10 MG TABS, Take 10 mg by mouth daily., Disp: , Rfl:    celecoxib (CELEBREX) 200 MG capsule, TAKE 1 CAPSULE BY MOUTH EVERY DAY, Disp: 30 capsule, Rfl: 2   clopidogrel (PLAVIX) 75 MG tablet, Take 1 tablet (75 mg total) by mouth daily., Disp: 90 tablet, Rfl: 3   empagliflozin (JARDIANCE) 25 MG TABS tablet, Take 1 tablet (25 mg total) by mouth daily before breakfast., Disp: 90 tablet, Rfl: 1   Evolocumab (REPATHA SURECLICK) 140 MG/ML SOAJ, Inject 140 mg into the skin every 14 (fourteen) days., Disp: 6 mL, Rfl: 5   fluticasone (FLONASE) 50 MCG/ACT nasal spray, Place 1 spray into both nostrils 2 (two) times daily., Disp: 16 g, Rfl: 0   isosorbide mononitrate (IMDUR) 30 MG 24 hr tablet, Take 1 tablet (30 mg total) by mouth daily., Disp: 90 tablet, Rfl: 3   metoprolol succinate (TOPROL-XL) 50 MG 24 hr tablet, Take 1 tablet (50 mg total) by mouth daily. Take with or immediately following a meal., Disp: 90 tablet, Rfl: 3   nitroGLYCERIN (NITROSTAT) 0.4 MG SL tablet, Place 1 tablet (0.4 mg total) under the tongue every 5 (five) minutes as needed for chest pain., Disp: 30 tablet, Rfl: 2   NOREL AD 4-10-325 MG TABS, TAKE 1  TABLET BY MOUTH DAILY, Disp: 30 tablet, Rfl: 0   ondansetron (ZOFRAN) 4 MG tablet, TAKE 1 TABLET BY MOUTH DAILY AS NEEDED FOR NAUSEA OR VOMITING., Disp: 18 tablet, Rfl: 1   Semaglutide,0.25 or 0.5MG /DOS, 2 MG/3ML SOPN, Inject 0.5 mg into the skin once a week., Disp: 9 mL, Rfl: 1   TRUE METRIX BLOOD GLUCOSE TEST test strip, 1 each by Other route daily. Check blood sugar once daily, Disp: 100 each, Rfl: 3   Vitamin D, Ergocalciferol, (DRISDOL) 1.25 MG (50000 UNIT) CAPS capsule, Take 1 capsule (50,000 Units total) by mouth every 7 (seven) days., Disp: 12 capsule, Rfl: 1   furosemide (LASIX) 20 MG tablet, Take 1 tablet (20 mg total) by mouth daily. As needed for leg swelling may repeat x 1 if not improved, Disp: 180 tablet, Rfl: 1    Allergies  Allergen Reactions   Bactrim [Sulfamethoxazole-Trimethoprim] Hives and Itching   Codeine Other (See Comments)    Pass out       The patient states she is status post hysterectomy.   No LMP recorded. Patient has had a hysterectomy.   Negative for: breast discharge, breast lump(s), breast pain and breast self exam. Associated symptoms include abnormal vaginal bleeding. Pertinent negatives include abnormal bleeding (hematology), anxiety, decreased libido, depression, difficulty falling sleep, dyspareunia, history of infertility, nocturia, sexual dysfunction, sleep disturbances, urinary incontinence, urinary urgency, vaginal discharge and vaginal itching. Diet regular; she is cutting back on meat and eating more fruits and vegetables.  The patient states her exercise level is moderate - she will walk at least 30 minutes 2-3 times a day.   The patient's tobacco use is:  Social History   Tobacco Use  Smoking Status Never  Smokeless Tobacco Never   She has been exposed to passive smoke. The patient's alcohol use is:  Social History   Substance and Sexual Activity  Alcohol Use Not Currently   Alcohol/week: 1.0 standard drink of alcohol   Types: 1 Glasses of wine per week   Comment: occ wine   Review of Systems  Constitutional: Negative.  Negative for fever.  HENT: Negative.    Eyes: Negative.   Respiratory: Negative.    Cardiovascular: Negative.   Gastrointestinal: Negative.   Endocrine: Negative.   Genitourinary: Negative.   Musculoskeletal: Negative.  Negative for stiffness.  Skin: Negative.   Allergic/Immunologic: Negative.   Neurological:  Positive for numbness.  Hematological: Negative.   Psychiatric/Behavioral: Negative.       Title   Diabetic Foot Exam - detailed Date & Time: 05/07/2023  3:24 PM Diabetic Foot exam was performed with the following findings: Yes  Visual Foot Exam completed.: Yes  Is there a history of foot ulcer?: No Is there a foot ulcer  now?: No Is there swelling?: Yes Is there elevated skin temperature?: No Is there abnormal foot shape?: No Is there a claw toe deformity?: No Are the toenails long?: No Are the toenails thick?: No Are the toenails ingrown?: No Is the skin thin, fragile, shiny and hairless?": No Normal Range of Motion?: Yes Is there foot or ankle muscle weakness?: No Do you have pain in calf while walking?: No Are the shoes appropriate in style and fit?: Yes Can the patient see the bottom of their feet?: Yes Pulse Foot Exam completed.: Yes   Right Posterior Tibialis: Present Left posterior Tibialis: Present   Right Dorsalis Pedis: Present Left Dorsalis Pedis: Present     Semmes-Weinstein Monofilament Test "+" means "has sensation" and "-" means "  no sensation"   R Site 1-Great Toe: Pos L Site 1-Great Toe: Pos   R Site 4: Pos L Site 4: Pos   R Site 6: Pos L Site 6: Pos     Image components are not supported.   Image components are not supported. Image components are not supported.  [place] Comments Decreased sensation to left #4 area due to callous     Today's Vitals   05/07/23 1450  BP: 120/80  Pulse: 88  Temp: 98.4 F (36.9 C)  TempSrc: Oral  Weight: 191 lb 9.6 oz (86.9 kg)  Height: 5\' 6"  (1.676 m)  PainSc: 0-No pain   Body mass index is 30.93 kg/m.  Wt Readings from Last 3 Encounters:  05/07/23 191 lb 9.6 oz (86.9 kg)  04/18/23 194 lb (88 kg)  03/01/23 196 lb 6.4 oz (89.1 kg)     Objective:  Physical Exam Vitals reviewed.  Constitutional:      General: She is not in acute distress.    Appearance: Normal appearance. She is well-developed. She is obese.  HENT:     Head: Normocephalic and atraumatic.     Right Ear: Hearing, tympanic membrane, ear canal and external ear normal. There is no impacted cerumen.     Left Ear: Hearing, tympanic membrane, ear canal and external ear normal. There is no impacted cerumen.     Nose: Nose normal.     Mouth/Throat:     Mouth:  Mucous membranes are moist.  Eyes:     General: Lids are normal.     Extraocular Movements: Extraocular movements intact.     Conjunctiva/sclera: Conjunctivae normal.     Pupils: Pupils are equal, round, and reactive to light.     Funduscopic exam:    Right eye: No papilledema.        Left eye: No papilledema.  Neck:     Thyroid: No thyroid mass.     Vascular: No carotid bruit.  Cardiovascular:     Rate and Rhythm: Normal rate and regular rhythm.     Pulses: Normal pulses.     Heart sounds: Normal heart sounds. No murmur heard. Pulmonary:     Effort: Pulmonary effort is normal. No respiratory distress.     Breath sounds: Normal breath sounds. No wheezing.  Chest:     Chest wall: No mass.  Breasts:    Tanner Score is 5.     Right: Normal. No mass or tenderness.     Left: Normal. No mass or tenderness.  Abdominal:     General: Abdomen is flat. Bowel sounds are normal. There is no distension.     Palpations: Abdomen is soft.     Tenderness: There is no abdominal tenderness.  Genitourinary:    Comments: Deferred - followed by GYN Musculoskeletal:        General: Tenderness (right shoulder anterior bursa) present. No swelling.     Cervical back: Full passive range of motion without pain, normal range of motion and neck supple.     Right lower leg: Edema (trace) present.     Left lower leg: Edema (trace) present.  Lymphadenopathy:     Upper Body:     Right upper body: No supraclavicular, axillary or pectoral adenopathy.     Left upper body: No supraclavicular, axillary or pectoral adenopathy.  Skin:    General: Skin is warm and dry.     Capillary Refill: Capillary refill takes less than 2 seconds.     Comments:  Callous to left plantar surface area inferior to great toe  Neurological:     General: No focal deficit present.     Mental Status: She is alert and oriented to person, place, and time.     Cranial Nerves: No cranial nerve deficit.     Sensory: No sensory deficit.      Motor: No weakness.  Psychiatric:        Mood and Affect: Mood normal.        Behavior: Behavior normal.        Thought Content: Thought content normal.        Judgment: Judgment normal.         Assessment And Plan:     Encounter for annual health examination Assessment & Plan: Behavior modifications discussed and diet history reviewed.   Pt will continue to exercise regularly and modify diet with low GI, plant based foods and decrease intake of processed foods.  Recommend intake of daily multivitamin, Vitamin D, and calcium.  Recommend mammogram and colonoscopy for preventive screenings, as well as recommend immunizations that include influenza, TDAP, and Shingles    Vitamin D deficiency Assessment & Plan: Will check vitamin D level and supplement as needed.    Also encouraged to spend 15 minutes in the sun daily.     Type 2 diabetes mellitus with hyperlipidemia (HCC) Assessment & Plan: Continue Jardiance, HgbA1c is slightly improving.   Orders: -     POCT URINALYSIS DIP (CLINITEK) -     Microalbumin / creatinine urine ratio -     Hemoglobin A1c -     Hepatic function panel  Mixed hyperlipidemia Assessment & Plan: Cholesterol levels are stable, continue statin.  Orders: -     Hemoglobin A1c -     Hepatic function panel  Hypertensive heart disease without heart failure Assessment & Plan: Blood pressure is controlled right at border for diastolic, continue current medications  Orders: -     Lipid panel -     Hepatic function panel  Acute pain of right shoulder Assessment & Plan: Decreased range of motion and tenderness to anterior bursa, will refer to orthopedics for further evaluation  Orders: -     Ambulatory referral to Orthopedics  Right arm numbness Assessment & Plan: May have a radiculopathy component since having right shoulder pain, negative tinel and phalen.    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 As needed for leg swelling may repeat x 1 if not improved  Dispense: 180 tablet; Refill: 1     Return for 1 year physical, 4 month bp check and DM check. Patient was given opportunity to ask questions. Patient verbalized understanding of the plan and was able to repeat key elements of the plan. All questions were answered to their satisfaction.   Veronica Felts, FNP  I, Veronica Felts, FNP, have reviewed all documentation for this visit. The documentation on 05/07/23 for the exam, diagnosis, procedures, and orders are all accurate and complete.

## 2023-05-07 ENCOUNTER — Ambulatory Visit (INDEPENDENT_AMBULATORY_CARE_PROVIDER_SITE_OTHER): Payer: BC Managed Care – PPO | Admitting: Nurse Practitioner

## 2023-05-07 ENCOUNTER — Encounter: Payer: Self-pay | Admitting: Nurse Practitioner

## 2023-05-07 VITALS — BP 120/80 | HR 88 | Temp 98.4°F | Ht 66.0 in | Wt 191.6 lb

## 2023-05-07 DIAGNOSIS — R200 Anesthesia of skin: Secondary | ICD-10-CM

## 2023-05-07 DIAGNOSIS — Z0000 Encounter for general adult medical examination without abnormal findings: Secondary | ICD-10-CM

## 2023-05-07 DIAGNOSIS — E1169 Type 2 diabetes mellitus with other specified complication: Secondary | ICD-10-CM | POA: Diagnosis not present

## 2023-05-07 DIAGNOSIS — I10 Essential (primary) hypertension: Secondary | ICD-10-CM

## 2023-05-07 DIAGNOSIS — I25118 Atherosclerotic heart disease of native coronary artery with other forms of angina pectoris: Secondary | ICD-10-CM

## 2023-05-07 DIAGNOSIS — I119 Hypertensive heart disease without heart failure: Secondary | ICD-10-CM

## 2023-05-07 DIAGNOSIS — E785 Hyperlipidemia, unspecified: Secondary | ICD-10-CM | POA: Diagnosis not present

## 2023-05-07 DIAGNOSIS — T466X5A Adverse effect of antihyperlipidemic and antiarteriosclerotic drugs, initial encounter: Secondary | ICD-10-CM

## 2023-05-07 DIAGNOSIS — E782 Mixed hyperlipidemia: Secondary | ICD-10-CM | POA: Diagnosis not present

## 2023-05-07 DIAGNOSIS — Z79899 Other long term (current) drug therapy: Secondary | ICD-10-CM

## 2023-05-07 DIAGNOSIS — M25511 Pain in right shoulder: Secondary | ICD-10-CM

## 2023-05-07 DIAGNOSIS — M609 Myositis, unspecified: Secondary | ICD-10-CM

## 2023-05-07 DIAGNOSIS — E559 Vitamin D deficiency, unspecified: Secondary | ICD-10-CM

## 2023-05-07 LAB — POCT URINALYSIS DIP (CLINITEK)
Bilirubin, UA: NEGATIVE
Blood, UA: NEGATIVE
Glucose, UA: NEGATIVE mg/dL
Ketones, POC UA: NEGATIVE mg/dL
Leukocytes, UA: NEGATIVE
Nitrite, UA: NEGATIVE
POC PROTEIN,UA: NEGATIVE
Spec Grav, UA: 1.015 (ref 1.010–1.025)
Urobilinogen, UA: 0.2 U/dL
pH, UA: 6.5 (ref 5.0–8.0)

## 2023-05-07 MED ORDER — FUROSEMIDE 20 MG PO TABS: 20.0000 mg | ORAL_TABLET | Freq: Every day | ORAL | 1 refills | Status: AC

## 2023-05-07 MED ORDER — FUROSEMIDE 40 MG PO TABS: 40.0000 mg | ORAL_TABLET | Freq: Every day | ORAL | 1 refills | Status: DC

## 2023-05-07 NOTE — Patient Instructions (Addendum)
If your pain to your shoulder gets worse you can go to [facility] during day time hours or [facility] after hours . You can check for a dentist - [facility], Dr. Darrol Jump or Dr. Tristan Schroeder.

## 2023-05-08 LAB — MICROALBUMIN / CREATININE URINE RATIO
Creatinine, Urine: 55.3 mg/dL
Microalb/Creat Ratio: <5 mg/g{creat} (ref 0–29)
Microalbumin, Urine: <3 ug/mL

## 2023-05-08 LAB — CBC WITH DIFFERENTIAL/PLATELET
Basophils Absolute: 0 10*3/uL (ref 0.0–0.2)
Basos: 0 %
EOS (ABSOLUTE): 0.1 10*3/uL (ref 0.0–0.4)
Eos: 1 %
Hematocrit: 38 % (ref 34.0–46.6)
Hemoglobin: 12.5 g/dL (ref 11.1–15.9)
Immature Grans (Abs): 0 10*3/uL (ref 0.0–0.1)
Immature Granulocytes: 0 %
Lymphocytes Absolute: 1.8 10*3/uL (ref 0.7–3.1)
Lymphs: 35 %
MCH: 28.9 pg (ref 26.6–33.0)
MCHC: 32.9 g/dL (ref 31.5–35.7)
MCV: 88 fL (ref 79–97)
Monocytes Absolute: 0.4 10*3/uL (ref 0.1–0.9)
Monocytes: 7 %
Neutrophils Absolute: 2.8 10*3/uL (ref 1.4–7.0)
Neutrophils: 57 %
Platelets: 287 10*3/uL (ref 150–450)
RBC: 4.32 x10E6/uL (ref 3.77–5.28)
RDW: 12.7 % (ref 11.7–15.4)
WBC: 5 10*3/uL (ref 3.4–10.8)

## 2023-05-08 LAB — LIPID PANEL
Chol/HDL Ratio: 5.3 ratio — ABNORMAL HIGH (ref 0.0–4.4)
Cholesterol, Total: 251 mg/dL — ABNORMAL HIGH (ref 100–199)
HDL: 47 mg/dL (ref >39)
LDL Chol Calc (NIH): 176 mg/dL — ABNORMAL HIGH (ref 0–99)
Triglycerides: 152 mg/dL — ABNORMAL HIGH (ref 0–149)
VLDL Cholesterol Cal: 28 mg/dL (ref 5–40)

## 2023-05-08 LAB — HEPATIC FUNCTION PANEL
ALT: 16 [IU]/L (ref 0–32)
AST: 18 [IU]/L (ref 0–40)
Albumin: 4.4 g/dL (ref 3.8–4.9)
Alkaline Phosphatase: 71 [IU]/L (ref 44–121)
Bilirubin Total: 0.5 mg/dL (ref 0.0–1.2)
Bilirubin, Direct: 0.12 mg/dL (ref 0.00–0.40)
Total Protein: 7.2 g/dL (ref 6.0–8.5)

## 2023-05-08 LAB — HEMOGLOBIN A1C
Est. average glucose Bld gHb Est-mCnc: 140 mg/dL
Hgb A1c MFr Bld: 6.5 % — ABNORMAL HIGH (ref 4.8–5.6)

## 2023-05-10 ENCOUNTER — Encounter: Payer: Self-pay | Admitting: Nurse Practitioner

## 2023-05-13 ENCOUNTER — Encounter: Payer: Self-pay | Admitting: Nurse Practitioner

## 2023-05-13 DIAGNOSIS — M25511 Pain in right shoulder: Secondary | ICD-10-CM | POA: Insufficient documentation

## 2023-05-13 DIAGNOSIS — R200 Anesthesia of skin: Secondary | ICD-10-CM | POA: Insufficient documentation

## 2023-05-13 DIAGNOSIS — Z0000 Encounter for general adult medical examination without abnormal findings: Secondary | ICD-10-CM | POA: Insufficient documentation

## 2023-05-13 DIAGNOSIS — E559 Vitamin D deficiency, unspecified: Secondary | ICD-10-CM | POA: Insufficient documentation

## 2023-05-13 NOTE — Assessment & Plan Note (Signed)

## 2023-05-13 NOTE — Assessment & Plan Note (Signed)
Will check vitamin D level and supplement as needed.    Also encouraged to spend 15 minutes in the sun daily.   

## 2023-05-13 NOTE — Assessment & Plan Note (Signed)
Decreased range of motion and tenderness to anterior bursa, will refer to orthopedics for further evaluation

## 2023-05-13 NOTE — Assessment & Plan Note (Addendum)
Statin intolerance continue repatha

## 2023-05-13 NOTE — Assessment & Plan Note (Signed)
May have a radiculopathy component since having right shoulder pain, negative tinel and phalen.

## 2023-05-13 NOTE — Assessment & Plan Note (Signed)
Blood pressure is controlled right at border for diastolic, continue current medications

## 2023-05-13 NOTE — Assessment & Plan Note (Signed)
Continue Jardiance, HgbA1c is slightly improving.

## 2023-05-13 NOTE — Assessment & Plan Note (Signed)
Cholesterol levels are stable, continue statin 

## 2023-05-20 ENCOUNTER — Encounter: Payer: Self-pay | Admitting: Physician Assistant

## 2023-05-20 ENCOUNTER — Ambulatory Visit: Payer: BC Managed Care – PPO | Admitting: Physician Assistant

## 2023-05-20 ENCOUNTER — Other Ambulatory Visit: Payer: Self-pay | Admitting: Radiology

## 2023-05-20 ENCOUNTER — Other Ambulatory Visit (INDEPENDENT_AMBULATORY_CARE_PROVIDER_SITE_OTHER): Payer: Self-pay

## 2023-05-20 ENCOUNTER — Telehealth: Payer: Self-pay | Admitting: Pharmacy Technician

## 2023-05-20 ENCOUNTER — Other Ambulatory Visit (INDEPENDENT_AMBULATORY_CARE_PROVIDER_SITE_OTHER): Payer: BC Managed Care – PPO

## 2023-05-20 DIAGNOSIS — M25511 Pain in right shoulder: Secondary | ICD-10-CM

## 2023-05-20 DIAGNOSIS — M542 Cervicalgia: Secondary | ICD-10-CM | POA: Diagnosis not present

## 2023-05-20 DIAGNOSIS — G8929 Other chronic pain: Secondary | ICD-10-CM | POA: Diagnosis not present

## 2023-05-20 DIAGNOSIS — M5412 Radiculopathy, cervical region: Secondary | ICD-10-CM

## 2023-05-20 MED ORDER — CYCLOBENZAPRINE HCL 10 MG PO TABS: 10.0000 mg | ORAL_TABLET | Freq: Three times a day (TID) | ORAL | 0 refills | Status: DC | PRN

## 2023-05-20 NOTE — Progress Notes (Deleted)
Office Visit Note   Patient: Veronica Lynch           Date of Birth: 1965/08/23           MRN: [id number] Visit Date: 05/20/2023              Requested by: [name], FNP [address] [place],  [place] [address] PCP: [name], FNP   Assessment & Plan: Visit Diagnoses:  1. Neck pain   2. Chronic right shoulder pain     Plan: ***  Follow-Up Instructions: Return in about 4 weeks (around 06/17/2023).   Orders:  Orders Placed This Encounter  Procedures  . XR Cervical Spine 2 or 3 views  . XR Shoulder Right   Meds ordered this encounter  Medications  . cyclobenzaprine (FLEXERIL) 10 MG tablet    Sig: Take 1 tablet (10 mg total) by mouth 3 (three) times daily as needed for muscle spasms.    Dispense:  30 tablet    Refill:  0      Procedures: No procedures performed   Clinical Data: No additional findings.   Subjective: Chief Complaint  Patient presents with  . Right Shoulder - Pain    HPI  Review of Systems   Objective: Vital Signs: There were no vitals taken for this visit.  Physical Exam  Ortho Exam  Specialty Comments:  No specialty comments available.  Imaging: XR Shoulder Right  Result Date: 05/20/2023 Right shoulder 3 views: Shoulder is well located.  No acute fracture.  Glenohumeral joint is well-maintained.  No significant arthritic changes.  XR Cervical Spine 2 or 3 views  Result Date: 05/20/2023 Cervical spine 2 views: No acute fractures.  Disc base overall well-maintained.  Anterior vertebral spurring at C3 and C5.  Otherwise no significant degenerative changes.     PMFS History: Patient Active Problem List   Diagnosis Date Noted  . Encounter for annual health examination 05/13/2023  . Vitamin D deficiency 05/13/2023  . Acute pain of right shoulder 05/13/2023  . Right arm numbness 05/13/2023  . Class 1 obesity due to excess calories with serious comorbidity and body mass index (BMI) of 31.0 to 31.9 in adult  03/15/2023  . Hypokalemia 03/01/2023  . Weakness 03/01/2023  . 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 Encounter for dental examination 01/30/2023  . Encounter for screening mammogram for breast cancer 01/30/2023  . Class 1 obesity due to excess calories without serious comorbidity with body mass index (BMI) of 32.0 to 32.9 in adult 01/30/2023  . Type 2 diabetes mellitus with hyperlipidemia (HCC) 01/30/2023  . Elevated coronary artery calcium score   . Abnormal stress test 05/15/2021  . Pain in right wrist 05/09/2021  . De Quervain's tenosynovitis, right 04/13/2021  . 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  . Coronary artery disease of native artery of native heart with stable angina pectoris (HCC) 12/17/2020  . Statin intolerance 12/17/2020  . Essential hypertension 11/17/2020  . Mixed hyperlipidemia 11/17/2020   Past Medical History:  Diagnosis Date  . Allergy   . CVA (cerebral vascular accident) (HCC) 2018  . CVA (cerebral vascular accident) (HCC) 2017  . Diabetes (HCC)   . Exertional chest pain 11/17/2020  . Exertional dyspnea 11/17/2020  . GERD (gastroesophageal reflux disease) 2010  . Hyperlipidemia   . Hypertension   . Palpitations 11/17/2020  . Shingles     Family History  Problem Relation Age of Onset  . Hypertension  Mother   . Hyperlipidemia Mother   . Osteoporosis Mother   . Cancer Father   . Sarcoidosis Sister   . Diabetes Maternal Aunt     Past Surgical History:  Procedure Laterality Date  . CHOLECYSTECTOMY    . CHOLECYSTECTOMY  1990  . CORONARY STENT INTERVENTION N/A 05/16/2021   Procedure: CORONARY STENT INTERVENTION;  Surgeon: Elder Negus, MD;  Location: [facility];  Service: Cardiovascular;  Laterality: N/A;  . CYST REMOVAL HAND Right    wrist  . CYST REMOVAL LEG     left knee  . CYST REMOVAL LEG Left    knee  . HERNIA REPAIR    . HYSTEROTOMY    . LAPAROSCOPIC GASTRIC BANDING    . LAPAROSCOPIC  GASTRIC BANDING  2010  . LEFT HEART CATH AND CORONARY ANGIOGRAPHY N/A 05/16/2021   Procedure: LEFT HEART CATH AND CORONARY ANGIOGRAPHY;  Surgeon: Elder Negus, MD;  Location: [facility];  Service: Cardiovascular;  Laterality: N/A;  . ROTATOR CUFF REPAIR Left   . SHOULDER OPEN ROTATOR CUFF REPAIR    . TOTAL ABDOMINAL HYSTERECTOMY    . TUBAL LIGATION     Social History   Occupational History  . Not on file  Tobacco Use  . Smoking status: Never  . Smokeless tobacco: Never  Vaping Use  . Vaping status: Never Used  Substance and Sexual Activity  . Alcohol use: Not Currently    Alcohol/week: 1.0 standard drink of alcohol    Types: 1 Glasses of wine per week    Comment: occ wine  . Drug use: Never  . Sexual activity: Not Currently    Birth control/protection: Post-menopausal, None

## 2023-05-20 NOTE — Telephone Encounter (Signed)
Pharmacy Patient Advocate Encounter   Received notification from Fax that prior authorization for repatha is required/requested.   Insurance verification completed.   The patient is insured through [facility] .   Per test claim: PA required; PA submitted to above mentioned insurance via Fax Key/confirmation #[id number] faxed Status is pending

## 2023-05-20 NOTE — Progress Notes (Signed)
Office Visit Note   Patient: Veronica Lynch           Date of Birth: 04/28/1966           MRN: [id number] Visit Date: 05/20/2023              Requested by: [name], FNP [address] [place],  [place] [address] PCP: [name], FNP   Assessment & Plan: Visit Diagnoses:  1. Neck pain   2. Chronic right shoulder pain     Plan: She is given Flexeril 10 mg to take at night.  She is cautioned about driving while taking Flexeril.  Regards to her neck and radicular symptoms we will send her for physical therapy for range of motion home exercise program and modalities to the cervical spine.  Will see her back in just 4 weeks see how she is doing overall.  She is unable to take NSAIDs.  She reports that she did not tolerate Neurontin well in the past.  When asked she states that it caused her to gain weight and did not work.  She is unable to take NSAIDs due to her Plavix.  Follow-Up Instructions: Return in about 4 weeks (around 06/17/2023).   Orders:  Orders Placed This Encounter  Procedures   XR Cervical Spine 2 or 3 views   XR Shoulder Right   Meds ordered this encounter  Medications   cyclobenzaprine (FLEXERIL) 10 MG tablet    Sig: Take 1 tablet (10 mg total) by mouth 3 (three) times daily as needed for muscle spasms.    Dispense:  30 tablet    Refill:  0      Procedures: No procedures performed   Clinical Data: No additional findings.   Subjective: Chief Complaint  Patient presents with   Right Shoulder - Pain    HPI Patient is a 57 year old female comes in today with right shoulder pain it has been ongoing for the past 2 years but is coming worse.  She notes weakness in the right arm.  She notes numbness tingling coldness in the arm.  And unable to sleep.  Reports if she does sleep she sleeps on her right side but this awakens her.  She usually sleeps only after taking Tylenol PM.  She states even while taking the Tylenol PM the pain waking 0.   She is on chronic anticoagulation.  She also is diabetic hemoglobin A1c 13 days ago was 6.5.  She notes right shoulder blade pain but no neck pain.  She is right-hand dominant.  Notes that her right hand mostly goes numb in the fourth and fifth fingers.   Review of Systems See HPI otherwise negative  Objective: Vital Signs: There were no vitals taken for this visit.  Physical Exam General: Well-developed well-nourished female in no acute distress. Respirations: Unlabored Cervical spine: Negative Spurling's.  Tenderness along the medial border right scapula.  Good range of motion cervical spine otherwise without pain. Bilateral shoulders: 5 out of 5 strength with external rotation internal rotation against resistance.  Empty can test is negative bilaterally.  Positive impingement on the right negative on the left.  Liftoff is negative bilaterally. Upper extremities strength testing 5 out of 5 throughout except for triceps which is 4 out of 5 on the right.  Subjective decrease sensation right hand light touch throughout.  Otherwise full motor bilateral hands.  Radial pulses 2+ and equal symmetric bilaterally. Ortho Exam  Specialty Comments:  No specialty comments  available.  Imaging: XR Shoulder Right  Result Date: 05/20/2023 Right shoulder 3 views: Shoulder is well located.  No acute fracture.  Glenohumeral joint is well-maintained.  No significant arthritic changes.  XR Cervical Spine 2 or 3 views  Result Date: 05/20/2023 Cervical spine 2 views: No acute fractures.  Disc base overall well-maintained.  Anterior vertebral spurring at C3 and C5.  Otherwise no significant degenerative changes.    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) 01/30/2023   Encounter for dental examination 01/30/2023   Encounter for screening mammogram for breast cancer 01/30/2023   Class 1 obesity due to excess calories without serious comorbidity with body mass index (BMI) of 32.0 to 32.9 in adult 01/30/2023   Type 2 diabetes mellitus with hyperlipidemia (HCC) 01/30/2023   Elevated coronary artery calcium score    Abnormal stress test 05/15/2021   Pain in right wrist 05/09/2021   De Quervain's tenosynovitis, right 04/13/2021   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   Coronary artery disease of native artery of native heart with stable angina pectoris (HCC) 12/17/2020   Statin intolerance 12/17/2020   Essential hypertension 11/17/2020   Mixed hyperlipidemia 11/17/2020   Past Medical History:  Diagnosis Date   Allergy    CVA (cerebral vascular accident) (HCC) 2018   CVA (cerebral vascular accident) (HCC) 2017   Diabetes (HCC)    Exertional chest pain 11/17/2020   Exertional dyspnea 11/17/2020   GERD (gastroesophageal reflux disease) 2010   Hyperlipidemia    Hypertension    Palpitations 11/17/2020   Shingles     Family History  Problem Relation Age of Onset   Hypertension Mother    Hyperlipidemia Mother    Osteoporosis Mother    Cancer Father    Sarcoidosis Sister    Diabetes Maternal Aunt     Past Surgical History:  Procedure Laterality Date   CHOLECYSTECTOMY     CHOLECYSTECTOMY  1990   CORONARY STENT INTERVENTION N/A 05/16/2021   Procedure: CORONARY STENT INTERVENTION;  Surgeon: Elder Negus, MD;  Location: [facility];  Service: Cardiovascular;  Laterality: N/A;   CYST REMOVAL HAND Right    wrist   CYST REMOVAL LEG     left knee   CYST REMOVAL LEG Left    knee   HERNIA REPAIR     HYSTEROTOMY     LAPAROSCOPIC GASTRIC BANDING      LAPAROSCOPIC GASTRIC BANDING  2010   LEFT HEART CATH AND CORONARY ANGIOGRAPHY N/A 05/16/2021   Procedure: LEFT HEART CATH AND CORONARY ANGIOGRAPHY;  Surgeon: Elder Negus, MD;  Location: [facility];  Service: Cardiovascular;  Laterality: N/A;   ROTATOR CUFF REPAIR Left    SHOULDER OPEN ROTATOR CUFF REPAIR     TOTAL ABDOMINAL HYSTERECTOMY     TUBAL LIGATION     Social History   Occupational History   Not on file  Tobacco Use   Smoking status: Never   Smokeless tobacco: Never  Vaping Use   Vaping status: Never Used  Substance and Sexual Activity  Alcohol use: Not Currently    Alcohol/week: 1.0 standard drink of alcohol    Types: 1 Glasses of wine per week    Comment: occ wine   Drug use: Never   Sexual activity: Not Currently    Birth control/protection: Post-menopausal, None

## 2023-05-22 NOTE — Telephone Encounter (Signed)
Pharmacy Patient Advocate Encounter  Received notification from [facility] that Prior Authorization for repatha has been APPROVED from 05/20/23 to 05/19/24   PA #/Case ID/Reference #: [id number] DJ

## 2023-06-03 ENCOUNTER — Other Ambulatory Visit: Payer: Self-pay

## 2023-06-03 ENCOUNTER — Ambulatory Visit: Payer: BC Managed Care – PPO | Admitting: Physical Therapy

## 2023-06-03 ENCOUNTER — Encounter: Payer: Self-pay | Admitting: Physical Therapy

## 2023-06-03 DIAGNOSIS — M25511 Pain in right shoulder: Secondary | ICD-10-CM | POA: Diagnosis not present

## 2023-06-03 DIAGNOSIS — G8929 Other chronic pain: Secondary | ICD-10-CM | POA: Diagnosis not present

## 2023-06-03 DIAGNOSIS — M542 Cervicalgia: Secondary | ICD-10-CM | POA: Diagnosis not present

## 2023-06-03 DIAGNOSIS — M6281 Muscle weakness (generalized): Secondary | ICD-10-CM

## 2023-06-03 NOTE — Therapy (Signed)
OUTPATIENT PHYSICAL THERAPY CERVICAL EVALUATION   Patient Name: Veronica Lynch MRN: [id number] DOB:02-Aug-1965, 57 y.o., female Today's Date: 06/03/2023  END OF SESSION:  PT End of Session - 06/03/23 1604     Visit Number 1    Number of Visits 10    Date for PT Re-Evaluation 07/15/23    Authorization Type BCBS    PT Start Time 1515    PT Stop Time 1555    PT Time Calculation (min) 40 min    Activity Tolerance Patient tolerated treatment well    Behavior During Therapy [facility] for tasks assessed/performed             Past Medical History:  Diagnosis Date   Allergy    CVA (cerebral vascular accident) (HCC) 2018   CVA (cerebral vascular accident) (HCC) 2017   Diabetes (HCC)    Exertional chest pain 11/17/2020   Exertional dyspnea 11/17/2020   GERD (gastroesophageal reflux disease) 2010   Hyperlipidemia    Hypertension    Palpitations 11/17/2020   Shingles    Past Surgical History:  Procedure Laterality Date   CHOLECYSTECTOMY     CHOLECYSTECTOMY  1990   CORONARY STENT INTERVENTION N/A 05/16/2021   Procedure: CORONARY STENT INTERVENTION;  Surgeon: Elder Negus, MD;  Location: [facility];  Service: Cardiovascular;  Laterality: N/A;   CYST REMOVAL HAND Right    wrist   CYST REMOVAL LEG     left knee   CYST REMOVAL LEG Left    knee   HERNIA REPAIR     HYSTEROTOMY     LAPAROSCOPIC GASTRIC BANDING     LAPAROSCOPIC GASTRIC BANDING  2010   LEFT HEART CATH AND CORONARY ANGIOGRAPHY N/A 05/16/2021   Procedure: LEFT HEART CATH AND CORONARY ANGIOGRAPHY;  Surgeon: Elder Negus, MD;  Location: [facility];  Service: Cardiovascular;  Laterality: N/A;   ROTATOR CUFF REPAIR Left    SHOULDER OPEN ROTATOR CUFF REPAIR     TOTAL ABDOMINAL HYSTERECTOMY     TUBAL LIGATION     Patient Active Problem List   Diagnosis Date Noted   Encounter for annual health examination 05/13/2023   Vitamin D deficiency 05/13/2023   Acute pain of right shoulder  05/13/2023   Right arm numbness 05/13/2023   Class 1 obesity due to excess calories with serious comorbidity and body mass index (BMI) of 31.0 to 31.9 in adult 03/15/2023   Hypokalemia 03/01/2023   Weakness 03/01/2023   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) 01/30/2023   Encounter for dental examination 01/30/2023   Encounter for screening mammogram for breast cancer 01/30/2023   Class 1 obesity due to excess calories without serious comorbidity with body mass index (BMI) of 32.0 to 32.9 in adult 01/30/2023   Type 2 diabetes mellitus with hyperlipidemia (HCC) 01/30/2023   Elevated coronary artery calcium score    Abnormal stress test 05/15/2021   Pain in right wrist 05/09/2021   De Quervain's tenosynovitis, right 04/13/2021   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   Coronary artery disease of native artery of native heart with stable angina pectoris (HCC) 12/17/2020   Statin intolerance 12/17/2020   Essential hypertension 11/17/2020   Mixed hyperlipidemia 11/17/2020    PCP: [name], FNP   REFERRING PROVIDER: Kirtland Bouchard, PA-C   REFERRING DIAG: [phone] (ICD-10-CM) - Radiculopathy, cervical region  THERAPY DIAG:  Cervicalgia  Chronic right shoulder pain  Muscle weakness (generalized)  Rationale for Evaluation and Treatment:  Rehabilitation  ONSET DATE: 2 years onset of pain  SUBJECTIVE:                                                                                                                                                                                                         SUBJECTIVE STATEMENT: She report pain since in her neck and down her right arm to last 2 finger tips with N/T and weakness in her Rt arm. This has been going on for about 2 years but she had to take care of heart stuff first. She cannot lay on that sided.  Hand dominance: Right  PERTINENT HISTORY:  Previous Lt RTC repair, CVA, DM, CAD  PAIN:  NPRS scale: 2  currently but at worse is 10/10 upon arrival Pain location:Rt side neck and down Right lateral arm can go all the way to last 2 fingertips Pain description: intermittent dull ache, N/T Aggravating factors: reaching, using arm, handwritting, laying on Rt side Relieving factors: heat   PRECAUTIONS: None  RED FLAGS: None     WEIGHT BEARING RESTRICTIONS: No  FALLS:  Has patient fallen in last 6 months? 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  DIAGNOSTIC FINDINGS:  Cervical spine 2 views: No acute fractures.  Disc base overall  well-maintained.  Anterior vertebral spurring at C3 and C5.  Otherwise no  significant degenerative changes.   Right shoulder 3 views: Shoulder is well located.  No acute fracture.   Glenohumeral joint is well-maintained.  No significant arthritic changes.    PATIENT SURVEYS:  Eval FOTO 69% functional goal is 75%  COGNITION: Overall cognitive status: Within functional limits for tasks assessed  SENSATION: WFL  PALPATION: TTP in Rt upper trap and Rt cervical P.S, Rt deltoids   CERVICAL ROM:   Active ROM A/PROM (%) eval  Flexion 75%  Extension 50%  Right lateral flexion 50%  Left lateral flexion 75%  Right rotation 75%  Left rotation 75%   (Blank rows = not tested)  UPPER EXTREMITY ROM:  Active ROM Right eval Left eval  Shoulder flexion A:125 P:WFL   Shoulder extension    Shoulder abduction 80   Shoulder adduction    Shoulder extension    Shoulder internal rotation Left illiac crest   Shoulder external rotation WFL behind head   Elbow flexion    Elbow extension  Wrist flexion    Wrist extension    Wrist ulnar deviation    Wrist radial deviation    Wrist pronation    Wrist supination     (Blank  rows = not tested)  UPPER EXTREMITY MMT:  MMT Right eval Left eval  Shoulder flexion 3+   Shoulder extension    Shoulder abduction 3   Shoulder adduction    Shoulder extension    Shoulder internal rotation 3   Shoulder external rotation 3   Middle trapezius    Lower trapezius    Elbow flexion    Elbow extension    Wrist flexion    Wrist extension    Wrist ulnar deviation    Wrist radial deviation    Wrist pronation    Wrist supination    Grip strength     (Blank rows = not tested)  CERVICAL SPECIAL TESTS:  Spurling's test: Positive + Rt shoulder impingment testing Inconclusive drop arm test, can resist this some but not all the way and has increased pain.   TODAY'S TREATMENT:  Eval HEP creation and review with demonstration and trial set preformed, see below for details   PATIENT EDUCATION: Education details: HEP, PT plan of care Person educated: Patient Education method: Explanation, Demonstration, Verbal cues, and Handouts Education comprehension: verbalized understanding and needs further education   HOME EXERCISE PROGRAM: Access Code: [email or web address] URL: [email or web address] Date: 06/03/2023 Prepared by: Ivery Quale  Exercises - Seated Cervical Retraction  - 2 x daily - 6 x weekly - 1 sets - 10 reps - 5 sec hold - Seated Cervical Sidebending Stretch  - 2 x daily - 6 x weekly - 3 sets - 30 sec hold - Doorway Pec Stretch at 90 Degrees Abduction  - 2 x daily - 6 x weekly - 5 sets - 10 sec hold - Standing Shoulder Row with Anchored Resistance  - 2 x daily - 6 x weekly - 2 sets - 10-15 reps - Shoulder Extension with Resistance - Neutral  - 2 x daily - 6 x weekly - 2 sets - 10-15 reps - Shoulder External Rotation with Anchored Resistance  - 2 x daily - 6 x weekly - 2 sets - 10 reps - Supine Shoulder Flexion AAROM with Hands Clasped  - 2 x daily - 6 x weekly - 1-2 sets - 10 reps - 5 sec hold  ASSESSMENT:  CLINICAL IMPRESSION: Patient referred to PT  for Radiculopathy, cervical region in ulnar distribution today. She also has signs of Rt shoulder impingement and or RTC tendinopathy. RTC tear can not be ruled out today. Patient will benefit from skilled PT to address below impairments, limitations and improve overall function. If not improvements in 4 weeks would recommend follow back up with MD for further imaging/diagnostic testing.  OBJECTIVE IMPAIRMENTS: decreased activity tolerance, decreased shoulder mobility, decreased ROM, decreased strength, impaired flexibility, impaired UE use, postural dysfunction, and pain.  ACTIVITY LIMITATIONS: reaching, lifting, carry,  cleaning, driving, and or occupation  PERSONAL FACTORS:  also affecting patient's functional outcome.  REHAB POTENTIAL: Good  CLINICAL DECISION MAKING: Stable/uncomplicated  EVALUATION COMPLEXITY: Low    GOALS: Short term PT Goals Target date: 07/01/2023   Pt will be I and compliant with HEP. Baseline:  Goal status: New  Long term PT goals Target date:07/15/2023   Pt will improve Rt shoulder AROM to [facility] to improve functional reaching Baseline: Goal status: New Pt will improve  Rt shoulder strength to at least 4+/5 MMT to improve  functional strength Baseline: Goal status: New Pt will improve FOTO to at least 75% functional to show improved function Baseline: Goal status: New Pt will reduce pain to overall less than 3/10 with usual activity and work activity. Baseline: Goal status: New  PLAN: PT FREQUENCY: 1-2 times per week   PT DURATION: 6 weeks  PLANNED INTERVENTIONS (unless contraindicated): aquatic PT, Canalith repositioning, cryotherapy, Electrical stimulation, Iontophoresis with 4 mg/ml dexamethasome, Moist heat, traction, Ultrasound, gait training, Therapeutic exercise, balance training, neuromuscular re-education, patient/family education, , manual techniques, passive ROM, dry needling, taping, vasopnuematic device, vestibular, spinal  manipulations, joint manipulations 97110-Therapeutic exercises, 97530- Therapeutic activity, 97112- Neuromuscular re-education, 97535- Self Care, and 78295- Manual therapy  PLAN FOR NEXT SESSION: review HEP, consider traction, DN, other modalities.    April Manson, PT,DPT 06/03/2023, 4:05 PM

## 2023-06-06 ENCOUNTER — Encounter: Payer: Self-pay | Admitting: Nurse Practitioner

## 2023-06-06 ENCOUNTER — Encounter: Payer: Self-pay | Admitting: Family Medicine

## 2023-06-06 ENCOUNTER — Ambulatory Visit: Payer: BC Managed Care – PPO | Admitting: Family Medicine

## 2023-06-06 VITALS — BP 110/80 | HR 90 | Temp 97.9°F | Ht 66.0 in | Wt 199.8 lb

## 2023-06-06 DIAGNOSIS — H9202 Otalgia, left ear: Secondary | ICD-10-CM

## 2023-06-06 DIAGNOSIS — E66811 Obesity, class 1: Secondary | ICD-10-CM | POA: Diagnosis not present

## 2023-06-06 DIAGNOSIS — R1310 Dysphagia, unspecified: Secondary | ICD-10-CM

## 2023-06-06 DIAGNOSIS — R221 Localized swelling, mass and lump, neck: Secondary | ICD-10-CM | POA: Diagnosis not present

## 2023-06-06 DIAGNOSIS — E6609 Other obesity due to excess calories: Secondary | ICD-10-CM

## 2023-06-06 DIAGNOSIS — Z6832 Body mass index (BMI) 32.0-32.9, adult: Secondary | ICD-10-CM

## 2023-06-06 DIAGNOSIS — R09A2 Foreign body sensation, throat: Secondary | ICD-10-CM

## 2023-06-06 MED ORDER — GUAIFENESIN ER 600 MG PO TB12: 600.0000 mg | ORAL_TABLET | Freq: Two times a day (BID) | ORAL | 0 refills | Status: AC

## 2023-06-06 MED ORDER — ACETIC ACID 2 % OT SOLN: 3.0000 [drp] | Freq: Three times a day (TID) | OTIC | 0 refills | Status: AC

## 2023-06-06 NOTE — Progress Notes (Signed)
I,Jameka J Llittleton, CMA,acting as a Neurosurgeon for [employer], NP.,have documented all relevant documentation on the behalf of Ellender Hose, NP,as directed by  Ellender Hose, NP while in the presence of Ellender Hose, NP.  Subjective:  Patient ID: Veronica Lynch , female    DOB: 02-19-1966 , 57 y.o.   MRN: [id number]  Chief Complaint  Patient presents with   Mass    HPI  Patient is a 57 year old female who presents today for discomfort  in her throat and pain in her ear. She reports she woke up this morning with her mouth feeling dry and also she states that she has a sensation of feeling that there is something in her throat. She states she had difficulty when she swallowed tablets this morning and still feels like something is in her throat.      Past Medical History:  Diagnosis Date   Allergy    CVA (cerebral vascular accident) (HCC) 2018   CVA (cerebral vascular accident) (HCC) 2017   Diabetes (HCC)    Exertional chest pain 11/17/2020   Exertional dyspnea 11/17/2020   GERD (gastroesophageal reflux disease) 2010   Hyperlipidemia    Hypertension    Palpitations 11/17/2020   Shingles      Family History  Problem Relation Age of Onset   Hypertension Mother    Hyperlipidemia Mother    Osteoporosis Mother    Cancer Father    Sarcoidosis Sister    Diabetes Maternal Aunt      Current Outpatient Medications:    Ascorbic Acid (VITAMIN C) 1000 MG tablet, Take 1,000 mg by mouth daily., Disp: , Rfl:    Biotin 10 MG TABS, Take 10 mg by mouth daily., Disp: , Rfl:    celecoxib (CELEBREX) 200 MG capsule, TAKE 1 CAPSULE BY MOUTH EVERY DAY, Disp: 30 capsule, Rfl: 2   clopidogrel (PLAVIX) 75 MG tablet, Take 1 tablet (75 mg total) by mouth daily., Disp: 90 tablet, Rfl: 3   cyclobenzaprine (FLEXERIL) 10 MG tablet, Take 1 tablet (10 mg total) by mouth 3 (three) times daily as needed for muscle spasms., Disp: 30 tablet, Rfl: 0   Evolocumab (REPATHA SURECLICK) 140 MG/ML SOAJ, Inject 140 mg into  the skin every 14 (fourteen) days., Disp: 6 mL, Rfl: 5   furosemide (LASIX) 20 MG tablet, Take 1 tablet (20 mg total) by mouth daily. As needed for leg swelling may repeat x 1 if not improved, Disp: 180 tablet, Rfl: 1   metoprolol succinate (TOPROL-XL) 50 MG 24 hr tablet, Take 1 tablet (50 mg total) by mouth daily. Take with or immediately following a meal., Disp: 90 tablet, Rfl: 3   nitroGLYCERIN (NITROSTAT) 0.4 MG SL tablet, Place 1 tablet (0.4 mg total) under the tongue every 5 (five) minutes as needed for chest pain., Disp: 30 tablet, Rfl: 2   ondansetron (ZOFRAN) 4 MG tablet, TAKE 1 TABLET BY MOUTH DAILY AS NEEDED FOR NAUSEA OR VOMITING., Disp: 18 tablet, Rfl: 1   Semaglutide,0.25 or 0.5MG /DOS, 2 MG/3ML SOPN, Inject 0.5 mg into the skin once a week., Disp: 9 mL, Rfl: 1   TRUE METRIX BLOOD GLUCOSE TEST test strip, 1 each by Other route daily. Check blood sugar once daily, Disp: 100 each, Rfl: 3   empagliflozin (JARDIANCE) 25 MG TABS tablet, Take 1 tablet (25 mg total) by mouth daily before breakfast. (Patient not taking: Reported on 06/06/2023), Disp: 90 tablet, Rfl: 1   Allergies  Allergen Reactions   Bactrim [Sulfamethoxazole-Trimethoprim] Hives  and Itching   Codeine Other (See Comments)    Pass out      Review of Systems  Constitutional: Negative.   HENT:  Positive for ear pain and sore throat.   Eyes: Negative.   Respiratory: Negative.  Negative for shortness of breath.   Cardiovascular: Negative.   Gastrointestinal: Negative.   Musculoskeletal: Negative.   Skin: Negative.   Psychiatric/Behavioral: Negative.       Today's Vitals   06/06/23 1441  BP: 110/80  Pulse: 90  Temp: 97.9 F (36.6 C)  Weight: 199 lb 12.8 oz (90.6 kg)  Height: 5\' 6"  (1.676 m)  PainSc: 1   PainLoc: Throat   Body mass index is 32.25 kg/m.  Wt Readings from Last 3 Encounters:  06/06/23 199 lb 12.8 oz (90.6 kg)  05/07/23 191 lb 9.6 oz (86.9 kg)  04/18/23 194 lb (88 kg)    The ASCVD Risk score  (Arnett DK, et al., 2019) failed to calculate for the following reasons:   The patient has a prior MI or stroke diagnosis  Objective:  Physical Exam HENT:     Head: Normocephalic.     Right Ear: Hearing normal. No decreased hearing noted. No tenderness. There is no impacted cerumen.     Left Ear: Hearing normal. No decreased hearing noted. Tenderness present. There is no impacted cerumen.     Nose: No congestion or rhinorrhea.     Mouth/Throat:     Pharynx: Oropharynx is clear. No pharyngeal swelling, posterior oropharyngeal erythema or uvula swelling.     Tonsils: No tonsillar exudate or tonsillar abscesses.  Cardiovascular:     Rate and Rhythm: Normal rate.  Pulmonary:     Effort: Pulmonary effort is normal.  Musculoskeletal:     Cervical back: Normal range of motion. No tenderness.  Skin:    General: Skin is warm and dry.  Neurological:     Mental Status: She is alert.         Assessment And Plan:  Otalgia of left ear -     Acetic Acid; Place 3 drops into the left ear 3 (three) times daily for 7 days.  Dispense: 3.2 mL; Refill: 0  Lump in throat -     guaiFENesin ER; Take 1 tablet (600 mg total) by mouth 2 (two) times daily for 5 days.  Dispense: 10 tablet; Refill: 0 -     US SOFT TISSUE HEAD & NECK (NON-THYROID); Future  Class 1 obesity due to excess calories without serious comorbidity with body mass index (BMI) of 32.0 to 32.9 in adult Assessment & Plan: She is encouraged to strive for BMI less than 30 to decrease cardiac risk. Advised to aim for at least 150 minutes of exercise per week.      Return if symptoms worsen or fail to improve, for Keep Appt.  Patient was given opportunity to ask questions. Patient verbalized understanding of the plan and was able to repeat key elements of the plan. All questions were answered to their satisfaction.   I, Ellender Hose, NP, have reviewed all documentation for this visit. The documentation on 06/13/23 for the exam, diagnosis,  procedures, and orders are all accurate and complete.     IF YOU HAVE BEEN REFERRED TO A SPECIALIST, IT MAY TAKE 1-2 WEEKS TO SCHEDULE/PROCESS THE REFERRAL. IF YOU HAVE NOT HEARD FROM US/SPECIALIST IN TWO WEEKS, PLEASE GIVE [place] A CALL AT [phone].

## 2023-06-15 DIAGNOSIS — H9202 Otalgia, left ear: Secondary | ICD-10-CM | POA: Insufficient documentation

## 2023-06-15 DIAGNOSIS — R221 Localized swelling, mass and lump, neck: Secondary | ICD-10-CM | POA: Insufficient documentation

## 2023-06-15 NOTE — Assessment & Plan Note (Signed)
 She is encouraged to strive for BMI less than 30 to decrease cardiac risk. Advised to aim for at least 150 minutes of exercise per week.

## 2023-06-18 ENCOUNTER — Other Ambulatory Visit: Payer: BC Managed Care – PPO

## 2023-06-18 ENCOUNTER — Ambulatory Visit: Payer: BC Managed Care – PPO | Admitting: Physical Therapy

## 2023-06-18 ENCOUNTER — Encounter: Payer: Self-pay | Admitting: Physical Therapy

## 2023-06-18 DIAGNOSIS — M6281 Muscle weakness (generalized): Secondary | ICD-10-CM | POA: Diagnosis not present

## 2023-06-18 DIAGNOSIS — G8929 Other chronic pain: Secondary | ICD-10-CM

## 2023-06-18 DIAGNOSIS — M25511 Pain in right shoulder: Secondary | ICD-10-CM | POA: Diagnosis not present

## 2023-06-18 DIAGNOSIS — M542 Cervicalgia: Secondary | ICD-10-CM

## 2023-06-18 NOTE — Therapy (Signed)
OUTPATIENT PHYSICAL THERAPY CERVICAL EVALUATION   Patient Name: Veronica Lynch MRN: [id number] DOB:07/04/1966, 57 y.o., female Today's Date: 06/18/2023  END OF SESSION:  PT End of Session - 06/18/23 1451     Visit Number 2    Number of Visits 10    Date for PT Re-Evaluation 07/15/23    Authorization Type BCBS    PT Start Time 1450    PT Stop Time 1530    PT Time Calculation (min) 40 min    Activity Tolerance Patient tolerated treatment well    Behavior During Therapy [facility] for tasks assessed/performed              Past Medical History:  Diagnosis Date   Allergy    CVA (cerebral vascular accident) (HCC) 2018   CVA (cerebral vascular accident) (HCC) 2017   Diabetes (HCC)    Exertional chest pain 11/17/2020   Exertional dyspnea 11/17/2020   GERD (gastroesophageal reflux disease) 2010   Hyperlipidemia    Hypertension    Palpitations 11/17/2020   Shingles    Past Surgical History:  Procedure Laterality Date   CHOLECYSTECTOMY     CHOLECYSTECTOMY  1990   CORONARY STENT INTERVENTION N/A 05/16/2021   Procedure: CORONARY STENT INTERVENTION;  Surgeon: Elder Negus, MD;  Location: [facility];  Service: Cardiovascular;  Laterality: N/A;   CYST REMOVAL HAND Right    wrist   CYST REMOVAL LEG     left knee   CYST REMOVAL LEG Left    knee   HERNIA REPAIR     HYSTEROTOMY     LAPAROSCOPIC GASTRIC BANDING     LAPAROSCOPIC GASTRIC BANDING  2010   LEFT HEART CATH AND CORONARY ANGIOGRAPHY N/A 05/16/2021   Procedure: LEFT HEART CATH AND CORONARY ANGIOGRAPHY;  Surgeon: Elder Negus, MD;  Location: [facility];  Service: Cardiovascular;  Laterality: N/A;   ROTATOR CUFF REPAIR Left    SHOULDER OPEN ROTATOR CUFF REPAIR     TOTAL ABDOMINAL HYSTERECTOMY     TUBAL LIGATION     Patient Active Problem List   Diagnosis Date Noted   Lump in throat 06/15/2023   Otalgia of left ear 06/15/2023   Encounter for annual health examination 05/13/2023   Vitamin  D deficiency 05/13/2023   Acute pain of right shoulder 05/13/2023   Right arm numbness 05/13/2023   Class 1 obesity due to excess calories with serious comorbidity and body mass index (BMI) of 31.0 to 31.9 in adult 03/15/2023   Hypokalemia 03/01/2023   Weakness 03/01/2023   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) 01/30/2023   Encounter for dental examination 01/30/2023   Encounter for screening mammogram for breast cancer 01/30/2023   Class 1 obesity due to excess calories without serious comorbidity with body mass index (BMI) of 32.0 to 32.9 in adult 01/30/2023   Type 2 diabetes mellitus with hyperlipidemia (HCC) 01/30/2023   Elevated coronary artery calcium score    Abnormal stress test 05/15/2021   Pain in right wrist 05/09/2021   De Quervain's tenosynovitis, right 04/13/2021   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   Coronary artery disease of native artery of native heart with stable angina pectoris (HCC) 12/17/2020   Statin intolerance 12/17/2020   Essential hypertension 11/17/2020   Mixed hyperlipidemia 11/17/2020    PCP: [name], FNP   REFERRING PROVIDER: Kirtland Bouchard, PA-C   REFERRING DIAG: [phone] (ICD-10-CM) - Radiculopathy, cervical region  THERAPY DIAG:  Cervicalgia  Chronic right shoulder pain  Muscle weakness (generalized)  Rationale for Evaluation and Treatment: Rehabilitation  ONSET DATE: 2 years onset of pain  SUBJECTIVE:                                                                                                                                                                                                         SUBJECTIVE STATEMENT: She reports the pain is better, not having the shooting pain down her arm or the N/T. Still has limited ROM. Hand dominance: Right  PERTINENT HISTORY:  Previous Lt RTC repair, CVA, DM, CAD  PAIN:  NPRS scale: 0 currently upon arrival but still with some pain with activity Pain  location:Rt side neck and down Right lateral arm can go all the way to last 2 fingertips Pain description: intermittent dull ache, N/T Aggravating factors: reaching, using arm, handwritting, laying on Rt side Relieving factors: heat   PRECAUTIONS: None  RED FLAGS: None     WEIGHT BEARING RESTRICTIONS: No  FALLS:  Has patient fallen in last 6 months? 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  DIAGNOSTIC FINDINGS:  Cervical spine 2 views: No acute fractures.  Disc base overall  well-maintained.  Anterior vertebral spurring at C3 and C5.  Otherwise no  significant degenerative changes.   Right shoulder 3 views: Shoulder is well located.  No acute fracture.   Glenohumeral joint is well-maintained.  No significant arthritic changes.    PATIENT SURVEYS:  Eval FOTO 69% functional goal is 75%  COGNITION: Overall cognitive status: Within functional limits for tasks assessed  SENSATION: WFL  PALPATION: TTP in Rt upper trap and Rt cervical P.S, Rt deltoids   CERVICAL ROM:   Active ROM A/PROM (%) eval  Flexion 75%  Extension 50%  Right lateral flexion 50%  Left lateral flexion 75%  Right rotation 75%  Left rotation 75%   (Blank rows = not tested)  UPPER EXTREMITY ROM:  Active ROM Right eval Left eval  Shoulder flexion A:125 P:WFL   Shoulder extension    Shoulder abduction 80   Shoulder adduction    Shoulder extension    Shoulder internal rotation Left illiac crest   Shoulder external rotation WFL behind head   Elbow flexion    Elbow extension    Wrist flexion    Wrist extension    Wrist  ulnar deviation    Wrist radial deviation    Wrist pronation    Wrist supination     (Blank rows = not tested)  UPPER EXTREMITY MMT:  MMT  Right eval Left eval  Shoulder flexion 3+   Shoulder extension    Shoulder abduction 3   Shoulder adduction    Shoulder extension    Shoulder internal rotation 3   Shoulder external rotation 3   Middle trapezius    Lower trapezius    Elbow flexion    Elbow extension    Wrist flexion    Wrist extension    Wrist ulnar deviation    Wrist radial deviation    Wrist pronation    Wrist supination    Grip strength     (Blank rows = not tested)  CERVICAL SPECIAL TESTS:  Spurling's test: Positive + Rt shoulder impingment testing Inconclusive drop arm test, can resist this some but not all the way and has increased pain.   TODAY'S TREATMENT:  06/18/23 UBE L2 6 min, switch halfway Rows green 2X10 Extensions red 2X10 Shoulder ER red 2X10 Shoulder IR red 2X10 Cervical rotation AAROM with towel 5 sec X 10 bilat Cervical extension SNAG with towel 5 sec X 10 Upper trap stretch 30 sec X 2 bilat Cervical retractions X 10 Wall ladder X 10 into flexion, 5 sec hold Doorway stretch 20 sec X 3  Eval HEP creation and review with demonstration and trial set preformed, see below for details   PATIENT EDUCATION: Education details: HEP, PT plan of care Person educated: Patient Education method: Explanation, Demonstration, Verbal cues, and Handouts Education comprehension: verbalized understanding and needs further education   HOME EXERCISE PROGRAM: Access Code: [email or web address] URL: [email or web address] Date: 06/03/2023 Prepared by: Ivery Quale  Exercises - Seated Cervical Retraction  - 2 x daily - 6 x weekly - 1 sets - 10 reps - 5 sec hold - Seated Cervical Sidebending Stretch  - 2 x daily - 6 x weekly - 3 sets - 30 sec hold - Doorway Pec Stretch at 90 Degrees Abduction  - 2 x daily - 6 x weekly - 5 sets - 10 sec hold - Standing Shoulder Row with Anchored Resistance  - 2 x daily - 6 x weekly - 2 sets - 10-15 reps - Shoulder Extension with Resistance - Neutral  - 2 x daily  - 6 x weekly - 2 sets - 10-15 reps - Shoulder External Rotation with Anchored Resistance  - 2 x daily - 6 x weekly - 2 sets - 10 reps - Supine Shoulder Flexion AAROM with Hands Clasped  - 2 x daily - 6 x weekly - 1-2 sets - 10 reps - 5 sec hold  ASSESSMENT:  CLINICAL IMPRESSION: She expresses some early relief in symptoms since starting PT and was not having any N/T that she had at eval. She still has some limited ROM due to pain with reaching over 90 deg that we will continue to work to improve with PT.   OBJECTIVE IMPAIRMENTS: decreased activity tolerance, decreased shoulder mobility, decreased ROM, decreased strength, impaired flexibility, impaired UE use, postural dysfunction, and pain.  ACTIVITY LIMITATIONS: reaching, lifting, carry,  cleaning, driving, and or occupation  PERSONAL FACTORS:  also affecting patient's functional outcome.  REHAB POTENTIAL: Good  CLINICAL DECISION MAKING: Stable/uncomplicated  EVALUATION COMPLEXITY: Low    GOALS: Short term PT Goals Target date: 07/01/2023   Pt will be I and compliant with HEP. Baseline:  Goal status: New  Long term PT goals Target date:07/15/2023   Pt will improve Rt shoulder AROM to [facility] to improve functional reaching Baseline: Goal status: New Pt will improve  Rt shoulder strength to at least 4+/5 MMT to improve functional strength Baseline: Goal status: New Pt will improve FOTO to at least 75% functional to show improved function Baseline: Goal status: New Pt will reduce pain to overall less than 3/10 with usual activity and work activity. Baseline: Goal status: New  PLAN: PT FREQUENCY: 1-2 times per week   PT DURATION: 6 weeks  PLANNED INTERVENTIONS (unless contraindicated): aquatic PT, Canalith repositioning, cryotherapy, Electrical stimulation, Iontophoresis with 4 mg/ml dexamethasome, Moist heat, traction, Ultrasound, gait training, Therapeutic exercise, balance training, neuromuscular re-education,  patient/family education, , manual techniques, passive ROM, dry needling, taping, vasopnuematic device, vestibular, spinal manipulations, joint manipulations 97110-Therapeutic exercises, 97530- Therapeutic activity, 97112- Neuromuscular re-education, 97535- Self Care, and 84696- Manual therapy  PLAN FOR NEXT SESSION: review HEP, consider traction, DN, other modalities PRN (these were held last visit as was not having any N/T).    April Manson, PT,DPT 06/18/2023, 2:51 PM

## 2023-06-26 ENCOUNTER — Encounter: Payer: Self-pay | Admitting: Physical Therapy

## 2023-06-26 ENCOUNTER — Ambulatory Visit: Payer: BC Managed Care – PPO | Admitting: Physical Therapy

## 2023-06-26 DIAGNOSIS — M6281 Muscle weakness (generalized): Secondary | ICD-10-CM | POA: Diagnosis not present

## 2023-06-26 DIAGNOSIS — M25511 Pain in right shoulder: Secondary | ICD-10-CM | POA: Diagnosis not present

## 2023-06-26 DIAGNOSIS — M542 Cervicalgia: Secondary | ICD-10-CM | POA: Diagnosis not present

## 2023-06-26 DIAGNOSIS — G8929 Other chronic pain: Secondary | ICD-10-CM | POA: Diagnosis not present

## 2023-06-26 NOTE — Therapy (Signed)
OUTPATIENT PHYSICAL THERAPY CERVICAL EVALUATION   Patient Name: SYMANTHA POLCZYNSKI MRN: [id number] DOB:1965/08/22, 57 y.o., female Today's Date: 06/26/2023  END OF SESSION:  PT End of Session - 06/26/23 1441     Visit Number 3    Number of Visits 10    Date for PT Re-Evaluation 07/15/23    Authorization Type BCBS    PT Start Time 1420    PT Stop Time 1500    PT Time Calculation (min) 40 min    Activity Tolerance Patient tolerated treatment well    Behavior During Therapy [facility] for tasks assessed/performed              Past Medical History:  Diagnosis Date   Allergy    CVA (cerebral vascular accident) (HCC) 2018   CVA (cerebral vascular accident) (HCC) 2017   Diabetes (HCC)    Exertional chest pain 11/17/2020   Exertional dyspnea 11/17/2020   GERD (gastroesophageal reflux disease) 2010   Hyperlipidemia    Hypertension    Palpitations 11/17/2020   Shingles    Past Surgical History:  Procedure Laterality Date   CHOLECYSTECTOMY     CHOLECYSTECTOMY  1990   CORONARY STENT INTERVENTION N/A 05/16/2021   Procedure: CORONARY STENT INTERVENTION;  Surgeon: Elder Negus, MD;  Location: [facility];  Service: Cardiovascular;  Laterality: N/A;   CYST REMOVAL HAND Right    wrist   CYST REMOVAL LEG     left knee   CYST REMOVAL LEG Left    knee   HERNIA REPAIR     HYSTEROTOMY     LAPAROSCOPIC GASTRIC BANDING     LAPAROSCOPIC GASTRIC BANDING  2010   LEFT HEART CATH AND CORONARY ANGIOGRAPHY N/A 05/16/2021   Procedure: LEFT HEART CATH AND CORONARY ANGIOGRAPHY;  Surgeon: Elder Negus, MD;  Location: [facility];  Service: Cardiovascular;  Laterality: N/A;   ROTATOR CUFF REPAIR Left    SHOULDER OPEN ROTATOR CUFF REPAIR     TOTAL ABDOMINAL HYSTERECTOMY     TUBAL LIGATION     Patient Active Problem List   Diagnosis Date Noted   Lump in throat 06/15/2023   Otalgia of left ear 06/15/2023   Encounter for annual health examination 05/13/2023   Vitamin  D deficiency 05/13/2023   Acute pain of right shoulder 05/13/2023   Right arm numbness 05/13/2023   Class 1 obesity due to excess calories with serious comorbidity and body mass index (BMI) of 31.0 to 31.9 in adult 03/15/2023   Hypokalemia 03/01/2023   Weakness 03/01/2023   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) 01/30/2023   Encounter for dental examination 01/30/2023   Encounter for screening mammogram for breast cancer 01/30/2023   Class 1 obesity due to excess calories without serious comorbidity with body mass index (BMI) of 32.0 to 32.9 in adult 01/30/2023   Type 2 diabetes mellitus with hyperlipidemia (HCC) 01/30/2023   Elevated coronary artery calcium score    Abnormal stress test 05/15/2021   Pain in right wrist 05/09/2021   De Quervain's tenosynovitis, right 04/13/2021   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   Coronary artery disease of native artery of native heart with stable angina pectoris (HCC) 12/17/2020   Statin intolerance 12/17/2020   Essential hypertension 11/17/2020   Mixed hyperlipidemia 11/17/2020    PCP: [name], FNP   REFERRING PROVIDER: Kirtland Bouchard, PA-C   REFERRING DIAG: [id number] (ICD-10-CM) - Radiculopathy, cervical region  THERAPY DIAG:  Cervicalgia  Chronic right shoulder pain  Muscle weakness (generalized)  Rationale for Evaluation and Treatment: Rehabilitation  ONSET DATE: 2 years onset of pain  SUBJECTIVE:                                                                                                                                                                                                         SUBJECTIVE STATEMENT: She reports no longer having numbness and tingling in her Rt arm however she does still get pain that runs down from her Rt scapula down her lateral arm to bicep. She still gets pain with reaching and has difficulty getting her arm behind her back. Hand dominance: Right  PERTINENT HISTORY:   Previous Lt RTC repair, CVA, DM, CAD  PAIN:  NPRS scale: 8 with activity Pain location:Rt side neck and down Right lateral arm can go all the way to last 2 fingertips Pain description: intermittent dull ache, N/T Aggravating factors: reaching, using arm, handwritting, laying on Rt side Relieving factors: heat   PRECAUTIONS: None  RED FLAGS: None     WEIGHT BEARING RESTRICTIONS: No  FALLS:  Has patient fallen in last 6 months? 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  DIAGNOSTIC FINDINGS:  Cervical spine 2 views: No acute fractures.  Disc base overall  well-maintained.  Anterior vertebral spurring at C3 and C5.  Otherwise no  significant degenerative changes.   Right shoulder 3 views: Shoulder is well located.  No acute fracture.   Glenohumeral joint is well-maintained.  No significant arthritic changes.    PATIENT SURVEYS:  Eval FOTO 69% functional goal is 75%  COGNITION: Overall cognitive status: Within functional limits for tasks assessed  SENSATION: WFL  PALPATION: TTP in Rt upper trap and Rt cervical P.S, Rt deltoids   CERVICAL ROM:   Active ROM A/PROM (%) eval  Flexion 75%  Extension 50%  Right lateral flexion 50%  Left lateral flexion 75%  Right rotation 75%  Left rotation 75%   (Blank rows = not tested)  UPPER EXTREMITY ROM:  Active ROM Right eval Left eval  Shoulder flexion A:125 P:WFL   Shoulder extension    Shoulder abduction 80   Shoulder adduction    Shoulder extension    Shoulder internal rotation Left illiac crest   Shoulder external rotation WFL behind head   Elbow flexion  Elbow extension    Wrist flexion    Wrist extension    Wrist ulnar deviation    Wrist radial deviation    Wrist pronation     Wrist supination     (Blank rows = not tested)  UPPER EXTREMITY MMT:  MMT Right eval Left eval  Shoulder flexion 3+   Shoulder extension    Shoulder abduction 3   Shoulder adduction    Shoulder extension    Shoulder internal rotation 3   Shoulder external rotation 3   Middle trapezius    Lower trapezius    Elbow flexion    Elbow extension    Wrist flexion    Wrist extension    Wrist ulnar deviation    Wrist radial deviation    Wrist pronation    Wrist supination    Grip strength     (Blank rows = not tested)  CERVICAL SPECIAL TESTS:  Spurling's test: Positive + Rt shoulder impingment testing Inconclusive drop arm test, can resist this some but not all the way and has increased pain.   TODAY'S TREATMENT:  06/26/23 UBE L2 6 min, switch halfway Manual therapy performed by Ivery Quale, PT,DPT for active compression and skilled palpation and Trigger Point Dry-Needling  Treatment instructions: Expect mild to moderate muscle soreness. Patient Consent Given: Yes Education handout provided: verbally provided Muscles treated: Right infraspinatus, deltoids, RTC insertion, bicep Estim combined: No Treatment response/outcome: good overall tolerance,twitch response noted  Moist heat X 7 min not included in billed time IR stretch with strap behind back 5 sec X 10 across and 5 sec X 10 up Shoulder isometrics at door frame IR and ER 5 sec X 10 each Shoulder flexion AAROM 1# bar X 10    06/18/23 UBE L2 6 min, switch halfway Rows green 2X10 Extensions red 2X10 Shoulder ER red 2X10 Shoulder IR red 2X10 Cervical rotation AAROM with towel 5 sec X 10 bilat Cervical extension SNAG with towel 5 sec X 10 Upper trap stretch 30 sec X 2 bilat Cervical retractions X 10 Wall ladder X 10 into flexion, 5 sec hold Doorway stretch 20 sec X 3  Eval HEP creation and review with demonstration and trial set preformed, see below for details   PATIENT EDUCATION: Education details: HEP,  PT plan of care Person educated: Patient Education method: Explanation, Demonstration, Verbal cues, and Handouts Education comprehension: verbalized understanding and needs further education   HOME EXERCISE PROGRAM: Access Code: [email or web address] URL: [email or web address] Date: 06/03/2023 Prepared by: Ivery Quale  Exercises - Seated Cervical Retraction  - 2 x daily - 6 x weekly - 1 sets - 10 reps - 5 sec hold - Seated Cervical Sidebending Stretch  - 2 x daily - 6 x weekly - 3 sets - 30 sec hold - Doorway Pec Stretch at 90 Degrees Abduction  - 2 x daily - 6 x weekly - 5 sets - 10 sec hold - Standing Shoulder Row with Anchored Resistance  - 2 x daily - 6 x weekly - 2 sets - 10-15 reps - Shoulder Extension with Resistance - Neutral  - 2 x daily - 6 x weekly - 2 sets - 10-15 reps - Shoulder External Rotation with Anchored Resistance  - 2 x daily - 6 x weekly - 2 sets - 10 reps - Supine Shoulder Flexion AAROM with Hands Clasped  - 2 x daily - 6 x weekly - 1-2 sets - 10 reps - 5 sec hold  ASSESSMENT:  CLINICAL IMPRESSION: She  continues to have Rt shoudler pain with activity that is limiting use of her Rt UE. I did try DN today to see if this will help reduce any of these symptoms and improve exercise tolerance with it.   OBJECTIVE IMPAIRMENTS: decreased activity tolerance, decreased shoulder mobility, decreased ROM, decreased strength, impaired flexibility, impaired UE use, postural dysfunction, and pain.  ACTIVITY LIMITATIONS: reaching, lifting, carry,  cleaning, driving, and or occupation  PERSONAL FACTORS:  also affecting patient's functional outcome.  REHAB POTENTIAL: Good  CLINICAL DECISION MAKING: Stable/uncomplicated  EVALUATION COMPLEXITY: Low    GOALS: Short term PT Goals Target date: 07/01/2023   Pt will be I and compliant with HEP. Baseline:  Goal status: MET 06/26/23  Long term PT goals Target date:07/15/2023   Pt will improve Rt shoulder AROM to [facility] to  improve functional reaching Baseline: Goal status: ongoing 06/26/23 Pt will improve  Rt shoulder strength to at least 4+/5 MMT to improve functional strength Baseline: Goal status: ongoing 06/26/23 Pt will improve FOTO to at least 75% functional to show improved function Baseline: Goal status: ongoing 06/26/23 Pt will reduce pain to overall less than 3/10 with usual activity and work activity. Baseline: Goal status: ongoing 06/26/23  PLAN: PT FREQUENCY: 1-2 times per week   PT DURATION: 6 weeks  PLANNED INTERVENTIONS (unless contraindicated): aquatic PT, Canalith repositioning, cryotherapy, Electrical stimulation, Iontophoresis with 4 mg/ml dexamethasome, Moist heat, traction, Ultrasound, gait training, Therapeutic exercise, balance training, neuromuscular re-education, patient/family education, , manual techniques, passive ROM, dry needling, taping, vasopnuematic device, vestibular, spinal manipulations, joint manipulations 97110-Therapeutic exercises, 97530- Therapeutic activity, 97112- Neuromuscular re-education, 97535- Self Care, and 40981- Manual therapy  PLAN FOR NEXT SESSION: How was DN from last time and repeat if desired. Improve shoulder ROM and strength as tolerated.  April Manson, PT,DPT 06/26/2023, 2:43 PM

## 2023-07-03 ENCOUNTER — Encounter: Payer: Self-pay | Admitting: Rehabilitative and Restorative Service Providers"

## 2023-07-03 ENCOUNTER — Ambulatory Visit: Payer: BC Managed Care – PPO | Admitting: Rehabilitative and Restorative Service Providers"

## 2023-07-03 DIAGNOSIS — M542 Cervicalgia: Secondary | ICD-10-CM | POA: Diagnosis not present

## 2023-07-03 DIAGNOSIS — M25511 Pain in right shoulder: Secondary | ICD-10-CM | POA: Diagnosis not present

## 2023-07-03 DIAGNOSIS — G8929 Other chronic pain: Secondary | ICD-10-CM | POA: Diagnosis not present

## 2023-07-03 DIAGNOSIS — M6281 Muscle weakness (generalized): Secondary | ICD-10-CM

## 2023-07-03 NOTE — Therapy (Addendum)
OUTPATIENT PHYSICAL THERAPY CERVICAL TREATMENT/ PROGRESS NOTE  Progress Note Reporting Period 06/03/2023 to 07/03/2023  See note below for Objective Data and Assessment of Progress/Goals.     Patient Name: Veronica Lynch MRN: [id number] DOB:[birth year], 57 y.o., female Today's Date: 07/03/2023  END OF SESSION:  PT End of Session - 07/03/23 1705     Visit Number 4    Number of Visits 10    Date for PT Re-Evaluation 07/15/23    Authorization Type BCBS    PT Start Time 1518    PT Stop Time 1559    PT Time Calculation (min) 41 min    Activity Tolerance Patient tolerated treatment well;No increased pain;Patient limited by pain    Behavior During Therapy [facility] for tasks assessed/performed             Past Medical History:  Diagnosis Date   Allergy    CVA (cerebral vascular accident) (HCC) 2018   CVA (cerebral vascular accident) (HCC) 2017   Diabetes (HCC)    Exertional chest pain 11/17/2020   Exertional dyspnea 11/17/2020   GERD (gastroesophageal reflux disease) 2010   Hyperlipidemia    Hypertension    Palpitations 11/17/2020   Shingles    Past Surgical History:  Procedure Laterality Date   CHOLECYSTECTOMY     CHOLECYSTECTOMY  1990   CORONARY STENT INTERVENTION N/A 05/16/2021   Procedure: CORONARY STENT INTERVENTION;  Surgeon: Elder Negus, MD;  Location: [facility] INVASIVE CV LAB;  Service: Cardiovascular;  Laterality: N/A;   CYST REMOVAL HAND Right    wrist   CYST REMOVAL LEG     left knee   CYST REMOVAL LEG Left    knee   HERNIA REPAIR     HYSTEROTOMY     LAPAROSCOPIC GASTRIC BANDING     LAPAROSCOPIC GASTRIC BANDING  2010   LEFT HEART CATH AND CORONARY ANGIOGRAPHY N/A 05/16/2021   Procedure: LEFT HEART CATH AND CORONARY ANGIOGRAPHY;  Surgeon: Elder Negus, MD;  Location: [facility] INVASIVE CV LAB;  Service: Cardiovascular;  Laterality: N/A;   ROTATOR CUFF REPAIR Left    SHOULDER OPEN ROTATOR CUFF REPAIR     TOTAL ABDOMINAL HYSTERECTOMY     TUBAL  LIGATION     Patient Active Problem List   Diagnosis Date Noted   Lump in throat 06/15/2023   Otalgia of left ear 06/15/2023   Encounter for annual health examination 05/13/2023   Vitamin D deficiency 05/13/2023   Acute pain of right shoulder 05/13/2023   Right arm numbness 05/13/2023   Class 1 obesity due to excess calories with serious comorbidity and body mass index (BMI) of 31.0 to 31.9 in adult 03/15/2023   Hypokalemia 03/01/2023   Weakness 03/01/2023   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) 01/30/2023   Encounter for dental examination 01/30/2023   Encounter for screening mammogram for breast cancer 01/30/2023   Class 1 obesity due to excess calories without serious comorbidity with body mass index (BMI) of 32.0 to 32.9 in adult 01/30/2023   Type 2 diabetes mellitus with hyperlipidemia (HCC) 01/30/2023   Elevated coronary artery calcium score    Abnormal stress test 05/15/2021   Pain in right wrist 05/09/2021   De Quervain's tenosynovitis, right 04/13/2021   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   Coronary artery disease of native artery of native heart with stable angina pectoris (HCC) 12/17/2020   Statin intolerance 12/17/2020   Essential hypertension 11/17/2020   Mixed hyperlipidemia 11/17/2020    PCP:  [name], FNP   REFERRING PROVIDER: Kirtland Bouchard, PA-C   REFERRING DIAG: [phone] (ICD-10-CM) - Radiculopathy, cervical region  THERAPY DIAG:  Cervicalgia  Muscle weakness (generalized)  Chronic right shoulder pain  Rationale for Evaluation and Treatment: Rehabilitation  ONSET DATE: 2 years onset of pain  SUBJECTIVE:                                                                                                                                                                                                         SUBJECTIVE STATEMENT: Nneoma notes significant progress in the numbness and tingling in her Rt arm.  However, she has  significant Rt scapular and arm pain.  Reaching and sleeping are very limited.  Hand dominance: Right  PERTINENT HISTORY:  Previous Lt RTC repair, CVA, DM, CAD  PAIN:  NPRS scale: Can be 10/10 at night and with certain "catches" Pain location: Rt scapula and arm, forearm and distal is significantly improved Pain description: intermittent, dull, ache Aggravating factors: reaching, using arm, handwritting, laying on Rt side, sleeping Relieving factors: heat   PRECAUTIONS: None  RED FLAGS: None     WEIGHT BEARING RESTRICTIONS: No  FALLS:  Has patient fallen in last 6 months? 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  DIAGNOSTIC FINDINGS:  Cervical spine 2 views: No acute fractures.  Disc base overall  well-maintained.  Anterior vertebral spurring at C3 and C5.  Otherwise no  significant degenerative changes.   Right shoulder 3 views: Shoulder is well located.  No acute fracture.   Glenohumeral joint is well-maintained.  No significant arthritic changes.    PATIENT SURVEYS:  07/03/2023 FOTO 63  Eval FOTO 69% functional goal is 75%  COGNITION: Overall cognitive status: Within functional limits for tasks assessed  SENSATION: WFL  PALPATION: TTP in Rt upper trap and Rt cervical P.S, Rt deltoids   CERVICAL ROM:   Active ROM A/PROM (%) eval  Flexion 75%  Extension 50%  Right lateral flexion 50%  Left lateral flexion 75%  Right rotation 75%  Left rotation 75%   (Blank rows = not tested)  UPPER EXTREMITY ROM:  Active ROM Right eval Left/Right 07/03/2023  Shoulder flexion A:125 P:WFL 160/120  Shoulder extension    Shoulder abduction 80   Shoulder horizontal adduction  45/20  Shoulder extension    Shoulder internal rotation  Left  illiac crest 55/40  Shoulder external rotation WFL behind head 80/75  Elbow flexion    Elbow extension    Wrist flexion    Wrist extension    Wrist ulnar deviation    Wrist radial deviation    Wrist pronation    Wrist supination     (Blank rows = not tested)  UPPER EXTREMITY MMT:  MMT Right eval Left eval  Shoulder flexion 3+   Shoulder extension    Shoulder abduction 3   Shoulder adduction    Shoulder extension    Shoulder internal rotation 3   Shoulder external rotation 3   Middle trapezius    Lower trapezius    Elbow flexion    Elbow extension    Wrist flexion    Wrist extension    Wrist ulnar deviation    Wrist radial deviation    Wrist pronation    Wrist supination    Grip strength     (Blank rows = not tested)  CERVICAL SPECIAL TESTS:  Spurling's test: Positive + Rt shoulder impingment testing Inconclusive drop arm test, can resist this some but not all the way and has increased pain.   TODAY'S TREATMENT:  07/03/2023 Shoulder blade pinch 10 x 5 seconds Rows with Green Thera-Band 20 x 3 seconds Thera-Band ER Red 10 x Thera-Band Extension Red 10 x 3 seconds Standing shoulder flexion 10 x 3 seconds Verbally reviewed other 3 HEP activities with patient demonstration  Functional Activities/Education: Shoulder anatomy review with model and discussion of possible RTC tear along with improving cervical radiculopathy.   06/26/23 UBE L2 6 min, switch halfway Manual therapy performed by Ivery Quale, PT,DPT for active compression and skilled palpation and Trigger Point Dry-Needling  Treatment instructions: Expect mild to moderate muscle soreness. Patient Consent Given: Yes Education handout provided: verbally provided Muscles treated: Right infraspinatus, deltoids, RTC insertion, bicep Estim combined: No Treatment response/outcome: good overall tolerance,twitch response noted  Moist heat X 7 min not included in billed time IR stretch with strap behind back  5 sec X 10 across and 5 sec X 10 up Shoulder isometrics at door frame IR and ER 5 sec X 10 each Shoulder flexion AAROM 1# bar X 10   06/18/23 UBE L2 6 min, switch halfway Rows green 2X10 Extensions red 2X10 Shoulder ER red 2X10 Shoulder IR red 2X10 Cervical rotation AAROM with towel 5 sec X 10 bilat Cervical extension SNAG with towel 5 sec X 10 Upper trap stretch 30 sec X 2 bilat Cervical retractions X 10 Wall ladder X 10 into flexion, 5 sec hold Doorway stretch 20 sec X 3   Eval HEP creation and review with demonstration and trial set preformed, see below for details   PATIENT EDUCATION: Education details: HEP, PT plan of care Person educated: Patient Education method: Explanation, Demonstration, Verbal cues, and Handouts Education comprehension: verbalized understanding and needs further education   HOME EXERCISE PROGRAM: Access Code: [email or web address] URL: [email or web address] Date: 06/03/2023 Prepared by: Ivery Quale  Exercises - Seated Cervical Retraction  - 2 x daily - 6 x weekly - 1 sets - 10 reps - 5 sec hold - Seated Cervical Sidebending Stretch  - 2 x daily - 6 x weekly - 3 sets - 30 sec hold - Doorway Pec Stretch at 90 Degrees Abduction  - 2 x daily - 6 x weekly - 5 sets - 10 sec hold - Standing Shoulder Row with Anchored Resistance  - 2 x daily - 6 x weekly -  2 sets - 10-15 reps - Shoulder Extension with Resistance - Neutral  - 2 x daily - 6 x weekly - 2 sets - 10-15 reps - Shoulder External Rotation with Anchored Resistance  - 2 x daily - 6 x weekly - 2 sets - 10 reps - Supine Shoulder Flexion AAROM with Hands Clasped  - 2 x daily - 6 x weekly - 1-2 sets - 10 reps - 5 sec hold  ASSESSMENT:  CLINICAL IMPRESSION: Kamirra may have 2 separate issues.  Cervical radiculopathy, which is improving (no more tingling and numbness distal from the elbow to the fingers) and possible rotator cuff pathology.  Temera has difficulty at night and difficulty with  reaching and overhead function.  This really hasn't improved with supervised PT and she may be a candidate for additional medical screening (MRI).  Pranjal had a previous RTC repair on the left and may be a candidate for the right side.  She will follow-up with Rexene Edison PA-C to get additional recommendations and will continue her PT independently until that time.  If she has a rotator cuff tear and is a surgical candidate, she will be DC form supervised PT.  If not, she is welcome to return for further rehabilitation if needed.    OBJECTIVE IMPAIRMENTS: decreased activity tolerance, decreased shoulder mobility, decreased ROM, decreased strength, impaired flexibility, impaired UE use, postural dysfunction, and pain.  ACTIVITY LIMITATIONS: reaching, lifting, carry,  cleaning, driving, and or occupation  PERSONAL FACTORS:  also affecting patient's functional outcome.  REHAB POTENTIAL: Good  CLINICAL DECISION MAKING: Stable/uncomplicated  EVALUATION COMPLEXITY: Low    GOALS: Short term PT Goals Target date: 07/01/2023   Pt will be I and compliant with HEP. Baseline:  Goal status: MET 06/26/23  Long term PT goals Target date:07/15/2023   Pt will improve Rt shoulder AROM to [facility] to improve functional reaching Baseline: Goal status: On going 07/03/23  Pt will improve  Rt shoulder strength to at least 4+/5 MMT to improve functional strength Baseline: Goal status: On Going 07/03/23  Pt will improve FOTO to at least 75% functional to show improved function Baseline: Goal status: On Going 07/03/23  Pt will reduce pain to overall less than 3/10 with usual activity and work activity. Baseline: Goal status: On Going 07/03/23  PLAN: PT FREQUENCY: Wait and get recommendations from Rexene Edison PA-C after additional medical screening and possible MRI of the right shoulder  PT DURATION: See above  PLANNED INTERVENTIONS (unless contraindicated): aquatic PT, Canalith repositioning,  cryotherapy, Electrical stimulation, Iontophoresis with 4 mg/ml dexamethasome, Moist heat, traction, Ultrasound, gait training, Therapeutic exercise, balance training, neuromuscular re-education, patient/family education, , manual techniques, passive ROM, dry needling, taping, vasopnuematic device, vestibular, spinal manipulations, joint manipulations 97110-Therapeutic exercises, 97530- Therapeutic activity, 97112- Neuromuscular re-education, 97535- Self Care, and 16109- Manual therapy  PLAN FOR NEXT SESSION: How did appointment with Bronson Curb go?  RTC tear?  Surgery?  Cherlyn Cushing, PT, MPT 07/03/2023, 5:19 PM  PHYSICAL THERAPY DISCHARGE SUMMARY  Visits from Start of Care: 4  Current functional level related to goals / functional outcomes: See above   Remaining deficits: See above   Education / Equipment: HEP  Plan:  Patient goals were not met. Patient is being discharged due to not returning since last visit >30 days  Ivery Quale, PT, DPT 08/06/23 8:42 AM

## 2023-07-16 ENCOUNTER — Encounter: Payer: BC Managed Care – PPO | Admitting: Physical Therapy

## 2023-07-22 ENCOUNTER — Ambulatory Visit: Payer: Self-pay | Admitting: Physician Assistant

## 2023-07-29 ENCOUNTER — Encounter: Payer: Self-pay | Admitting: Physician Assistant

## 2023-07-29 ENCOUNTER — Other Ambulatory Visit: Payer: Self-pay | Admitting: Physician Assistant

## 2023-07-29 ENCOUNTER — Ambulatory Visit (INDEPENDENT_AMBULATORY_CARE_PROVIDER_SITE_OTHER): Payer: 59 | Admitting: Physician Assistant

## 2023-07-29 DIAGNOSIS — M5412 Radiculopathy, cervical region: Secondary | ICD-10-CM | POA: Diagnosis not present

## 2023-07-29 MED ORDER — CYCLOBENZAPRINE HCL 10 MG PO TABS: 10.0000 mg | ORAL_TABLET | Freq: Three times a day (TID) | ORAL | 1 refills | Status: DC | PRN

## 2023-07-29 MED ORDER — CYCLOBENZAPRINE HCL 10 MG PO TABS: 10.0000 mg | ORAL_TABLET | Freq: Three times a day (TID) | ORAL | 1 refills | Status: AC | PRN

## 2023-07-29 NOTE — Progress Notes (Signed)
 HPI: Veronica Lynch comes in today for neck and right shoulder pain.  She has been to therapy.  Continues to have pain that radiates down her right arm.  Cannot sleep due to the pain.  She notes that her arm goes limp at times.  She has taken Tylenol  PM.  And also Flexeril  she is found this somewhat beneficial.  Review of systems see HPI otherwise negative  Physical exam: General Well-developed well-nourished female no acute distress.  Affect appropriate. Cervical spine: Negative Spurling's.  Tenderness along medial border of the right scapula.  Good range of motion in the cervical spine without significant pain. Neurologic: Biceps triceps and brachial radialis reflexes are all normal and symmetric bilaterally.  Bilateral hands subjective decrease sensation right hand compared to left hand throughout.  Full motor bilateral hands. Upper extremities: 5 out of 5 strength throughout the upper extremities except for the right triceps which is 4 out of 5 strength compared to the left which is 5 out of 5 strength.  Impression: Cervical algia with radiculopathy right arm  Plan: Given the fact the patient has failed conservative treatment which is included therapy medications recommend MRI.  MRI of the cervical spine to evaluate for HNP as the source of her radiculopathy.  Have her follow-up after the MRI to go over results discuss further treatment.  Refill on her Flexeril  was given today.  Questions were encouraged and answered at length.

## 2023-07-29 NOTE — Addendum Note (Signed)
 Addended by: Richardean Canal on: 07/29/2023 05:29 PM   Modules accepted: Orders

## 2023-07-30 ENCOUNTER — Other Ambulatory Visit: Payer: Self-pay | Admitting: Radiology

## 2023-07-30 ENCOUNTER — Other Ambulatory Visit (HOSPITAL_COMMUNITY): Payer: Self-pay

## 2023-07-30 ENCOUNTER — Other Ambulatory Visit: Payer: Self-pay

## 2023-07-30 DIAGNOSIS — M5412 Radiculopathy, cervical region: Secondary | ICD-10-CM

## 2023-07-30 MED ORDER — CYCLOBENZAPRINE HCL 10 MG PO TABS
10.0000 mg | ORAL_TABLET | Freq: Three times a day (TID) | ORAL | 1 refills | Status: AC | PRN
  Filled 2023-07-30: qty 30, 10d supply, fill #0

## 2023-08-06 ENCOUNTER — Ambulatory Visit: Payer: BC Managed Care – PPO | Admitting: Nurse Practitioner

## 2023-08-12 ENCOUNTER — Encounter: Payer: Self-pay | Admitting: Physician Assistant

## 2023-08-16 ENCOUNTER — Ambulatory Visit
Admission: RE | Admit: 2023-08-16 | Discharge: 2023-08-16 | Disposition: A | Discharge status: Home / Self Care | Payer: 59 | Source: Ambulatory Visit | Attending: Physician Assistant | Admitting: Physician Assistant

## 2023-08-16 DIAGNOSIS — M5412 Radiculopathy, cervical region: Secondary | ICD-10-CM

## 2023-08-28 ENCOUNTER — Ambulatory Visit: Payer: 59 | Admitting: Orthopaedic Surgery

## 2023-08-28 ENCOUNTER — Encounter: Payer: Self-pay | Admitting: Orthopaedic Surgery

## 2023-08-28 ENCOUNTER — Encounter: Payer: Self-pay | Admitting: Nurse Practitioner

## 2023-08-28 ENCOUNTER — Ambulatory Visit: Payer: BC Managed Care – PPO | Admitting: Nurse Practitioner

## 2023-08-28 VITALS — BP 120/76 | HR 91 | Temp 98.6°F | Ht 66.0 in | Wt 192.0 lb

## 2023-08-28 DIAGNOSIS — M542 Cervicalgia: Secondary | ICD-10-CM

## 2023-08-28 DIAGNOSIS — I10 Essential (primary) hypertension: Secondary | ICD-10-CM

## 2023-08-28 DIAGNOSIS — E785 Hyperlipidemia, unspecified: Secondary | ICD-10-CM

## 2023-08-28 DIAGNOSIS — E66811 Obesity, class 1: Secondary | ICD-10-CM

## 2023-08-28 DIAGNOSIS — E1169 Type 2 diabetes mellitus with other specified complication: Secondary | ICD-10-CM | POA: Diagnosis not present

## 2023-08-28 DIAGNOSIS — M25511 Pain in right shoulder: Secondary | ICD-10-CM | POA: Diagnosis not present

## 2023-08-28 DIAGNOSIS — M5412 Radiculopathy, cervical region: Secondary | ICD-10-CM | POA: Diagnosis not present

## 2023-08-28 DIAGNOSIS — Z6830 Body mass index (BMI) 30.0-30.9, adult: Secondary | ICD-10-CM

## 2023-08-28 DIAGNOSIS — G8929 Other chronic pain: Secondary | ICD-10-CM

## 2023-08-28 DIAGNOSIS — Z5320 Procedure and treatment not carried out because of patient's decision for unspecified reasons: Secondary | ICD-10-CM | POA: Insufficient documentation

## 2023-08-28 DIAGNOSIS — Z2821 Immunization not carried out because of patient refusal: Secondary | ICD-10-CM

## 2023-08-28 DIAGNOSIS — E782 Mixed hyperlipidemia: Secondary | ICD-10-CM | POA: Diagnosis not present

## 2023-08-28 DIAGNOSIS — E6609 Other obesity due to excess calories: Secondary | ICD-10-CM

## 2023-08-28 MED ORDER — METHYLPREDNISOLONE ACETATE 40 MG/ML IJ SUSP
40.0000 mg | INTRAMUSCULAR | Status: AC | PRN
  Administered 2023-08-28: 40 mg via INTRA_ARTICULAR

## 2023-08-28 MED ORDER — LIDOCAINE HCL 1 % IJ SOLN
3.0000 mL | INTRAMUSCULAR | Status: AC | PRN
  Administered 2023-08-28: 3 mL

## 2023-08-28 NOTE — Assessment & Plan Note (Signed)
She has not taken the Ozempic due to the side effects, she never did start. She is focusing on diet and exercise.

## 2023-08-28 NOTE — Assessment & Plan Note (Signed)
She does not want to be screened and does not want to do a cologuard.

## 2023-08-28 NOTE — Progress Notes (Signed)
The patient comes in today go over MRI of her cervical spine.  She was having neck pain radiating from the right side of her neck going down into her hand.  She is a diabetic and does try to watch her blood glucose closely.  We had sent her to physical therapy as well.  She does report some right shoulder pain but she was having pain was waking her up at night and it appeared to be more radicular in nature.  MRIs reviewed with her of the cervical spine.  It does show degenerative changes throughout the cervical spine but there is no central or foraminal stenosis at any level.  There was no evidence of nerve impingement.  On exam today she has excellent range of motion of her right shoulder but is painful to put her through arc of motion of the shoulder.  At this point I felt it was reasonable to try at least a steroid injection in the subacromial space.  She had not had one before and I did counsel her about how this could definitely affect her blood glucose but to watch her carbohydrate intake closely.   She did tolerate the steroid injection well in her right shoulder subacromial alla today.  Will see her back in 2 weeks to see how she is doing overall.  If she continues to have pain that radiates into her hand, we may even consider right upper extremity nerve conduction studies.  If her symptoms get better or resolve, this is more likely a shoulder issue.    Procedure Note  Patient: Veronica Lynch             Date of Birth: 1966-01-14           MRN: [id number]             Visit Date: 08/28/2023  Procedures: Visit Diagnoses:  1. Radiculopathy, cervical region   2. Neck pain   3. Chronic right shoulder pain     Large Joint Inj: R subacromial bursa on 08/28/2023 9:54 AM Indications: pain and diagnostic evaluation Details: 22 G 1.5 in needle  Arthrogram: No  Medications: 3 mL lidocaine 1 %; 40 mg methylPREDNISolone acetate 40 MG/ML Outcome: tolerated well, no immediate  complications Procedure, treatment alternatives, risks and benefits explained, specific risks discussed. Consent was given by the patient. Immediately prior to procedure a time out was called to verify the correct patient, procedure, equipment, support staff and site/side marked as required. Patient was prepped and draped in the usual sterile fashion.

## 2023-08-28 NOTE — Assessment & Plan Note (Signed)
Blood pressure is controlled right at border for diastolic, continue current medications

## 2023-08-28 NOTE — Progress Notes (Signed)
[name], CMA,acting as a Neurosurgeon for [name], FNP.,have documented all relevant documentation on the behalf of [name], FNP,as directed by  [name], FNP while in the presence of [name], FNP.  Subjective:  Patient ID: Veronica Lynch , female    DOB: 1965-10-03 , 58 y.o.   MRN: [id number]  Chief Complaint  Patient presents with   Hypertension    HPI  Patient presents today for a bp and dm follow up, Patient reports compliance with medication. Patient denies any chest pain, SOB, or headaches. Patient has no concerns today. Patient reports she would like her chart to say she doesn't ever want any vaccine - do not ask her.   Patient states that she has palpitations at times, not weekly, sees Dr Rosemary Holms with cardiology in May.  Has seen Dr. Maureen Ralphs with orthopedics today for neck and shoulder pain.  Has had refills recently on medication  Past Medical History:  Diagnosis Date   Allergy    CVA (cerebral vascular accident) (HCC) 2018   CVA (cerebral vascular accident) (HCC) 2017   Diabetes (HCC)    Exertional chest pain 11/17/2020   Exertional dyspnea 11/17/2020   GERD (gastroesophageal reflux disease) 2010   Hyperlipidemia    Hypertension    Palpitations 11/17/2020   Shingles      Family History  Problem Relation Age of Onset   Hypertension Mother    Hyperlipidemia Mother    Osteoporosis Mother    Cancer Father    Sarcoidosis Sister    Diabetes Maternal Aunt      Current Outpatient Medications:    Ascorbic Acid (VITAMIN C) 1000 MG tablet, Take 1,000 mg by mouth daily., Disp: , Rfl:    Biotin 10 MG TABS, Take 10 mg by mouth daily., Disp: , Rfl:    celecoxib (CELEBREX) 200 MG capsule, TAKE 1 CAPSULE BY MOUTH EVERY DAY, Disp: 30 capsule, Rfl: 2   clopidogrel (PLAVIX) 75 MG tablet, Take 1 tablet (75 mg total) by mouth daily., Disp: 90 tablet, Rfl: 3   cyclobenzaprine (FLEXERIL) 10 MG tablet, Take 1 tablet (10 mg total) by mouth 3 (three) times  daily as needed for muscle spasms., Disp: 30 tablet, Rfl: 1   cyclobenzaprine (FLEXERIL) 10 MG tablet, Take 1 tablet (10 mg total) by mouth 3 (three) times daily as needed for muscle spasms., Disp: 30 tablet, Rfl: 1   Evolocumab (REPATHA SURECLICK) 140 MG/ML SOAJ, Inject 140 mg into the skin every 14 (fourteen) days., Disp: 6 mL, Rfl: 5   furosemide (LASIX) 20 MG tablet, Take 1 tablet (20 mg total) by mouth daily. As needed for leg swelling may repeat x 1 if not improved, Disp: 180 tablet, Rfl: 1   metoprolol succinate (TOPROL-XL) 50 MG 24 hr tablet, Take 1 tablet (50 mg total) by mouth daily. Take with or immediately following a meal., Disp: 90 tablet, Rfl: 3   nitroGLYCERIN (NITROSTAT) 0.4 MG SL tablet, Place 1 tablet (0.4 mg total) under the tongue every 5 (five) minutes as needed for chest pain., Disp: 30 tablet, Rfl: 2   ondansetron (ZOFRAN) 4 MG tablet, TAKE 1 TABLET BY MOUTH DAILY AS NEEDED FOR NAUSEA OR VOMITING., Disp: 18 tablet, Rfl: 1   TRUE METRIX BLOOD GLUCOSE TEST test strip, 1 each by Other route daily. Check blood sugar once daily, Disp: 100 each, Rfl: 3   empagliflozin (JARDIANCE) 25 MG TABS tablet, Take 1 tablet (25 mg total) by mouth daily before breakfast., Disp: 90  tablet, Rfl: 1   Allergies  Allergen Reactions   Bactrim [Sulfamethoxazole-Trimethoprim] Hives and Itching   Codeine Other (See Comments)    Pass out      Review of Systems  Constitutional: Negative.  Negative for fatigue.  HENT: Negative.    Eyes: Negative.   Respiratory: Negative.    Cardiovascular: Negative.   Gastrointestinal: Negative.      Today's Vitals   08/28/23 1558  BP: 120/76  Pulse: 91  Temp: 98.6 F (37 C)  TempSrc: Oral  Weight: 192 lb (87.1 kg)  Height: 5\' 6"  (1.676 m)  PainSc: 3   PainLoc: Head   Body mass index is 30.99 kg/m.  Wt Readings from Last 3 Encounters:  08/28/23 192 lb (87.1 kg)  06/06/23 199 lb 12.8 oz (90.6 kg)  05/07/23 191 lb 9.6 oz (86.9 kg)    The ASCVD  Risk score (Arnett DK, et al., 2019) failed to calculate for the following reasons:   Risk score cannot be calculated because patient has a medical history suggesting prior/existing ASCVD  Objective:  Physical Exam Vitals reviewed.  Constitutional:      General: She is not in acute distress.    Appearance: Normal appearance. She is obese.  Cardiovascular:     Rate and Rhythm: Normal rate and regular rhythm.     Pulses: Normal pulses.     Heart sounds: Normal heart sounds. No murmur heard. Pulmonary:     Effort: Pulmonary effort is normal. No respiratory distress.     Breath sounds: Normal breath sounds. No wheezing.  Skin:    General: Skin is warm and dry.     Capillary Refill: Capillary refill takes less than 2 seconds.  Neurological:     General: No focal deficit present.     Mental Status: She is alert and oriented to person, place, and time.     Cranial Nerves: No cranial nerve deficit.     Motor: No weakness.         Assessment And Plan:  Essential hypertension Assessment & Plan: Blood pressure is controlled right at border for diastolic, continue current medications  Orders: -     BMP8+eGFR  Mixed hyperlipidemia Assessment & Plan: Cholesterol levels are stable, continue statin.  Orders: -     Lipid panel  Type 2 diabetes mellitus with hyperlipidemia (HCC) Assessment & Plan: She has not taken the Ozempic due to the side effects, she never did start. She is focusing on diet and exercise.   Orders: -     Hemoglobin A1c  Class 1 obesity due to excess calories without serious comorbidity with body mass index (BMI) of 30.0 to 30.9 in adult Assessment & Plan: She is encouraged to strive for BMI less than 30 to decrease cardiac risk. Advised to aim for at least 150 minutes of exercise per week.    Colon cancer screening declined Assessment & Plan: She does not want to be screened and does not want to do a cologuard.    Immunization declined Assessment &  Plan: She declines all vaccines. Explained we often ask again about vaccines if a new vaccine has been updated.     No follow-ups on file.  Patient was given opportunity to ask questions. Patient verbalized understanding of the plan and was able to repeat key elements of the plan. All questions were answered to their satisfaction.    Jeanell Sparrow, FNP, have reviewed all documentation for this visit. The documentation on 08/28/23 for the exam,  diagnosis, procedures, and orders are all accurate and complete.   IF YOU HAVE BEEN REFERRED TO A SPECIALIST, IT MAY TAKE 1-2 WEEKS TO SCHEDULE/PROCESS THE REFERRAL. IF YOU HAVE NOT HEARD FROM US/SPECIALIST IN TWO WEEKS, PLEASE GIVE [place] A CALL AT [phone].

## 2023-08-28 NOTE — Assessment & Plan Note (Signed)
Cholesterol levels are stable, continue statin.

## 2023-08-29 ENCOUNTER — Other Ambulatory Visit: Payer: Self-pay | Admitting: Nurse Practitioner

## 2023-08-29 ENCOUNTER — Encounter: Payer: Self-pay | Admitting: Nurse Practitioner

## 2023-08-29 DIAGNOSIS — E1169 Type 2 diabetes mellitus with other specified complication: Secondary | ICD-10-CM

## 2023-08-29 LAB — LIPID PANEL
Chol/HDL Ratio: 2.3 {ratio} (ref 0.0–4.4)
Cholesterol, Total: 152 mg/dL (ref 100–199)
HDL: 66 mg/dL (ref >39)
LDL Chol Calc (NIH): 63 mg/dL (ref 0–99)
Triglycerides: 132 mg/dL (ref 0–149)
VLDL Cholesterol Cal: 23 mg/dL (ref 5–40)

## 2023-08-29 LAB — BMP8+EGFR
BUN/Creatinine Ratio: 10 (ref 9–23)
BUN: 7 mg/dL (ref 6–24)
CO2: 24 mmol/L (ref 20–29)
Calcium: 10 mg/dL (ref 8.7–10.2)
Chloride: 96 mmol/L (ref 96–106)
Creatinine, Ser: 0.7 mg/dL (ref 0.57–1.00)
Glucose: 202 mg/dL — ABNORMAL HIGH (ref 70–99)
Potassium: 4.1 mmol/L (ref 3.5–5.2)
Sodium: 139 mmol/L (ref 134–144)
eGFR: 100 mL/min/{1.73_m2} (ref >59)

## 2023-08-29 LAB — HEMOGLOBIN A1C
Est. average glucose Bld gHb Est-mCnc: 197 mg/dL
Hgb A1c MFr Bld: 8.5 % — ABNORMAL HIGH (ref 4.8–5.6)

## 2023-08-29 MED ORDER — EMPAGLIFLOZIN 25 MG PO TABS: 25.0000 mg | ORAL_TABLET | Freq: Every day | ORAL | 1 refills | Status: DC

## 2023-09-02 ENCOUNTER — Ambulatory Visit: Payer: 59 | Admitting: Orthopaedic Surgery

## 2023-09-04 ENCOUNTER — Encounter: Payer: Self-pay | Admitting: Nurse Practitioner

## 2023-09-04 NOTE — Assessment & Plan Note (Signed)
 She is encouraged to strive for BMI less than 30 to decrease cardiac risk. Advised to aim for at least 150 minutes of exercise per week.

## 2023-09-04 NOTE — Assessment & Plan Note (Signed)
She declines all vaccines. Explained we often ask again about vaccines if a new vaccine has been updated.

## 2023-09-05 ENCOUNTER — Ambulatory Visit: Payer: BC Managed Care – PPO | Admitting: Nurse Practitioner

## 2023-09-12 ENCOUNTER — Encounter: Payer: Self-pay | Admitting: Nurse Practitioner

## 2023-09-16 ENCOUNTER — Ambulatory Visit: Admitting: Nurse Practitioner

## 2023-09-16 NOTE — Progress Notes (Deleted)
 [name], CMA,acting as a Neurosurgeon for [name], FNP.,have documented all relevant documentation on the behalf of [name], FNP,as directed by  [name], FNP while in the presence of [name], FNP.  Subjective:  Patient ID: Veronica Lynch , female    DOB: 1965-09-07 , 58 y.o.   MRN: [id number]  No chief complaint on file.   HPI  HPI   Past Medical History:  Diagnosis Date   Allergy    CVA (cerebral vascular accident) (HCC) 2018   CVA (cerebral vascular accident) (HCC) 2017   Diabetes (HCC)    Exertional chest pain 11/17/2020   Exertional dyspnea 11/17/2020   GERD (gastroesophageal reflux disease) 2010   Hyperlipidemia    Hypertension    Palpitations 11/17/2020   Shingles      Family History  Problem Relation Age of Onset   Hypertension Mother    Hyperlipidemia Mother    Osteoporosis Mother    Cancer Father    Sarcoidosis Sister    Diabetes Maternal Aunt      Current Outpatient Medications:    Ascorbic Acid (VITAMIN C) 1000 MG tablet, Take 1,000 mg by mouth daily., Disp: , Rfl:    Biotin 10 MG TABS, Take 10 mg by mouth daily., Disp: , Rfl:    celecoxib (CELEBREX) 200 MG capsule, TAKE 1 CAPSULE BY MOUTH EVERY DAY, Disp: 30 capsule, Rfl: 2   clopidogrel (PLAVIX) 75 MG tablet, Take 1 tablet (75 mg total) by mouth daily., Disp: 90 tablet, Rfl: 3   cyclobenzaprine (FLEXERIL) 10 MG tablet, Take 1 tablet (10 mg total) by mouth 3 (three) times daily as needed for muscle spasms., Disp: 30 tablet, Rfl: 1   cyclobenzaprine (FLEXERIL) 10 MG tablet, Take 1 tablet (10 mg total) by mouth 3 (three) times daily as needed for muscle spasms., Disp: 30 tablet, Rfl: 1   empagliflozin (JARDIANCE) 25 MG TABS tablet, Take 1 tablet (25 mg total) by mouth daily before breakfast., Disp: 90 tablet, Rfl: 1   Evolocumab (REPATHA SURECLICK) 140 MG/ML SOAJ, Inject 140 mg into the skin every 14 (fourteen) days., Disp: 6 mL, Rfl: 5   furosemide (LASIX) 20 MG tablet, Take 1 tablet (20  mg total) by mouth daily. As needed for leg swelling may repeat x 1 if not improved, Disp: 180 tablet, Rfl: 1   metoprolol succinate (TOPROL-XL) 50 MG 24 hr tablet, Take 1 tablet (50 mg total) by mouth daily. Take with or immediately following a meal., Disp: 90 tablet, Rfl: 3   nitroGLYCERIN (NITROSTAT) 0.4 MG SL tablet, Place 1 tablet (0.4 mg total) under the tongue every 5 (five) minutes as needed for chest pain., Disp: 30 tablet, Rfl: 2   ondansetron (ZOFRAN) 4 MG tablet, TAKE 1 TABLET BY MOUTH DAILY AS NEEDED FOR NAUSEA OR VOMITING., Disp: 18 tablet, Rfl: 1   TRUE METRIX BLOOD GLUCOSE TEST test strip, 1 each by Other route daily. Check blood sugar once daily, Disp: 100 each, Rfl: 3   Allergies  Allergen Reactions   Bactrim [Sulfamethoxazole-Trimethoprim] Hives and Itching   Codeine Other (See Comments)    Pass out      Review of Systems   There were no vitals filed for this visit. There is no height or weight on file to calculate BMI.  Wt Readings from Last 3 Encounters:  08/28/23 192 lb (87.1 kg)  06/06/23 199 lb 12.8 oz (90.6 kg)  05/07/23 191 lb 9.6 oz (86.9 kg)    The ASCVD Risk  score (Arnett DK, et al., 2019) failed to calculate for the following reasons:   Risk score cannot be calculated because patient has a medical history suggesting prior/existing ASCVD  Objective:  Physical Exam      Assessment And Plan:  There are no diagnoses linked to this encounter.  No follow-ups on file.  Patient was given opportunity to ask questions. Patient verbalized understanding of the plan and was able to repeat key elements of the plan. All questions were answered to their satisfaction.    Jeanell Sparrow, FNP, have reviewed all documentation for this visit. The documentation on 09/16/23 for the exam, diagnosis, procedures, and orders are all accurate and complete.   IF YOU HAVE BEEN REFERRED TO A SPECIALIST, IT MAY TAKE 1-2 WEEKS TO SCHEDULE/PROCESS THE REFERRAL. IF YOU HAVE NOT  HEARD FROM US/SPECIALIST IN TWO WEEKS, PLEASE GIVE [place] A CALL AT [phone].

## 2023-09-18 ENCOUNTER — Other Ambulatory Visit (INDEPENDENT_AMBULATORY_CARE_PROVIDER_SITE_OTHER): Payer: Self-pay

## 2023-09-18 ENCOUNTER — Ambulatory Visit: Payer: 59 | Admitting: Orthopaedic Surgery

## 2023-09-18 DIAGNOSIS — M5441 Lumbago with sciatica, right side: Secondary | ICD-10-CM

## 2023-09-18 DIAGNOSIS — M5442 Lumbago with sciatica, left side: Secondary | ICD-10-CM | POA: Diagnosis not present

## 2023-09-18 NOTE — Progress Notes (Signed)
 The patient is seen today in follow-up after having a steroid injection in her right shoulder.  She said the shoulder injection was very helpful and she is about 75% better.  We had had a MRI of her cervical spine that showed no foraminal or central stenosis.  She said last week her back gave out on her and it "locked up and she could not really move her legs well.  She says it is better now but she wanted to mention this today.  She shows me around the lumbar spine where the source of her pain was and it looks like it is more at the facet joints.  There was no radicular component.  On exam today she has good flexion extension of her lumbar spine but she is definitely painful in the facet joint areas.  She has negative straight leg raise bilaterally.  She is moving her shoulder better as well.  2 views of the lumbar spine show normal alignment of the lumbar spine and AP and lateral views and normal disc heights.  We did discuss exercises that can help her facet joints feel better.  She does have Flexeril at home if she needs it.  Right now follow-up can be as needed since she is doing much better.  If things worsen she knows to reach out to [place] and let us know.

## 2023-10-31 ENCOUNTER — Other Ambulatory Visit: Payer: Self-pay | Admitting: Nurse Practitioner

## 2023-11-19 ENCOUNTER — Encounter: Payer: Self-pay | Admitting: Nurse Practitioner

## 2023-11-20 ENCOUNTER — Other Ambulatory Visit: Payer: Self-pay

## 2023-11-20 MED ORDER — TRUE METRIX BLOOD GLUCOSE TEST VI STRP: 1.0000 | ORAL_STRIP | Freq: Every day | 3 refills | Status: DC

## 2023-11-22 ENCOUNTER — Other Ambulatory Visit: Payer: Self-pay | Admitting: Nurse Practitioner

## 2023-11-25 ENCOUNTER — Other Ambulatory Visit: Payer: Self-pay | Admitting: Nurse Practitioner

## 2023-11-25 DIAGNOSIS — E1169 Type 2 diabetes mellitus with other specified complication: Secondary | ICD-10-CM

## 2023-11-25 MED ORDER — ACCU-CHEK GUIDE TEST VI STRP: ORAL_STRIP | 12 refills | Status: AC

## 2023-11-25 MED ORDER — ACCU-CHEK SOFTCLIX LANCETS MISC: 12 refills | Status: AC

## 2023-11-25 MED ORDER — ACCU-CHEK GUIDE ME W/DEVICE KIT: PACK | 0 refills | Status: DC

## 2023-12-22 ENCOUNTER — Other Ambulatory Visit: Payer: Self-pay | Admitting: Nurse Practitioner

## 2023-12-22 DIAGNOSIS — E1169 Type 2 diabetes mellitus with other specified complication: Secondary | ICD-10-CM

## 2023-12-27 ENCOUNTER — Other Ambulatory Visit: Payer: Self-pay | Admitting: Nurse Practitioner

## 2023-12-30 NOTE — Telephone Encounter (Signed)
 She needs an appt for this, not given in a year and has not been seen in 4 months with Type 2 diabetes

## 2024-01-08 ENCOUNTER — Encounter: Payer: Self-pay | Admitting: Internal Medicine

## 2024-01-08 ENCOUNTER — Ambulatory Visit: Admitting: Internal Medicine

## 2024-01-08 VITALS — BP 124/82 | HR 89 | Ht 66.0 in | Wt 193.0 lb

## 2024-01-08 DIAGNOSIS — E1169 Type 2 diabetes mellitus with other specified complication: Secondary | ICD-10-CM | POA: Diagnosis not present

## 2024-01-08 DIAGNOSIS — E1165 Type 2 diabetes mellitus with hyperglycemia: Secondary | ICD-10-CM

## 2024-01-08 DIAGNOSIS — E119 Type 2 diabetes mellitus without complications: Secondary | ICD-10-CM | POA: Insufficient documentation

## 2024-01-08 DIAGNOSIS — E785 Hyperlipidemia, unspecified: Secondary | ICD-10-CM | POA: Diagnosis not present

## 2024-01-08 DIAGNOSIS — Z7984 Long term (current) use of oral hypoglycemic drugs: Secondary | ICD-10-CM

## 2024-01-08 DIAGNOSIS — E1159 Type 2 diabetes mellitus with other circulatory complications: Secondary | ICD-10-CM

## 2024-01-08 LAB — POCT GLYCOSYLATED HEMOGLOBIN (HGB A1C): Hemoglobin A1C: 8.1 % — AB (ref 4.0–5.6)

## 2024-01-08 LAB — POCT GLUCOSE (DEVICE FOR HOME USE): POC Glucose: 286 mg/dL — AB (ref 70–99)

## 2024-01-08 MED ORDER — EMPAGLIFLOZIN 10 MG PO TABS: 10.0000 mg | ORAL_TABLET | Freq: Every day | ORAL | 3 refills | Status: AC

## 2024-01-08 NOTE — Patient Instructions (Signed)
 Start Jardiance  10 mg 1 tablet daily

## 2024-01-08 NOTE — Progress Notes (Signed)
 Name: Veronica Lynch  MRN/ DOB: [id number], 11-26-1965   Age/ Sex: 58 y.o., female    PCP: Georgina Speaks, FNP   Reason for Endocrinology Evaluation: Type 2 Diabetes Mellitus     Date of Initial Endocrinology Visit: 01/08/2024     PATIENT IDENTIFIER: Veronica Lynch is a 58 y.o. female with a past medical history of DM, CAD, HTN, and Hx gastric banding. The patient presented for initial endocrinology clinic visit on 01/08/2024 for consultative assistance with her diabetes management.    HPI: Ms. Wenberg was    Diagnosed with DM in the early 2000's and before having the Lap-Band surgery.  She was on metformin  at the time.  The patient went into remission and discontinued metformin  following Lap-Band surgery in 2010, patient had relapse in 2023 Prior Medications tried/Intolerance: Metformin  -no intolerance She was in remission until 2023. She was offered Ozempic  through her PCP, but patient opted to focus on lifestyle changes Currently checking blood sugars 1 x / day.   Hemoglobin A1c has ranged from 6.3% in 2022, peaking at 8.5% in 2025.  In terms of diet, the patient eats 2-3 meals a day, snacks on fruits , nuts . Avoids sugar-sweetened beverages.   Of note, the patient has a history of gastric banding, she started weight gain 2013 .  She follows with cardiology for CAD She follows with orthopedics for arthralgias  Has been working out and changing lifestyle changes   Denies nausea or vomiting  Denies constipation or diarrhea    HOME DIABETES REGIMEN:   Statin: Repatha   ACE-I/ARB: no  Prior Diabetic Education: yes   METER DOWNLOAD SUMMARY:      DIABETIC COMPLICATIONS: Microvascular complications:   Denies: CKD, neuropathy  Last eye exam: Completed 2025  Macrovascular complications:  CAD Denies:  PVD, CVA   PAST HISTORY: Past Medical History:  Past Medical History:  Diagnosis Date   Allergy    CVA (cerebral vascular accident) (HCC) 2018   CVA (cerebral  vascular accident) (HCC) 2017   Diabetes (HCC)    Exertional chest pain 11/17/2020   Exertional dyspnea 11/17/2020   GERD (gastroesophageal reflux disease) 2010   Hyperlipidemia    Hypertension    Palpitations 11/17/2020   Shingles    Past Surgical History:  Past Surgical History:  Procedure Laterality Date   CHOLECYSTECTOMY     CHOLECYSTECTOMY  1990   CORONARY STENT INTERVENTION N/A 05/16/2021   Procedure: CORONARY STENT INTERVENTION;  Surgeon: Elmira Newman PARAS, MD;  Location: [facility];  Service: Cardiovascular;  Laterality: N/A;   CYST REMOVAL HAND Right    wrist   CYST REMOVAL LEG     left knee   CYST REMOVAL LEG Left    knee   HERNIA REPAIR     HYSTEROTOMY     LAPAROSCOPIC GASTRIC BANDING     LAPAROSCOPIC GASTRIC BANDING  2010   LEFT HEART CATH AND CORONARY ANGIOGRAPHY N/A 05/16/2021   Procedure: LEFT HEART CATH AND CORONARY ANGIOGRAPHY;  Surgeon: Elmira Newman PARAS, MD;  Location: [facility];  Service: Cardiovascular;  Laterality: N/A;   ROTATOR CUFF REPAIR Left    SHOULDER OPEN ROTATOR CUFF REPAIR     TOTAL ABDOMINAL HYSTERECTOMY     TUBAL LIGATION      Social History:  reports that she has never smoked. She has never used smokeless tobacco. She reports that she does not currently use alcohol after a past usage of about 1.0 standard drink of alcohol  per week. She reports that she does not use drugs. Family History:  Family History  Problem Relation Age of Onset   Hypertension Mother    Hyperlipidemia Mother    Osteoporosis Mother    Cancer Father    Sarcoidosis Sister    Diabetes Maternal Aunt      HOME MEDICATIONS: Allergies as of 01/08/2024       Reactions   Bactrim [sulfamethoxazole-trimethoprim] Hives, Itching   Codeine Other (See Comments)   Pass out         Medication List        Accurate as of January 08, 2024  9:58 AM. If you have any questions, ask your nurse or doctor.          STOP taking these medications     empagliflozin  25 MG Tabs tablet Commonly known as: Jardiance  Stopped by: Bosten Newstrom J Thara Searing       TAKE these medications    Accu-Chek Guide Me w/Device Kit USE AS NEEDED TO CHECK BLOOD SUGARS.   Accu-Chek Guide Test test strip Generic drug: glucose blood Use as directed to check blood sugars once daily E11.69   Accu-Chek Guide Test test strip Generic drug: glucose blood Use as instructed   Accu-Chek Softclix Lancets lancets Use as instructed   Biotin 10 MG Tabs Take 10 mg by mouth daily.   celecoxib  200 MG capsule Commonly known as: CELEBREX  TAKE 1 CAPSULE BY MOUTH EVERY DAY   clopidogrel  75 MG tablet Commonly known as: PLAVIX  Take 1 tablet (75 mg total) by mouth daily.   cyclobenzaprine  10 MG tablet Commonly known as: FLEXERIL  Take 1 tablet (10 mg total) by mouth 3 (three) times daily as needed for muscle spasms.   cyclobenzaprine  10 MG tablet Commonly known as: FLEXERIL  Take 1 tablet (10 mg total) by mouth 3 (three) times daily as needed for muscle spasms.   furosemide  20 MG tablet Commonly known as: LASIX  Take 1 tablet (20 mg total) by mouth daily. As needed for leg swelling may repeat x 1 if not improved   furosemide  40 MG tablet Commonly known as: LASIX  Take 40 mg by mouth daily as needed.   metoprolol  succinate 50 MG 24 hr tablet Commonly known as: TOPROL -XL Take 1 tablet (50 mg total) by mouth daily. Take with or immediately following a meal.   nitroGLYCERIN  0.4 MG SL tablet Commonly known as: NITROSTAT  Place 1 tablet (0.4 mg total) under the tongue every 5 (five) minutes as needed for chest pain.   ondansetron  4 MG tablet Commonly known as: ZOFRAN  TAKE 1 TABLET BY MOUTH DAILY AS NEEDED FOR NAUSEA OR VOMITING   Repatha  SureClick 140 MG/ML Soaj Generic drug: Evolocumab  Inject 140 mg into the skin every 14 (fourteen) days.   vitamin C 1000 MG tablet Take 1,000 mg by mouth daily.         ALLERGIES: Allergies  Allergen Reactions    Bactrim [Sulfamethoxazole-Trimethoprim] Hives and Itching   Codeine Other (See Comments)    Pass out      REVIEW OF SYSTEMS: A comprehensive ROS was conducted with the patient and is negative except as per HPI     OBJECTIVE:   VITAL SIGNS: BP 124/82 (BP Location: Left Arm, Patient Position: Sitting, Cuff Size: Normal)   Pulse 89   Ht 5' 6 (1.676 m)   Wt 193 lb (87.5 kg)   SpO2 97%   BMI 31.15 kg/m    PHYSICAL EXAM:  General: Pt appears well and is in  NAD  Lungs: Clear with good BS bilat   Heart: RRR   Abdomen:  soft, nontender  Extremities:  Lower extremities - No pretibial edema.   Neuro: MS is good with appropriate affect, pt is alert and Ox3    DM foot exam: 01/08/2024  The skin of the feet is intact without sores or ulcerations. The pedal pulses are 2+ on right and 2+ on left. The sensation is intact to a screening 5.07, 10 gram monofilament bilaterally    DATA REVIEWED:  Lab Results  Component Value Date   HGBA1C 8.5 (H) 08/28/2023   HGBA1C 6.5 (H) 05/07/2023   HGBA1C 7.6 (H) 01/30/2023    Latest Reference Range & Units 08/28/23 16:47  Sodium 134 - 144 mmol/L 139  Potassium 3.5 - 5.2 mmol/L 4.1  Chloride 96 - 106 mmol/L 96  CO2 20 - 29 mmol/L 24  Glucose 70 - 99 mg/dL 797 (H)  BUN 6 - 24 mg/dL 7  Creatinine 9.42 - 8.99 mg/dL 9.29  Calcium 8.7 - 89.7 mg/dL 89.9  BUN/Creatinine Ratio 9 - 23  10  eGFR >59 mL/min/1.73 100  Total CHOL/HDL Ratio 0.0 - 4.4 ratio 2.3  Cholesterol, Total 100 - 199 mg/dL 847  HDL Cholesterol >60 mg/dL 66  Triglycerides 0 - 850 mg/dL 867  VLDL Cholesterol Cal 5 - 40 mg/dL 23  LDL Chol Calc (NIH) 0 - 99 mg/dL 63    In office BG 713 mg/DL   Old records , labs and images have been reviewed.    ASSESSMENT / PLAN / RECOMMENDATIONS:   1) Type 2 Diabetes Mellitus, poorly controlled, With macrovascular complications - Most recent A1c of 8.1 %. Goal A1c < 7.0 %.    Plan: GENERAL: I have discussed with the patient the  pathophysiology of diabetes. We went over the natural progression of the disease. We stressed the importance of lifestyle changes. I explained the complications associated with diabetes including retinopathy, nephropathy, neuropathy as well as increased risk of cardiovascular disease. We went over the benefit seen with glycemic control.  I explained to the patient that diabetic patients are at higher than normal risk for amputations.  Encouraged patient to continue with lifestyle changes including low-carb diet and exercise She has no intolerance to metformin  that she could recall She was prescribed Ozempic  through her PCP, did not try it, but was skeptical about side effects We discussed the importance of optimizing glucose control, and I have recommended considering glycemic agents.  We did discuss metformin , versus sulfonylureas, versus SGLT2 inhibitors Patient would like to try Jardiance , caution against genital side effects and importance of hydration  MEDICATIONS: Start Jardiance  10 mg daily  EDUCATION / INSTRUCTIONS: BG monitoring instructions: Patient is instructed to check her blood sugars 1 times . Call [facility] if: BG persistently < 70  I reviewed the Rule of 15 for the treatment of hypoglycemia in detail with the patient. Literature supplied.   2) Diabetic complications:  Eye: Does not have known diabetic retinopathy.  Neuro/ Feet: Does not have known diabetic peripheral neuropathy. Renal: Patient does not have known baseline CKD. She is not on an ACEI/ARB at present.    Follow-up in 3 months  Signed electronically by: Stefano Redgie Butts, MD  [facility] Endocrinology  [facility] [address] [place], [place] [address] Phone: [phone] FAX: [phone]   CC: Georgina Speaks, FNP [address] [place] [address] Phone: [phone]  Fax: [phone]  Return to Endocrinology clinic as  below: Future Appointments  Date Time Provider Department Center  05/11/2024  2:40 PM Georgina Speaks, FNP [facility] None

## 2024-02-08 ENCOUNTER — Other Ambulatory Visit: Payer: Self-pay | Admitting: Nurse Practitioner

## 2024-02-08 DIAGNOSIS — E1169 Type 2 diabetes mellitus with other specified complication: Secondary | ICD-10-CM

## 2024-04-10 ENCOUNTER — Encounter: Payer: Self-pay | Admitting: Internal Medicine

## 2024-04-10 ENCOUNTER — Ambulatory Visit: Admitting: Internal Medicine

## 2024-04-10 VITALS — BP 128/82 | HR 84 | Ht 66.0 in | Wt 176.0 lb

## 2024-04-10 DIAGNOSIS — E1159 Type 2 diabetes mellitus with other circulatory complications: Secondary | ICD-10-CM | POA: Diagnosis not present

## 2024-04-10 DIAGNOSIS — E785 Hyperlipidemia, unspecified: Secondary | ICD-10-CM

## 2024-04-10 DIAGNOSIS — E1169 Type 2 diabetes mellitus with other specified complication: Secondary | ICD-10-CM

## 2024-04-10 LAB — POCT GLYCOSYLATED HEMOGLOBIN (HGB A1C): Hemoglobin A1C: 7.2 % — AB (ref 4.0–5.6)

## 2024-04-10 NOTE — Progress Notes (Signed)
 Name: Veronica Lynch  MRN/ DOB: [id number], August 30, 1965   Age/ Sex: 58 y.o., female    PCP: Georgina Speaks, FNP   Reason for Endocrinology Evaluation: Type 2 Diabetes Mellitus     Date of Initial Endocrinology Visit: 01/08/2024    PATIENT IDENTIFIER: Veronica Lynch is a 58 y.o. female with a past medical history of DM, CAD, HTN, and Hx gastric banding. The patient presented for initial endocrinology clinic visit on 01/08/2024 for consultative assistance with her diabetes management.    HPI: Veronica Lynch was    Diagnosed with DM in the early 2000's and before having the Lap-Band surgery.  She was on metformin  at the time.  The patient went into remission and discontinued metformin  following Lap-Band surgery in 2010, patient had relapse in 2023 Prior Medications tried/Intolerance: Metformin  -no intolerance She was in remission until 2023. She was offered Ozempic  through her PCP, but patient opted to focus on lifestyle changes   Hemoglobin A1c has ranged from 6.3% in 2022, peaking at 8.5% in 2025.   Of note, the patient has a history of gastric banding, she started weight gain 2013 .    On her initial visit to our clinic she had an A1c of 8.1%, she was not on any glycemic agents as she had declined Ozempic  through her PCPs office wanted to focus on lifestyle changes.   The patient agreed to start Jardiance   SUBJECTIVE:   During the last visit (01/08/2024): A1c 8.1%  Today (04/10/24): Veronica Lynch Fam is here for follow-up on diabetes management.  She checks blood sugars 1 times daily. The patient has not had hypoglycemic episodes since the last clinic visit.   The patient follows with cardiology for CAD She also follows with orthopedics for arthralgias Patient has been noted with weight loss since her last visit here No nausea or vomiting  No constipation or diarrhea  No genital irritation    HOME DIABETES REGIMEN: Jardiance  10 mg daily   Statin: Repatha   ACE-I/ARB: no   Prior Diabetic Education: yes   METER DOWNLOAD SUMMARY:   134 - 226 mg/dL    DIABETIC COMPLICATIONS: Microvascular complications:   Denies: CKD, neuropathy  Last eye exam: Completed 2025  Macrovascular complications:  CAD Denies:  PVD, CVA   PAST HISTORY: Past Medical History:  Past Medical History:  Diagnosis Date   Allergy    CVA (cerebral vascular accident) (HCC) 2018   CVA (cerebral vascular accident) (HCC) 2017   Diabetes (HCC)    Exertional chest pain 11/17/2020   Exertional dyspnea 11/17/2020   GERD (gastroesophageal reflux disease) 2010   Hyperlipidemia    Hypertension    Palpitations 11/17/2020   Shingles    Past Surgical History:  Past Surgical History:  Procedure Laterality Date   CHOLECYSTECTOMY     CHOLECYSTECTOMY  1990   CORONARY STENT INTERVENTION N/A 05/16/2021   Procedure: CORONARY STENT INTERVENTION;  Surgeon: Elmira Newman PARAS, MD;  Location: [facility] INVASIVE [facility];  Service: Cardiovascular;  Laterality: N/A;   CYST REMOVAL HAND Right    wrist   CYST REMOVAL LEG     left knee   CYST REMOVAL LEG Left    knee   HERNIA REPAIR     HYSTEROTOMY     LAPAROSCOPIC GASTRIC BANDING     LAPAROSCOPIC GASTRIC BANDING  2010   LEFT HEART CATH AND CORONARY ANGIOGRAPHY N/A 05/16/2021   Procedure: LEFT HEART CATH AND CORONARY ANGIOGRAPHY;  Surgeon: Elmira Newman PARAS, MD;  Location: [facility]  INVASIVE CV LAB;  Service: Cardiovascular;  Laterality: N/A;   ROTATOR CUFF REPAIR Left    SHOULDER OPEN ROTATOR CUFF REPAIR     TOTAL ABDOMINAL HYSTERECTOMY     TUBAL LIGATION      Social History:  reports that she has never smoked. She has never used smokeless tobacco. She reports that she does not currently use alcohol after a past usage of about 1.0 standard drink of alcohol per week. She reports that she does not use drugs. Family History:  Family History  Problem Relation Age of Onset   Hypertension Mother    Hyperlipidemia Mother    Osteoporosis Mother     Cancer Father    Sarcoidosis Sister    Diabetes Maternal Aunt      HOME MEDICATIONS: Allergies as of 04/10/2024       Reactions   Bactrim [sulfamethoxazole-trimethoprim] Hives, Itching   Codeine Other (See Comments)   Pass out         Medication List        Accurate as of April 10, 2024  2:02 PM. If you have any questions, ask your nurse or doctor.          Accu-Chek Guide Me w/Device Kit USE AS NEEDED TO CHECK BLOOD SUGARS.   Accu-Chek Guide Test test strip Generic drug: glucose blood Use as directed to check blood sugars once daily E11.69   Accu-Chek Guide Test test strip Generic drug: glucose blood Use as instructed   Accu-Chek Softclix Lancets lancets Use as instructed   Biotin 10 MG Tabs Take 10 mg by mouth daily.   celecoxib  200 MG capsule Commonly known as: CELEBREX  TAKE 1 CAPSULE BY MOUTH EVERY DAY   clopidogrel  75 MG tablet Commonly known as: PLAVIX  Take 1 tablet (75 mg total) by mouth daily.   cyclobenzaprine  10 MG tablet Commonly known as: FLEXERIL  Take 1 tablet (10 mg total) by mouth 3 (three) times daily as needed for muscle spasms.   cyclobenzaprine  10 MG tablet Commonly known as: FLEXERIL  Take 1 tablet (10 mg total) by mouth 3 (three) times daily as needed for muscle spasms.   empagliflozin  10 MG Tabs tablet Commonly known as: Jardiance  Take 1 tablet (10 mg total) by mouth daily before breakfast.   furosemide  20 MG tablet Commonly known as: LASIX  Take 1 tablet (20 mg total) by mouth daily. As needed for leg swelling may repeat x 1 if not improved   furosemide  40 MG tablet Commonly known as: LASIX  Take 40 mg by mouth daily as needed.   metoprolol  succinate 50 MG 24 hr tablet Commonly known as: TOPROL -XL Take 1 tablet (50 mg total) by mouth daily. Take with or immediately following a meal.   nitroGLYCERIN  0.4 MG SL tablet Commonly known as: NITROSTAT  Place 1 tablet (0.4 mg total) under the tongue every 5 (five) minutes as  needed for chest pain.   ondansetron  4 MG tablet Commonly known as: ZOFRAN  TAKE 1 TABLET BY MOUTH DAILY AS NEEDED FOR NAUSEA OR VOMITING   Repatha  SureClick 140 MG/ML Soaj Generic drug: Evolocumab  Inject 140 mg into the skin every 14 (fourteen) days.   vitamin C 1000 MG tablet Take 1,000 mg by mouth daily.         ALLERGIES: Allergies  Allergen Reactions   Bactrim [Sulfamethoxazole-Trimethoprim] Hives and Itching   Codeine Other (See Comments)    Pass out      REVIEW OF SYSTEMS: A comprehensive ROS was conducted with the patient and is negative  except as per HPI     OBJECTIVE:   VITAL SIGNS: BP 128/82 (BP Location: Left Arm, Patient Position: Sitting, Cuff Size: Large)   Pulse 84   Ht 5' 6 (1.676 m)   Wt 176 lb (79.8 kg) Comment: Patient reported  SpO2 99%   BMI 28.41 kg/m    PHYSICAL EXAM:  General: Pt appears well and is in NAD  Lungs: Clear with good BS bilat   Heart: RRR   Abdomen:  soft, nontender  Extremities:  Lower extremities - No pretibial edema.   Neuro: MS is good with appropriate affect, pt is alert and Ox3    DM foot exam: 01/08/2024  The skin of the feet is intact without sores or ulcerations. The pedal pulses are 2+ on right and 2+ on left. The sensation is intact to a screening 5.07, 10 gram monofilament bilaterally    DATA REVIEWED:  Lab Results  Component Value Date   HGBA1C 8.1 (A) 01/08/2024   HGBA1C 8.5 (H) 08/28/2023   HGBA1C 6.5 (H) 05/07/2023    Latest Reference Range & Units 08/28/23 16:47  Sodium 134 - 144 mmol/L 139  Potassium 3.5 - 5.2 mmol/L 4.1  Chloride 96 - 106 mmol/L 96  CO2 20 - 29 mmol/L 24  Glucose 70 - 99 mg/dL 797 (H)  BUN 6 - 24 mg/dL 7  Creatinine 9.42 - 8.99 mg/dL 9.29  Calcium 8.7 - 89.7 mg/dL 89.9  BUN/Creatinine Ratio 9 - 23  10  eGFR >59 mL/min/1.73 100  Total CHOL/HDL Ratio 0.0 - 4.4 ratio 2.3  Cholesterol, Total 100 - 199 mg/dL 847  HDL Cholesterol >60 mg/dL 66  Triglycerides 0 - 850 mg/dL  867  VLDL Cholesterol Cal 5 - 40 mg/dL 23  LDL Chol Calc (NIH) 0 - 99 mg/dL 63    Old records , labs and images have been reviewed.    ASSESSMENT / PLAN / RECOMMENDATIONS:   1) Type 2 Diabetes Mellitus, poorly controlled, With macrovascular complications - Most recent A1c of 7.2%. Goal A1c < 7.0 %.    - A1c has trended down from 8.1% to 7.2% -Praised the patient on approximately 16 LB's weight loss -She is tolerating Jardiance , no changes to the dose at this time, patient encouraged to continue lifestyle changes -She has been noted with elevated BG in the fasting status, I have asked the patient to alternate glucose checks between bedtime and fasting to try to figure out why her fasting BGs are high -She has no intolerance to metformin  that she could recall -She was prescribed Ozempic  through her PCP, did not try it, but was skeptical about side effects - Patient unable to provide urine sample for MA/CR ratio today  MEDICATIONS: Continue Jardiance  10 mg daily  EDUCATION / INSTRUCTIONS: BG monitoring instructions: Patient is instructed to check her blood sugars 1 times . Call [facility] if: BG persistently < 70  I reviewed the Rule of 15 for the treatment of hypoglycemia in detail with the patient. Literature supplied.   2) Diabetic complications:  Eye: Does not have known diabetic retinopathy.  Neuro/ Feet: Does not have known diabetic peripheral neuropathy. Renal: Patient does not have known baseline CKD. She is not on an ACEI/ARB at present.    Follow-up in 6 months  Signed electronically by: Stefano Redgie Butts, MD  [facility] Endocrinology  [facility] [address] [place], [place] [address] Phone: [phone] FAX: [phone]   CC: Georgina Speaks, FNP [phone]  [address] [place] [address] Phone: [phone]  Fax: [phone]    Return to Endocrinology clinic as below: Future Appointments  Date Time  Provider Department Center  05/11/2024  2:40 PM Georgina Speaks, FNP [facility] [phone] [place]

## 2024-04-10 NOTE — Patient Instructions (Signed)
 Continue  Jardiance  10 mg 1 tablet daily

## 2024-05-11 ENCOUNTER — Encounter: Payer: Self-pay | Admitting: Nurse Practitioner

## 2024-05-11 ENCOUNTER — Ambulatory Visit: Payer: Self-pay | Admitting: Nurse Practitioner

## 2024-05-11 VITALS — BP 130/70 | HR 86 | Temp 98.4°F | Ht 66.0 in | Wt 181.0 lb

## 2024-05-11 DIAGNOSIS — I119 Hypertensive heart disease without heart failure: Secondary | ICD-10-CM | POA: Diagnosis not present

## 2024-05-11 DIAGNOSIS — E1169 Type 2 diabetes mellitus with other specified complication: Secondary | ICD-10-CM | POA: Diagnosis not present

## 2024-05-11 DIAGNOSIS — E663 Overweight: Secondary | ICD-10-CM | POA: Insufficient documentation

## 2024-05-11 DIAGNOSIS — Z0000 Encounter for general adult medical examination without abnormal findings: Secondary | ICD-10-CM

## 2024-05-11 DIAGNOSIS — Z79899 Other long term (current) drug therapy: Secondary | ICD-10-CM

## 2024-05-11 DIAGNOSIS — Z139 Encounter for screening, unspecified: Secondary | ICD-10-CM

## 2024-05-11 DIAGNOSIS — E782 Mixed hyperlipidemia: Secondary | ICD-10-CM

## 2024-05-11 DIAGNOSIS — E559 Vitamin D deficiency, unspecified: Secondary | ICD-10-CM

## 2024-05-11 DIAGNOSIS — I25118 Atherosclerotic heart disease of native coronary artery with other forms of angina pectoris: Secondary | ICD-10-CM

## 2024-05-11 DIAGNOSIS — E785 Hyperlipidemia, unspecified: Secondary | ICD-10-CM

## 2024-05-11 LAB — POCT URINALYSIS DIP (CLINITEK)
Bilirubin, UA: NEGATIVE
Blood, UA: NEGATIVE
Glucose, UA: NEGATIVE mg/dL
Ketones, POC UA: NEGATIVE mg/dL
Leukocytes, UA: NEGATIVE
Nitrite, UA: NEGATIVE
POC PROTEIN,UA: NEGATIVE
Spec Grav, UA: 1.01 (ref 1.010–1.025)
Urobilinogen, UA: 0.2 U/dL
pH, UA: 7 (ref 5.0–8.0)

## 2024-05-11 NOTE — Assessment & Plan Note (Signed)
 Behavior modifications discussed and diet history reviewed.   Pt will continue to exercise regularly and modify diet with low GI, plant based foods and decrease intake of processed foods.  Recommend intake of daily multivitamin, Vitamin D , and calcium.  Recommend mammogram and colonoscopy for preventive screenings, as well as recommend immunizations that include influenza, TDAP, and Shingles  Routine visit with weight decrease and well-controlled blood pressure. - Review lab results for cholesterol. - Discuss exercise goals of at least 150 minutes per week.

## 2024-05-11 NOTE — Assessment & Plan Note (Signed)
 Weight decreased by almost 20 pounds since June, currently 181 pounds. Congratulated on her weight loss - Encouraged to maintain weight loss efforts.

## 2024-05-11 NOTE — Assessment & Plan Note (Signed)
 Will check vitamin D  level and supplement as needed.    Also encouraged to spend 15 minutes in the sun daily.

## 2024-05-11 NOTE — Assessment & Plan Note (Signed)
 Blood pressure is controlled right at border for diastolic, continue current medications EKG done with no change from previous

## 2024-05-11 NOTE — Assessment & Plan Note (Signed)
 A1c of 7.2% in September indicates improved glycemic control. - Continue current management with empagliflozin  (Jardiance ).  Cholesterol levels improved significantly in February. - Order repeat cholesterol lab tests to monitor lipid levels.

## 2024-05-11 NOTE — Progress Notes (Signed)
 Veronica Lynch, CMA,acting as a neurosurgeon for Veronica Ada, Veronica Lynch.,have documented all relevant documentation on the behalf of Veronica Ada, Veronica Lynch,as directed by  Veronica Ada, Veronica Lynch while in the presence of Veronica Ada, Veronica Lynch.  Subjective:    Patient ID: Veronica Lynch , female    DOB: [birth year] , 58 y.o.   MRN: [id number]  Chief Complaint  Patient presents with   Annual Exam    Patient presents today for HM, Patient reports compliance with medication. Patient denies any chest pain, SOB, or headaches. Patient has no concerns today.      HPI  Discussed the use of AI scribe software for clinical note transcription with the patient, who gave verbal consent to proceed.  History of Present Illness ZORAH BACKES is a 58 year old female who presents for an annual physical exam.  Her weight as of this morning is 181 pounds. She is not exercising as much as she used to, citing 'life got to life' as a reason for reduced activity. She started taking Jardiance , and her A1c decreased to 7.2 in September. No other health concerns or issues are reported.  Her diet is 'pretty regular' without eliminating or adding any specific foods.   Past Medical History:  Diagnosis Date   Allergy    CVA (cerebral vascular accident) (HCC) 2018   CVA (cerebral vascular accident) (HCC) 2017   Diabetes (HCC)    Exertional chest pain 11/17/2020   Exertional dyspnea 11/17/2020   GERD (gastroesophageal reflux disease) 2010   Hyperlipidemia    Hypertension    Palpitations 11/17/2020   Shingles      Family History  Problem Relation Age of Onset   Hypertension Mother    Hyperlipidemia Mother    Osteoporosis Mother    Cancer Father    Sarcoidosis Sister    Diabetes Maternal Aunt      Current Outpatient Medications:    Accu-Chek Softclix Lancets lancets, Use as instructed, Disp: 100 each, Rfl: 12   Ascorbic Acid (VITAMIN C) 1000 MG tablet, Take 1,000 mg by mouth daily., Disp: , Rfl:    Biotin 10 MG TABS, Take  10 mg by mouth daily., Disp: , Rfl:    Blood Glucose Monitoring Suppl (ACCU-CHEK GUIDE ME) w/Device KIT, USE AS NEEDED TO CHECK BLOOD SUGARS., Disp: 1 kit, Rfl: 1   celecoxib  (CELEBREX ) 200 MG capsule, TAKE 1 CAPSULE BY MOUTH EVERY DAY, Disp: 30 capsule, Rfl: 2   clopidogrel  (PLAVIX ) 75 MG tablet, Take 1 tablet (75 mg total) by mouth daily., Disp: 90 tablet, Rfl: 3   cyclobenzaprine  (FLEXERIL ) 10 MG tablet, Take 1 tablet (10 mg total) by mouth 3 (three) times daily as needed for muscle spasms., Disp: 30 tablet, Rfl: 1   cyclobenzaprine  (FLEXERIL ) 10 MG tablet, Take 1 tablet (10 mg total) by mouth 3 (three) times daily as needed for muscle spasms., Disp: 30 tablet, Rfl: 1   empagliflozin  (JARDIANCE ) 10 MG TABS tablet, Take 1 tablet (10 mg total) by mouth daily before breakfast., Disp: 90 tablet, Rfl: 3   Evolocumab  (REPATHA  SURECLICK) 140 MG/ML SOAJ, Inject 140 mg into the skin every 14 (fourteen) days., Disp: 6 mL, Rfl: 5   furosemide  (LASIX ) 40 MG tablet, Take 40 mg by mouth daily as needed., Disp: , Rfl:    glucose blood (ACCU-CHEK GUIDE TEST) test strip, Use as directed to check blood sugars once daily E11.69, Disp: 100 strip, Rfl: 2   glucose blood (ACCU-CHEK GUIDE TEST) test strip, Use as instructed,  Disp: 100 each, Rfl: 12   metoprolol  succinate (TOPROL -XL) 50 MG 24 hr tablet, Take 1 tablet (50 mg total) by mouth daily. Take with or immediately following a meal., Disp: 90 tablet, Rfl: 3   nitroGLYCERIN  (NITROSTAT ) 0.4 MG SL tablet, Place 1 tablet (0.4 mg total) under the tongue every 5 (five) minutes as needed for chest pain., Disp: 30 tablet, Rfl: 2   ondansetron  (ZOFRAN ) 4 MG tablet, TAKE 1 TABLET BY MOUTH DAILY AS NEEDED FOR NAUSEA OR VOMITING, Disp: 18 tablet, Rfl: 1   furosemide  (LASIX ) 20 MG tablet, Take 1 tablet (20 mg total) by mouth daily. As needed for leg swelling may repeat x 1 if not improved (Patient not taking: Reported on 05/11/2024), Disp: 180 tablet, Rfl: 1   Allergies   Allergen Reactions   Bactrim [Sulfamethoxazole-Trimethoprim] Hives and Itching   Codeine Other (See Comments)    Pass out       The patient states she uses status post hysterectomy for birth control. No LMP recorded. Patient has had a hysterectomy.  Negative for: breast discharge, breast lump(s), breast pain and breast self exam. Associated symptoms include abnormal vaginal bleeding. Pertinent negatives include abnormal bleeding (hematology), anxiety, decreased libido, depression, difficulty falling sleep, dyspareunia, history of infertility, nocturia, sexual dysfunction, sleep disturbances, urinary incontinence, urinary urgency, vaginal discharge and vaginal itching. Diet regular.  The patient states her exercise level is minimal - not as much as she used to due to life changes.   The patient's tobacco use is:  Social History   Tobacco Use  Smoking Status Never  Smokeless Tobacco Never   She has been exposed to passive smoke. The patient's alcohol use is:  Social History   Substance and Sexual Activity  Alcohol Use Not Currently   Alcohol/week: 1.0 standard drink of alcohol   Types: 1 Glasses of wine per week   Comment: occ wine    Review of Systems  Constitutional: Negative.  Negative for fever.  HENT: Negative.    Eyes: Negative.   Respiratory: Negative.    Cardiovascular: Negative.   Gastrointestinal: Negative.   Endocrine: Negative.   Genitourinary: Negative.   Musculoskeletal: Negative.   Skin: Negative.   Allergic/Immunologic: Negative.   Neurological:  Negative for numbness.  Hematological: Negative.   Psychiatric/Behavioral: Negative.       Today's Vitals   05/11/24 1442  BP: 130/70  Pulse: 86  Temp: 98.4 F (36.9 C)  TempSrc: Oral  Weight: 181 lb (82.1 kg)  Height: 5' 6 (1.676 m)  PainSc: 0-No pain   Body mass index is 29.21 kg/m.  Wt Readings from Last 3 Encounters:  05/11/24 181 lb (82.1 kg)  04/10/24 176 lb (79.8 kg)  01/08/24 193 lb (87.5  kg)    Objective:  Physical Exam Vitals and nursing note reviewed.  Constitutional:      General: She is not in acute distress.    Appearance: Normal appearance. She is well-developed. She is obese.  HENT:     Head: Normocephalic and atraumatic.     Right Ear: Hearing, tympanic membrane, ear canal and external ear normal. There is no impacted cerumen.     Left Ear: Hearing, tympanic membrane, ear canal and external ear normal. There is no impacted cerumen.     Nose: Nose normal.     Mouth/Throat:     Mouth: Mucous membranes are moist.  Eyes:     General: Lids are normal.     Extraocular Movements: Extraocular movements intact.  Conjunctiva/sclera: Conjunctivae normal.     Pupils: Pupils are equal, round, and reactive to light.     Funduscopic exam:    Right eye: No papilledema.        Left eye: No papilledema.  Neck:     Thyroid : No thyroid  mass.     Vascular: No carotid bruit.  Cardiovascular:     Rate and Rhythm: Normal rate and regular rhythm.     Pulses: Normal pulses.     Heart sounds: Normal heart sounds. No murmur heard. Pulmonary:     Effort: Pulmonary effort is normal. No respiratory distress.     Breath sounds: Normal breath sounds. No wheezing.  Chest:     Chest wall: No mass.  Breasts:    Tanner Score is 5.     Right: Normal. No mass or tenderness.     Left: Normal. No mass or tenderness.  Abdominal:     General: Abdomen is flat. Bowel sounds are normal. There is no distension.     Palpations: Abdomen is soft.     Tenderness: There is no abdominal tenderness.  Genitourinary:    Comments: Deferred - followed by GYN Musculoskeletal:        General: No swelling or tenderness.     Cervical back: Full passive range of motion without pain, normal range of motion and neck supple.     Right lower leg: No edema.     Left lower leg: No edema.  Lymphadenopathy:     Upper Body:     Right upper body: No supraclavicular, axillary or pectoral adenopathy.     Left  upper body: No supraclavicular, axillary or pectoral adenopathy.  Skin:    General: Skin is warm and dry.     Capillary Refill: Capillary refill takes less than 2 seconds.  Neurological:     General: No focal deficit present.     Mental Status: She is alert and oriented to person, place, and time.     Cranial Nerves: No cranial nerve deficit.     Sensory: No sensory deficit.     Motor: No weakness.  Psychiatric:        Mood and Affect: Mood normal.        Behavior: Behavior normal.        Thought Content: Thought content normal.        Judgment: Judgment normal.      Assessment And Plan:     Encounter for annual health examination Assessment & Plan: Behavior modifications discussed and diet history reviewed.   Pt will continue to exercise regularly and modify diet with low GI, plant based foods and decrease intake of processed foods.  Recommend intake of daily multivitamin, Vitamin D , and calcium.  Recommend mammogram and colonoscopy for preventive screenings, as well as recommend immunizations that include influenza, TDAP, and Shingles  Routine visit with weight decrease and well-controlled blood pressure. - Review lab results for cholesterol. - Discuss exercise goals of at least 150 minutes per week.   Type 2 diabetes mellitus with hyperlipidemia (HCC) Assessment & Plan: A1c of 7.2% in September indicates improved glycemic control. - Continue current management with empagliflozin  (Jardiance ).  Cholesterol levels improved significantly in February. - Order repeat cholesterol lab tests to monitor lipid levels.  Orders: -     EKG 12-Lead -     POCT URINALYSIS DIP (CLINITEK) -     Microalbumin / creatinine urine ratio -     CMP14+EGFR  Hypertensive heart disease without heart  failure Assessment & Plan: Blood pressure is controlled right at border for diastolic, continue current medications EKG done with no change from previous  Orders: -     EKG 12-Lead -     POCT  URINALYSIS DIP (CLINITEK) -     Microalbumin / creatinine urine ratio -     CMP14+EGFR  Mixed hyperlipidemia -     Lipid panel  Vitamin D  deficiency Assessment & Plan: Will check vitamin D  level and supplement as needed.    Also encouraged to spend 15 minutes in the sun daily.    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 Congratulated on her weight loss - Encouraged to maintain weight loss efforts.     Return for 1 year physical, controlled DM check 4 months. Patient was given opportunity to ask questions. Patient verbalized understanding of the plan and was able to repeat key elements of the plan. All questions were answered to their satisfaction.   Veronica Ada, Veronica Lynch  I, Veronica Ada, Veronica Lynch, have reviewed all documentation for this visit. The documentation on 05/11/24 for the exam, diagnosis, procedures, and orders are all accurate and complete.

## 2024-05-12 LAB — CMP14+EGFR
ALT: 12 IU/L (ref 0–32)
AST: 9 IU/L (ref 0–40)
Albumin: 4.2 g/dL (ref 3.8–4.9)
Alkaline Phosphatase: 81 IU/L (ref 49–135)
BUN/Creatinine Ratio: 13 (ref 9–23)
BUN: 12 mg/dL (ref 6–24)
Bilirubin Total: 0.4 mg/dL (ref 0.0–1.2)
CO2: 27 mmol/L (ref 20–29)
Calcium: 9.5 mg/dL (ref 8.7–10.2)
Chloride: 95 mmol/L — ABNORMAL LOW (ref 96–106)
Creatinine, Ser: 0.93 mg/dL (ref 0.57–1.00)
Globulin, Total: 2.9 g/dL (ref 1.5–4.5)
Glucose: 171 mg/dL — ABNORMAL HIGH (ref 70–99)
Potassium: 4.3 mmol/L (ref 3.5–5.2)
Sodium: 137 mmol/L (ref 134–144)
Total Protein: 7.1 g/dL (ref 6.0–8.5)
eGFR: 71 mL/min/1.73 (ref >59)

## 2024-05-12 LAB — MICROALBUMIN / CREATININE URINE RATIO
Creatinine, Urine: 40.3 mg/dL
Microalb/Creat Ratio: <7 mg/g{creat} (ref 0–29)
Microalbumin, Urine: <3 ug/mL

## 2024-05-12 LAB — LIPID PANEL
Chol/HDL Ratio: 4.9 ratio — ABNORMAL HIGH (ref 0.0–4.4)
Cholesterol, Total: 284 mg/dL — ABNORMAL HIGH (ref 100–199)
HDL: 58 mg/dL (ref >39)
LDL Chol Calc (NIH): 159 mg/dL — ABNORMAL HIGH (ref 0–99)
Triglycerides: 355 mg/dL — ABNORMAL HIGH (ref 0–149)
VLDL Cholesterol Cal: 67 mg/dL — ABNORMAL HIGH (ref 5–40)

## 2024-05-12 LAB — CBC WITH DIFFERENTIAL/PLATELET
Basophils Absolute: 0 x10E3/uL (ref 0.0–0.2)
Basos: 0 %
EOS (ABSOLUTE): 0.1 x10E3/uL (ref 0.0–0.4)
Eos: 2 %
Hematocrit: 38.1 % (ref 34.0–46.6)
Hemoglobin: 12.5 g/dL (ref 11.1–15.9)
Immature Grans (Abs): 0 x10E3/uL (ref 0.0–0.1)
Immature Granulocytes: 0 %
Lymphocytes Absolute: 1.8 x10E3/uL (ref 0.7–3.1)
Lymphs: 32 %
MCH: 28.7 pg (ref 26.6–33.0)
MCHC: 32.8 g/dL (ref 31.5–35.7)
MCV: 87 fL (ref 79–97)
Monocytes Absolute: 0.5 x10E3/uL (ref 0.1–0.9)
Monocytes: 8 %
Neutrophils Absolute: 3.4 x10E3/uL (ref 1.4–7.0)
Neutrophils: 58 %
Platelets: 263 x10E3/uL (ref 150–450)
RBC: 4.36 x10E6/uL (ref 3.77–5.28)
RDW: 12.6 % (ref 11.7–15.4)
WBC: 5.7 x10E3/uL (ref 3.4–10.8)

## 2024-05-12 LAB — VITAMIN D 25 HYDROXY (VIT D DEFICIENCY, FRACTURES): Vit D, 25-Hydroxy: 22.6 ng/mL — ABNORMAL LOW (ref 30.0–100.0)

## 2024-05-12 LAB — HEPATITIS B SURFACE ANTIBODY,QUALITATIVE: Hep B Surface Ab, Qual: NONREACTIVE

## 2024-05-13 ENCOUNTER — Other Ambulatory Visit: Payer: Self-pay | Admitting: Cardiology

## 2024-05-13 DIAGNOSIS — I25118 Atherosclerotic heart disease of native coronary artery with other forms of angina pectoris: Secondary | ICD-10-CM

## 2024-05-14 MED ORDER — METOPROLOL SUCCINATE ER 50 MG PO TB24: 50.0000 mg | ORAL_TABLET | Freq: Every day | ORAL | 0 refills | Status: DC

## 2024-05-18 ENCOUNTER — Encounter: Payer: Self-pay | Admitting: Radiology

## 2024-06-09 ENCOUNTER — Other Ambulatory Visit: Payer: Self-pay | Admitting: Cardiology

## 2024-06-09 DIAGNOSIS — I25118 Atherosclerotic heart disease of native coronary artery with other forms of angina pectoris: Secondary | ICD-10-CM

## 2024-07-01 ENCOUNTER — Other Ambulatory Visit: Payer: Self-pay | Admitting: Cardiology

## 2024-07-01 DIAGNOSIS — I25118 Atherosclerotic heart disease of native coronary artery with other forms of angina pectoris: Secondary | ICD-10-CM

## 2024-09-14 ENCOUNTER — Ambulatory Visit: Payer: Self-pay | Admitting: Nurse Practitioner

## 2024-09-18 ENCOUNTER — Ambulatory Visit: Admitting: Cardiology

## 2024-10-09 ENCOUNTER — Ambulatory Visit: Admitting: Internal Medicine

## 2025-05-12 ENCOUNTER — Encounter: Payer: Self-pay | Admitting: Nurse Practitioner
